# Patient Record
Sex: Female | Born: 1960 | Race: White | Hispanic: No | Smoking: Former smoker
Health system: Southern US, Community
[De-identification: ages and names within clinical notes are randomized; demographics above are authoritative.]

## PROBLEM LIST (undated history)

## (undated) DIAGNOSIS — D0591 Unspecified type of carcinoma in situ of right breast: Secondary | ICD-10-CM

## (undated) DIAGNOSIS — Z803 Family history of malignant neoplasm of breast: Secondary | ICD-10-CM

## (undated) DIAGNOSIS — B182 Chronic viral hepatitis C: Secondary | ICD-10-CM

## (undated) DIAGNOSIS — E119 Type 2 diabetes mellitus without complications: Secondary | ICD-10-CM

## (undated) DIAGNOSIS — I739 Peripheral vascular disease, unspecified: Secondary | ICD-10-CM

## (undated) DIAGNOSIS — Z808 Family history of malignant neoplasm of other organs or systems: Secondary | ICD-10-CM

## (undated) DIAGNOSIS — Z8042 Family history of malignant neoplasm of prostate: Secondary | ICD-10-CM

## (undated) DIAGNOSIS — M5430 Sciatica, unspecified side: Secondary | ICD-10-CM

## (undated) DIAGNOSIS — Z8619 Personal history of other infectious and parasitic diseases: Secondary | ICD-10-CM

## (undated) HISTORY — DX: Peripheral vascular disease, unspecified: I73.9

## (undated) HISTORY — PX: INCISION AND DRAINAGE ABSCESS: SHX5864

## (undated) HISTORY — DX: Family history of malignant neoplasm of breast: Z80.3

## (undated) HISTORY — DX: Family history of malignant neoplasm of prostate: Z80.42

## (undated) HISTORY — DX: Family history of malignant neoplasm of other organs or systems: Z80.8

---

## 2005-10-18 ENCOUNTER — Emergency Department (HOSPITAL_COMMUNITY): Admission: EM | Admit: 2005-10-18 | Discharge: 2005-10-18 | Payer: Self-pay | Admitting: Emergency Medicine

## 2007-12-30 ENCOUNTER — Emergency Department (HOSPITAL_COMMUNITY): Admission: EM | Admit: 2007-12-30 | Discharge: 2007-12-30 | Payer: Self-pay | Admitting: Emergency Medicine

## 2008-03-22 ENCOUNTER — Emergency Department (HOSPITAL_COMMUNITY): Admission: EM | Admit: 2008-03-22 | Discharge: 2008-03-22 | Payer: Self-pay | Admitting: Emergency Medicine

## 2008-04-20 ENCOUNTER — Emergency Department (HOSPITAL_COMMUNITY): Admission: EM | Admit: 2008-04-20 | Discharge: 2008-04-22 | Payer: Self-pay | Admitting: Emergency Medicine

## 2009-01-30 ENCOUNTER — Emergency Department (HOSPITAL_COMMUNITY): Admission: EM | Admit: 2009-01-30 | Discharge: 2009-01-30 | Payer: Self-pay | Admitting: Emergency Medicine

## 2010-08-18 ENCOUNTER — Encounter (HOSPITAL_COMMUNITY): Payer: Self-pay

## 2010-08-18 ENCOUNTER — Emergency Department (HOSPITAL_COMMUNITY): Admit: 2010-08-18 | Discharge: 2010-08-18 | Disposition: A | Discharge status: Home / Self Care | Payer: Self-pay

## 2010-08-18 ENCOUNTER — Emergency Department (HOSPITAL_COMMUNITY)
Admission: EM | Admit: 2010-08-18 | Discharge: 2010-08-18 | Disposition: A | Discharge status: Home / Self Care | Payer: Self-pay | Attending: Emergency Medicine | Admitting: Emergency Medicine

## 2010-08-18 DIAGNOSIS — M545 Low back pain, unspecified: Secondary | ICD-10-CM | POA: Insufficient documentation

## 2010-08-18 DIAGNOSIS — N390 Urinary tract infection, site not specified: Secondary | ICD-10-CM | POA: Insufficient documentation

## 2010-08-18 DIAGNOSIS — F17200 Nicotine dependence, unspecified, uncomplicated: Secondary | ICD-10-CM | POA: Insufficient documentation

## 2010-08-18 LAB — URINALYSIS, ROUTINE W REFLEX MICROSCOPIC
Nitrite: NEGATIVE
Protein, ur: NEGATIVE mg/dL
Specific Gravity, Urine: 1.03 (ref 1.005–1.030)
Urobilinogen, UA: 1 mg/dL (ref 0.0–1.0)

## 2010-08-18 LAB — URINE MICROSCOPIC-ADD ON

## 2010-09-23 ENCOUNTER — Emergency Department (HOSPITAL_COMMUNITY): Payer: Self-pay

## 2010-09-23 ENCOUNTER — Observation Stay (HOSPITAL_COMMUNITY)
Admission: EM | Admit: 2010-09-23 | Discharge: 2010-09-23 | Disposition: A | Discharge status: Home / Self Care | Payer: Self-pay | Attending: Orthopedic Surgery | Admitting: Orthopedic Surgery

## 2010-09-23 DIAGNOSIS — F1410 Cocaine abuse, uncomplicated: Secondary | ICD-10-CM | POA: Insufficient documentation

## 2010-09-23 DIAGNOSIS — Z23 Encounter for immunization: Secondary | ICD-10-CM | POA: Insufficient documentation

## 2010-09-23 DIAGNOSIS — M545 Low back pain, unspecified: Secondary | ICD-10-CM | POA: Insufficient documentation

## 2010-09-23 DIAGNOSIS — F17200 Nicotine dependence, unspecified, uncomplicated: Secondary | ICD-10-CM | POA: Insufficient documentation

## 2010-09-23 DIAGNOSIS — L03119 Cellulitis of unspecified part of limb: Secondary | ICD-10-CM | POA: Insufficient documentation

## 2010-09-23 DIAGNOSIS — G8929 Other chronic pain: Secondary | ICD-10-CM | POA: Insufficient documentation

## 2010-09-23 DIAGNOSIS — L02519 Cutaneous abscess of unspecified hand: Principal | ICD-10-CM | POA: Insufficient documentation

## 2010-09-23 LAB — CBC
MCH: 32.3 pg (ref 26.0–34.0)
MCH: 32 pg (ref 26.0–34.0)
MCHC: 34.2 g/dL (ref 30.0–36.0)
MCV: 94.4 fL (ref 78.0–100.0)
MCV: 94.3 fL (ref 78.0–100.0)
Platelets: 152 10*3/uL (ref 150–400)
Platelets: 164 10*3/uL (ref 150–400)
RBC: 4.64 MIL/uL (ref 3.87–5.11)
RDW: 12.5 % (ref 11.5–15.5)
WBC: 8.6 10*3/uL (ref 4.0–10.5)

## 2010-09-23 LAB — BASIC METABOLIC PANEL
BUN: 5 mg/dL — ABNORMAL LOW (ref 6–23)
CO2: 30 mEq/L (ref 19–32)
Calcium: 8.5 mg/dL (ref 8.4–10.5)
Chloride: 98 mEq/L (ref 96–112)
Creatinine, Ser: 0.59 mg/dL (ref 0.4–1.2)

## 2010-09-23 LAB — DIFFERENTIAL
Eosinophils Absolute: 0.1 10*3/uL (ref 0.0–0.7)
Lymphs Abs: 3 10*3/uL (ref 0.7–4.0)
Monocytes Absolute: 1 10*3/uL (ref 0.1–1.0)
Monocytes Relative: 11 % (ref 3–12)
Neutrophils Relative %: 54 % (ref 43–77)

## 2010-09-25 NOTE — H&P (Signed)
NAME:  Martha Lozano, Martha Lozano NO.:  [id number]  MEDICAL RECORD NO.:  [id number]           PATIENT TYPE:  I  LOCATION:  5024                         FACILITY:  [facility]  PHYSICIAN:  Betha Loa, MD        DATE OF BIRTH:  [birth year]  DATE OF ADMISSION:  09/23/2010 DATE OF DISCHARGE:                             HISTORY & PHYSICAL   CHIEF COMPLAINT:  Left hand pain.  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  She began to have swelling in the hand on September 21, 2010, and began having more and more pain in her left hand approximately 12 hours ago.  She came to the [facility] for evaluation.  She was felt to have an abscess of the hand and I was consulted for management of the injury.  She reports no fevers, chills, or night sweats.  She states she does clean the needle and her skinprior to using IV drugs.  The pain is mostly in the dorsum of the left hand and the location where she had been injecting IV drugs.  ALLERGIES:  No known drug allergies.  PAST MEDICAL HISTORY:  Hepatitis C.  PAST SURGERIES:  None.  FAMILY HISTORY:  Noncontributory.  SOCIAL HISTORY:  Martha Lozano is not employed.  She smokes one-half pack per day x20 years and drinks approximately of 12-pack of beer per week.  MEDICATIONS:  None.  REVIEW OF SYSTEMS:  Negative.  PHYSICAL EXAMINATION:  VITAL SIGNS:  Temperature 98.5, pulse 101, respirations 16, BP 158/99. GENERAL:  Alert and oriented x3. HEAD:  Normocephalic, atraumatic. NECK:  Supple.  Full range of motion. CHEST:  Regular rate and rhythm. LUNGS:  Clear to auscultation bilaterally. ABDOMEN:  Nontender, nondistended. EXTREMITIES:  Bilateral upper extremities are distally and neurovascularly intact in radial, median, and ulnar nerve distributions. She has intact sensation and capillary refill in all  fingertips.  She can flex and extend the IP joint of her thumb across her fingers.  The right upper extremity is without wounds and without tenderness to palpation.  Left upper extremity, she has swelling in the hand.  She is able to move her fingers.  She is tender to palpation on the dorsum of the hand on the ulnar side in the area where she was injecting.  There is fluctuance.  There is mild redness.  She has swelling in the entire dorsum of the hand and somewhat into the fingers.  She can wiggle her fingers.  She is not very tender in her fingers or the palm of the hand. There is no active drainage.  No open wound.  There are track marks.  RADIOGRAPHS:  AP, lateral, and oblique views of left hand show soft tissue swelling, but no fractures, dislocations, or radiopaque foreign bodies.  LAB RESULTS:  White blood count 9.0, hemoglobin 15.0, hematocrit 43.8, platelets 152.  ASSESSMENT/PLAN:  Left hand soft tissue abscess.  Discussed with Martha Lozano the nature of this condition.  I recommended going to the operating room for  irrigation and debridement of the soft tissue abscess and admission for IV antibiotics.  We discussed the wound will be left open and would have packing in it.  It would require hydrotherapy and packing changes.  Risks, benefits, and alternatives of surgery were discussed including risk of blood loss, infection, damage to nerves, vessels, tendons, ligaments, bone, failure of procedure, need for additional procedures, complications with wound healing, continued pain, continued infection, need for repeat irrigation and debridement.  She voiced understanding of these risks and elected to sign surgical consent.  We will have surgery as soon as possible.     Betha Loa, MD     KK/MEDQ  D:  09/23/2010  T:  09/23/2010  Job:  [id number]  Electronically Signed by Betha Loa  on 09/25/2010 05:42:08 PM

## 2010-09-25 NOTE — Discharge Summary (Signed)
  NAME:  Martha Lozano, Martha Lozano NO.:  [id number]  MEDICAL RECORD NO.:  [id number]           PATIENT TYPE:  I  LOCATION:  5024                         FACILITY:  [facility]  PHYSICIAN:  Betha Loa, MD        DATE OF BIRTH:  Sep 08, 1960  DATE OF ADMISSION:  09/23/2010 DATE OF DISCHARGE:  09/23/2010                              DISCHARGE SUMMARY   FINAL DIAGNOSIS:  Left hand abscess.  PROCEDURES:  Left hand irrigation and debridement.  HISTORY:  [name] is a 50 year old right-hand-dominant white female who is an IV heroin user.  She states she was using on Wednesday. She began to have pain and swelling in the left hand on September 21, 2010 and September 22, 2010.  She had continued pain and swelling and presented to the emergency department late on September 22, 2010.  She was felt to have an abscess of the hand.  Was consulted for management of the condition. She was noted have a fluctuant mass in the dorsum of the hand.  There was mild erythema and significant tenderness to palpation.  Recommended going to the operating room for irrigation and debridement of the left hand.  She also had some erythema around some track marks on the right forearm, but there was no abscess noted.  Risks, benefits, alternatives of surgery were discussed and she wished to proceed.  HOSPITAL COURSE:  [name] was taken to the operating room in the early morning hours of September 23, 2010.  Irrigation and debridement of the left dorsal hand abscess was performed.  Purulence was expressed. Cultures were taken.  She tolerated the procedure well.  She was kept overnight for IV antibiotics and continued to improve.  She was afebrile. Her white blood count went from 9.0 to 8.6.  The erythema around the track marks on the right forearm entirely resolved.  She had decreased swelling and tenderness of the left hand.  She had no tenderness volarly and very mild tenderness dorsally.  It was felt she was safe  to discharge home on oral antibiotics.  She will follow up with me in 2 days for further wound care.  She will be given Percocet 5/325 one to two p.o. q.6 h. p.r.n. pain dispensed #50, and Bactrim DS one p.o. b.i.d. x10 days.     Betha Loa, MD     KK/MEDQ  D:  09/23/2010  T:  09/24/2010  Job:  [id number]  Electronically Signed by Betha Loa  on 09/25/2010 05:43:35 PM

## 2010-09-25 NOTE — Op Note (Signed)
NAME:  Martha, Lozano NO.:  [id number]  MEDICAL RECORD NO.:  [id number]           PATIENT TYPE:  I  LOCATION:  5024                         FACILITY:  [facility]  PHYSICIAN:  Betha Loa, MD        DATE OF BIRTH:  [birth year]  DATE OF PROCEDURE: DATE OF DISCHARGE:                              OPERATIVE REPORT   PREOPERATIVE DIAGNOSIS:  Left hand abscess.  POSTOPERATIVE DIAGNOSIS:  Left hand abscess.  PROCEDURE:  Irrigation and debridement, left hand, dorsal abscess.  SURGEON:  Betha Loa, MD.  ASSISTANT:  None.  ANESTHESIA:  General.  IV FLUIDS:  Per anesthesia flow sheet.  ESTIMATED BLOOD LOSS:  Minimal.  COMPLICATIONS:  None.  SPECIMENS:  Cultures to micro.  Tourniquet time approximately 28 minutes.  DISPOSITION:  Stable to PACU.  INDICATIONS:  Martha Lozano is a 50 year old right-hand-dominant white female who states she was injecting IV heroin in her left hand on Wednesday.  She began to have pain and swelling in her left hand.  She presented to [facility] Emergency Department late in the evening of March 9.  She was evaluated and felt to have an abscess of the hand.  I was consulted for management of condition.  She reports no fevers, chills, night sweats.  She had a palpable fluctuant area on the dorsum of the hand.  It was minimally erythematous.  The hand was swollen throughout.  She was not tender volarly in the hand.  I recommended Ms. Altland going to the operating room for irrigation debridement, left hand.  Risks, benefits, and alternatives of surgery were discussed including risk of blood loss, infection, damage to nerves, vessels, tendons, ligaments bone, failure to surgery, need for additional surgery, complications with wound healing, continued pain, continued infection, need for repeat irrigation and debridement.  She voiced understanding of these risks and elected to proceed.  OPERATIVE COURSE:  After being identified  preoperatively by myself, the patient agreed upon procedure and site procedure.  Surgery site was marked.  The risks, benefits, and alternatives of surgery were reviewed and she wished to proceed.  Surgical consent was signed.  She was transferred to the operating room, placed on the operating table in supine position with left upper extremity on arm board.  General anesthesia was induced by the anesthesiologist.  Left upper extremity was prepped and draped in normal sterile orthopedic fashion.  A surgical pause was performed between surgeons, Anesthesia, and operating staff, and all were in agreement with the patient procedure and site procedure. Tourniquet proximal aspect of the extremities inflated to 250 mmHg after gravity exsanguination of the limb.  Incision was made in the dorsum of the hand between the long and ring finger metacarpals.  This carried into subcutaneous tissues by spreading technique.  Bipolar electrocautery was used throughout the case to obtain hemostasis.  Gross purulence was expressed.  Cultures were taken for aerobes and anaerobes. The wound was spread open.  The tendons were identified and were intact. The wound was copiously irrigated with 4000 mL of sterile saline.  It was felt that all aspects of the abscess pocket had been cleared.  The area underneath the tendons did not have any active abscess in it.  The fascia over the metacarpals was intact and there was no bulging.  Once adequate debridement and irrigation had been obtained, the wound was packed with 0.25 inch iodoform packing.  A 3 mL of 0.25% plain Marcaine were used in the skin edges for postoperative analgesia.  The wound was dressed with sterile Xeroform, 4x4s, and wrapped with Kerlix.  A volar splint was placed and wrapped with Kerlix and Ace bandage.  Tourniquet was deflated to approximately 28 minutes.  The fingertips were pink with brisk capillary refill after deflation of tourniquet.  The  upper drapes were broken down.  The patient was awoken from anesthesia safely.  She was transferred back to stretcher and taken to PACU in stable condition. We will keep her overnight for IV antibiotics.  I will see her tomorrow for evaluation.  We will plan to start whirlpool and packing changes in the next couple of days.     Betha Loa, MD     KK/MEDQ  D:  09/23/2010  T:  09/23/2010  Job:  [id number]  Electronically Signed by Betha Loa  on 09/25/2010 05:43:02 PM

## 2010-09-26 LAB — CULTURE, ROUTINE-ABSCESS

## 2010-09-28 LAB — ANAEROBIC CULTURE

## 2010-10-22 LAB — URINALYSIS, ROUTINE W REFLEX MICROSCOPIC
Ketones, ur: NEGATIVE mg/dL
Leukocytes, UA: NEGATIVE
Nitrite: NEGATIVE
Protein, ur: NEGATIVE mg/dL
Urobilinogen, UA: 0.2 mg/dL (ref 0.0–1.0)

## 2010-10-22 LAB — CBC
MCHC: 35.1 g/dL (ref 30.0–36.0)
RBC: 4.89 MIL/uL (ref 3.87–5.11)

## 2010-10-22 LAB — BASIC METABOLIC PANEL
CO2: 31 mEq/L (ref 19–32)
Calcium: 9.2 mg/dL (ref 8.4–10.5)
Creatinine, Ser: 0.64 mg/dL (ref 0.4–1.2)
GFR calc Af Amer: >60 mL/min (ref >60)

## 2010-10-22 LAB — DIFFERENTIAL
Basophils Absolute: 0 10*3/uL (ref 0.0–0.1)
Basophils Relative: 0 % (ref 0–1)
Monocytes Relative: 9 % (ref 3–12)
Neutro Abs: 4.7 10*3/uL (ref 1.7–7.7)
Neutrophils Relative %: 49 % (ref 43–77)

## 2010-10-22 LAB — URINE CULTURE: Colony Count: 30000

## 2010-12-26 ENCOUNTER — Emergency Department (HOSPITAL_COMMUNITY)
Admission: EM | Admit: 2010-12-26 | Discharge: 2010-12-27 | Disposition: A | Discharge status: Home / Self Care | Payer: Self-pay | Attending: Emergency Medicine | Admitting: Emergency Medicine

## 2010-12-26 DIAGNOSIS — B1920 Unspecified viral hepatitis C without hepatic coma: Secondary | ICD-10-CM | POA: Insufficient documentation

## 2010-12-26 DIAGNOSIS — IMO0002 Reserved for concepts with insufficient information to code with codable children: Secondary | ICD-10-CM | POA: Insufficient documentation

## 2010-12-28 ENCOUNTER — Emergency Department (HOSPITAL_COMMUNITY)
Admission: EM | Admit: 2010-12-28 | Discharge: 2010-12-28 | Disposition: A | Discharge status: Home / Self Care | Payer: Self-pay | Attending: Emergency Medicine | Admitting: Emergency Medicine

## 2010-12-28 DIAGNOSIS — Z79899 Other long term (current) drug therapy: Secondary | ICD-10-CM | POA: Insufficient documentation

## 2010-12-28 DIAGNOSIS — Z8619 Personal history of other infectious and parasitic diseases: Secondary | ICD-10-CM | POA: Insufficient documentation

## 2010-12-28 DIAGNOSIS — IMO0002 Reserved for concepts with insufficient information to code with codable children: Secondary | ICD-10-CM | POA: Insufficient documentation

## 2010-12-28 DIAGNOSIS — Z4800 Encounter for change or removal of nonsurgical wound dressing: Secondary | ICD-10-CM | POA: Insufficient documentation

## 2010-12-30 LAB — CULTURE, ROUTINE-ABSCESS

## 2011-01-02 LAB — CULTURE, BLOOD (ROUTINE X 2)
Culture  Setup Time: 201206130359
Culture: NO GROWTH

## 2011-04-11 ENCOUNTER — Inpatient Hospital Stay (INDEPENDENT_AMBULATORY_CARE_PROVIDER_SITE_OTHER)
Admission: RE | Admit: 2011-04-11 | Discharge: 2011-04-11 | Disposition: A | Discharge status: Home / Self Care | Payer: Self-pay | Source: Ambulatory Visit | Attending: Family Medicine | Admitting: Family Medicine

## 2011-04-11 DIAGNOSIS — M545 Low back pain: Secondary | ICD-10-CM

## 2011-04-11 DIAGNOSIS — J40 Bronchitis, not specified as acute or chronic: Secondary | ICD-10-CM

## 2011-04-11 LAB — POCT URINALYSIS DIP (DEVICE)
Nitrite: NEGATIVE
Protein, ur: NEGATIVE mg/dL
Urobilinogen, UA: 2 mg/dL — ABNORMAL HIGH (ref 0.0–1.0)
pH: 6.5 (ref 5.0–8.0)

## 2011-04-12 LAB — GC/CHLAMYDIA PROBE AMP, GENITAL: GC Probe Amp, Genital: NEGATIVE

## 2011-07-06 IMAGING — CR DG CHEST 2V
2 series · 2 of 2 positions shown · non-contrast
Comparison: None.

CLINICAL DATA: Left hand abscess

CHEST - 2 VIEW

[w chest pa]
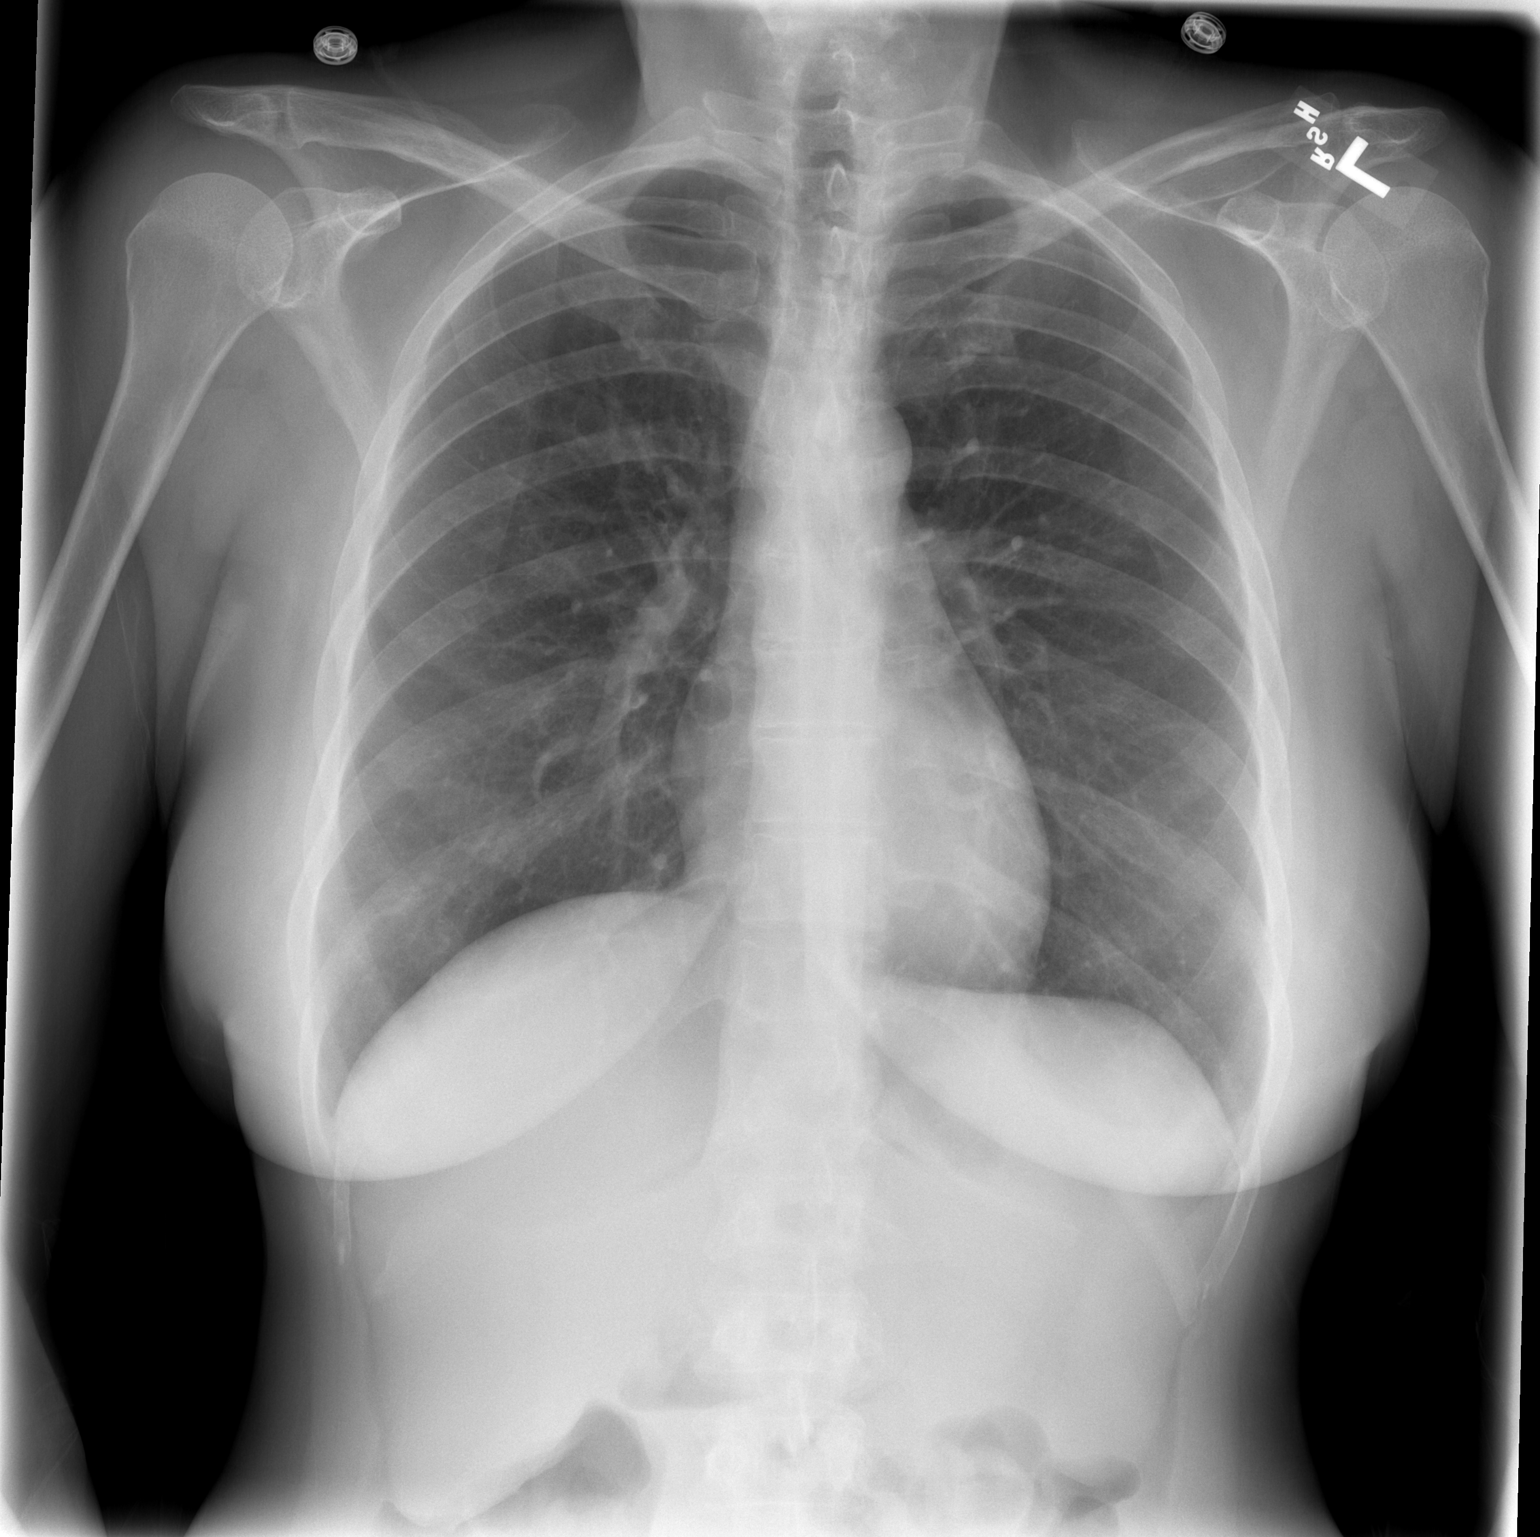

[w chest lat]
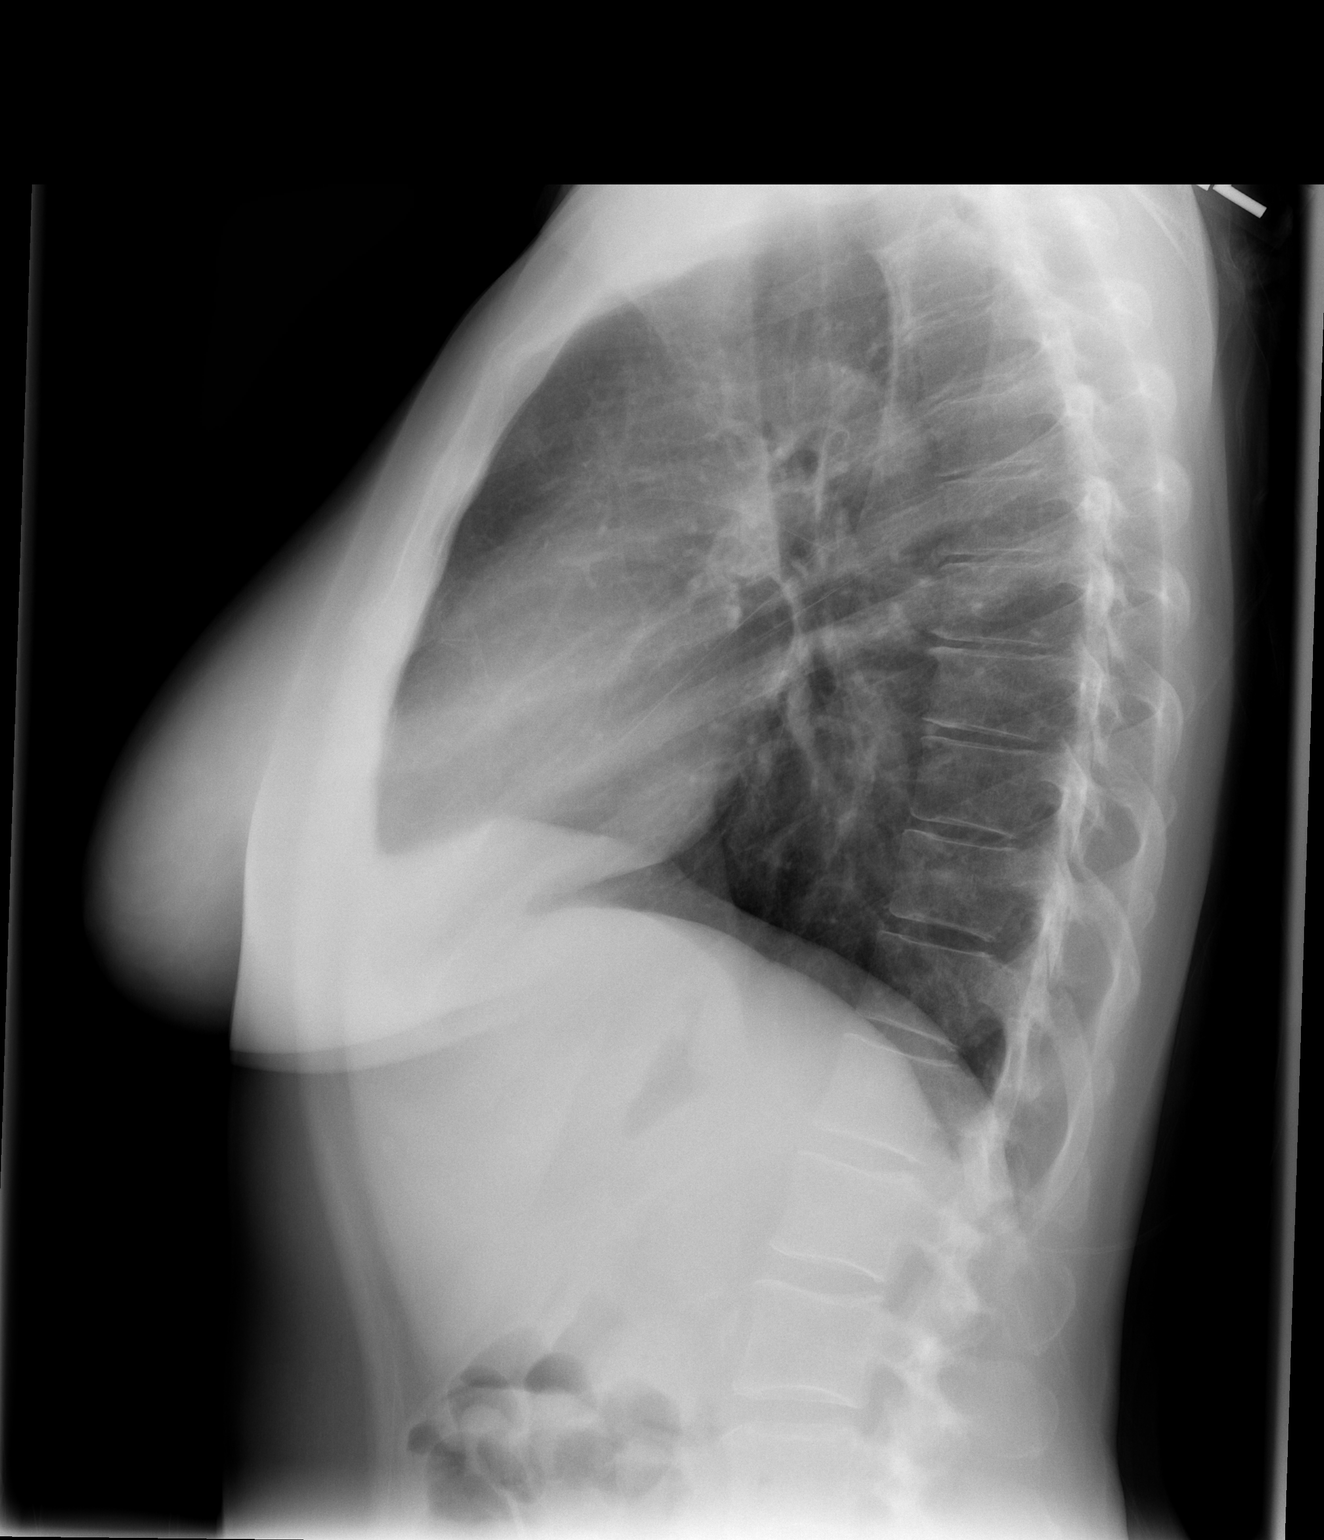

[2 of 2 positions shown; findings below may reference images not displayed]

FINDINGS: Normal mediastinum and cardiac silhouette.  Normal
pulmonary  vasculature.  No evidence of effusion, infiltrate, or
pneumothorax.  No acute bony abnormality.
IMPRESSION: Normal chest radiograph

## 2011-07-06 IMAGING — CR DG HAND COMPLETE 3+V*L*
3 series · 3 of 3 positions shown · non-contrast
Comparison: None.

CLINICAL DATA: Left hand abscess

LEFT HAND - COMPLETE 3+ VIEW

[x hand pa left]
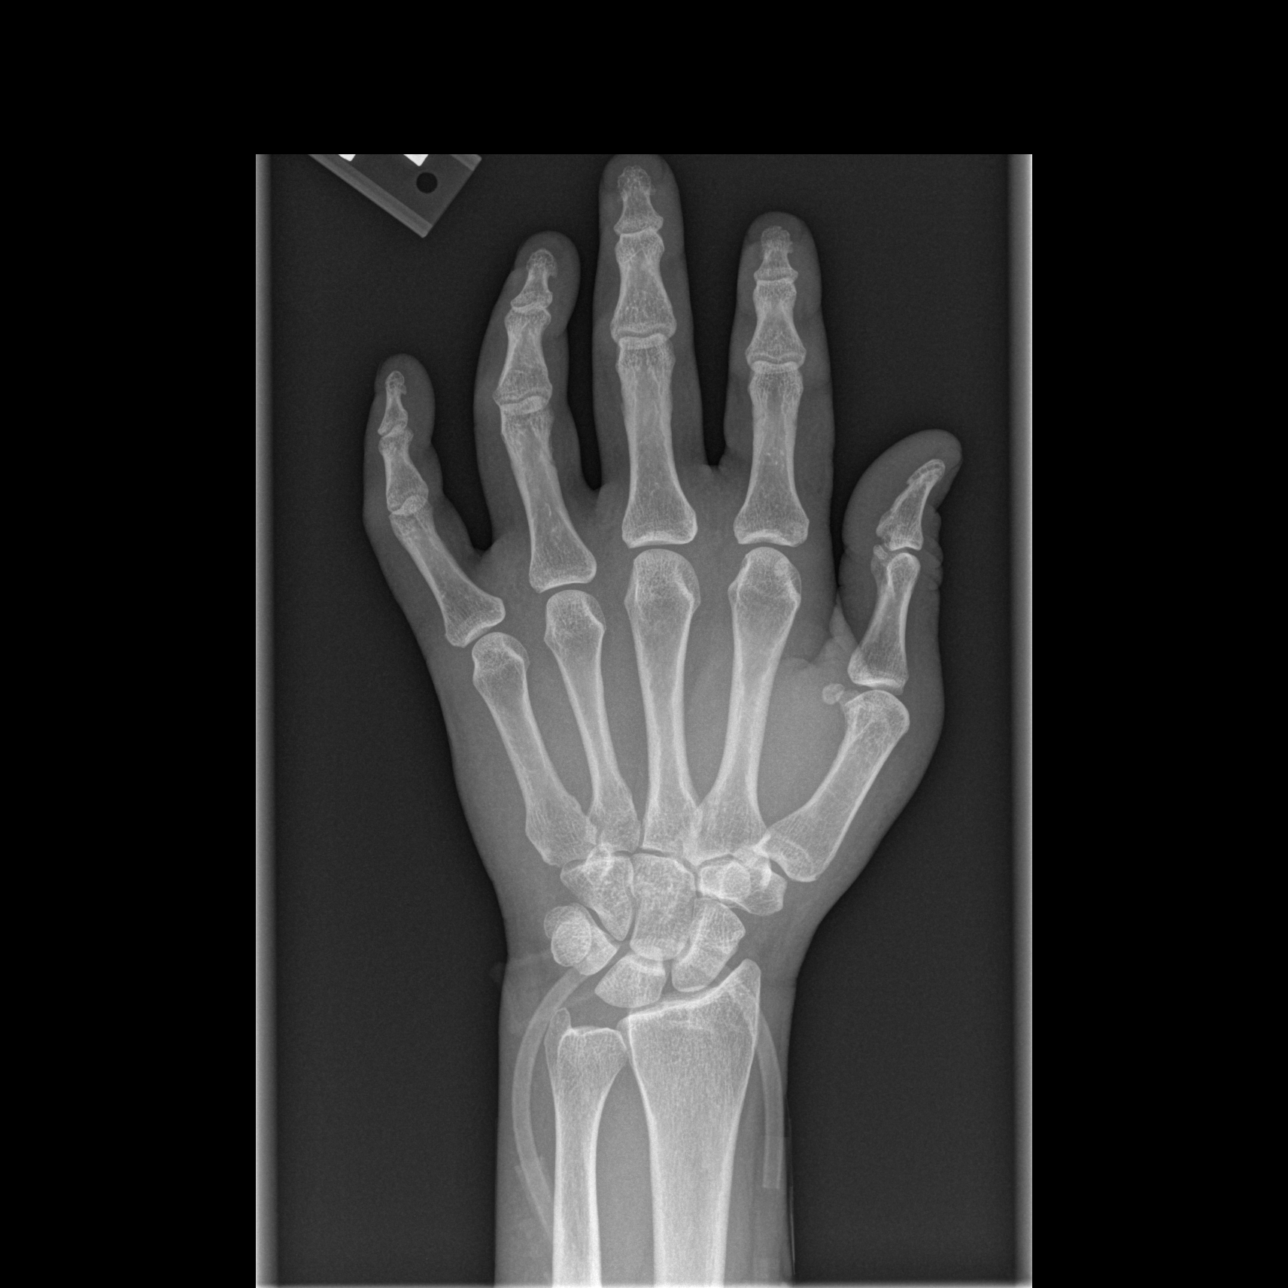

[x hand oblique left]
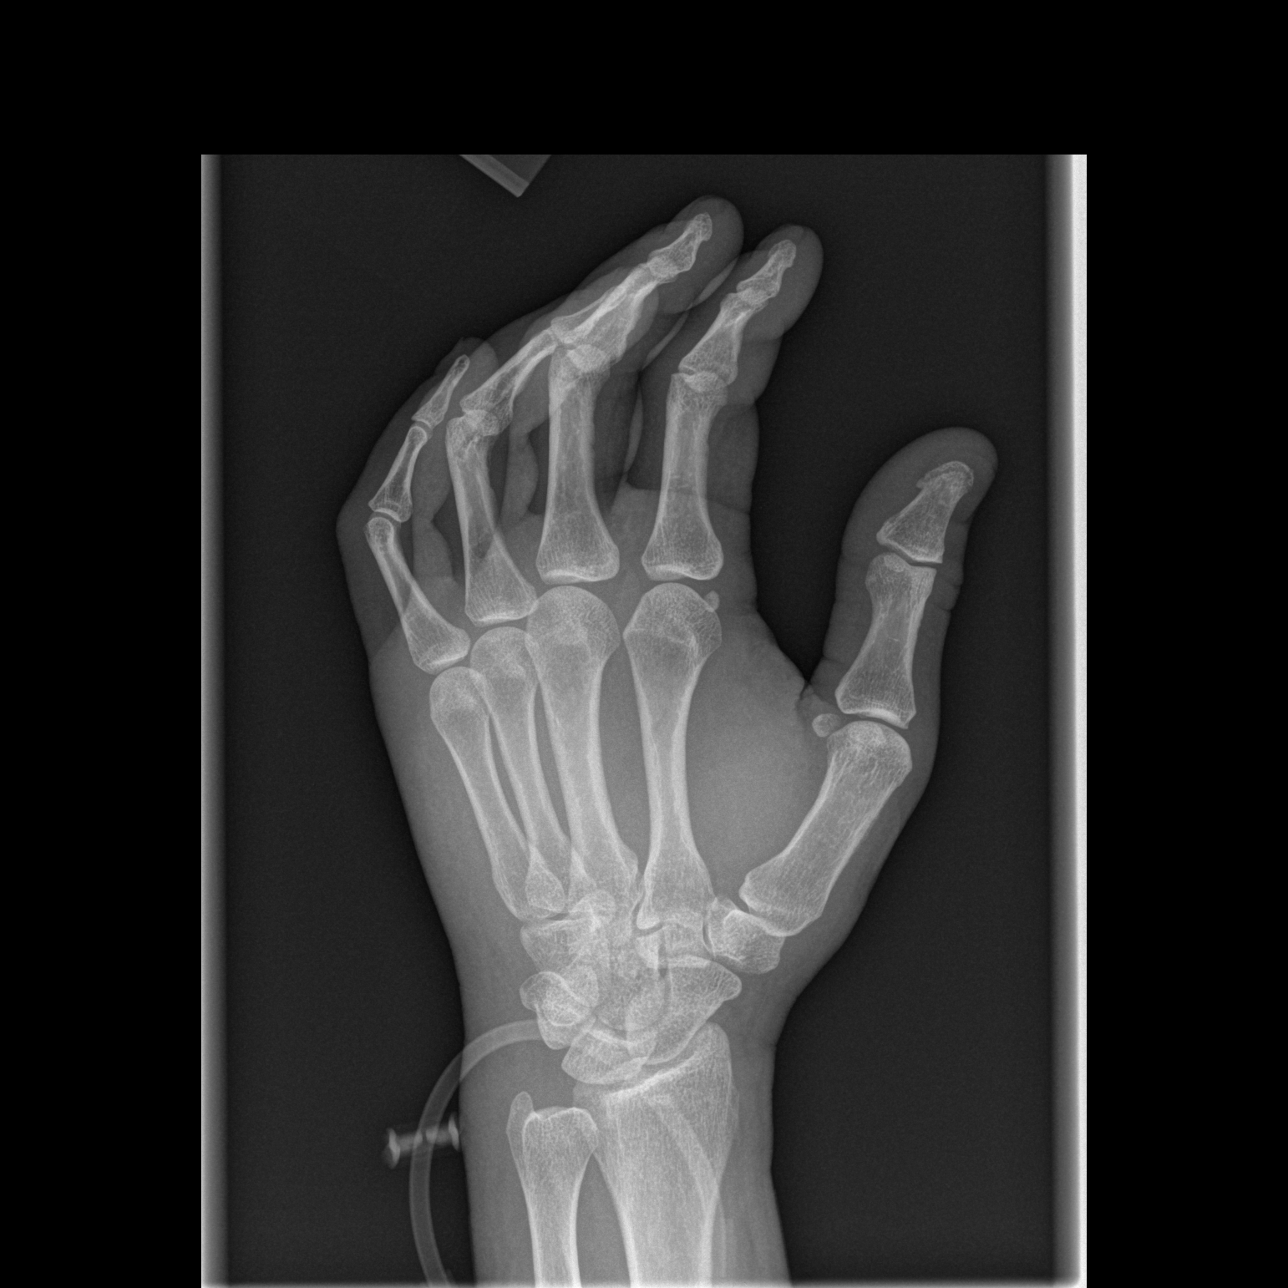

[x hand lat left]
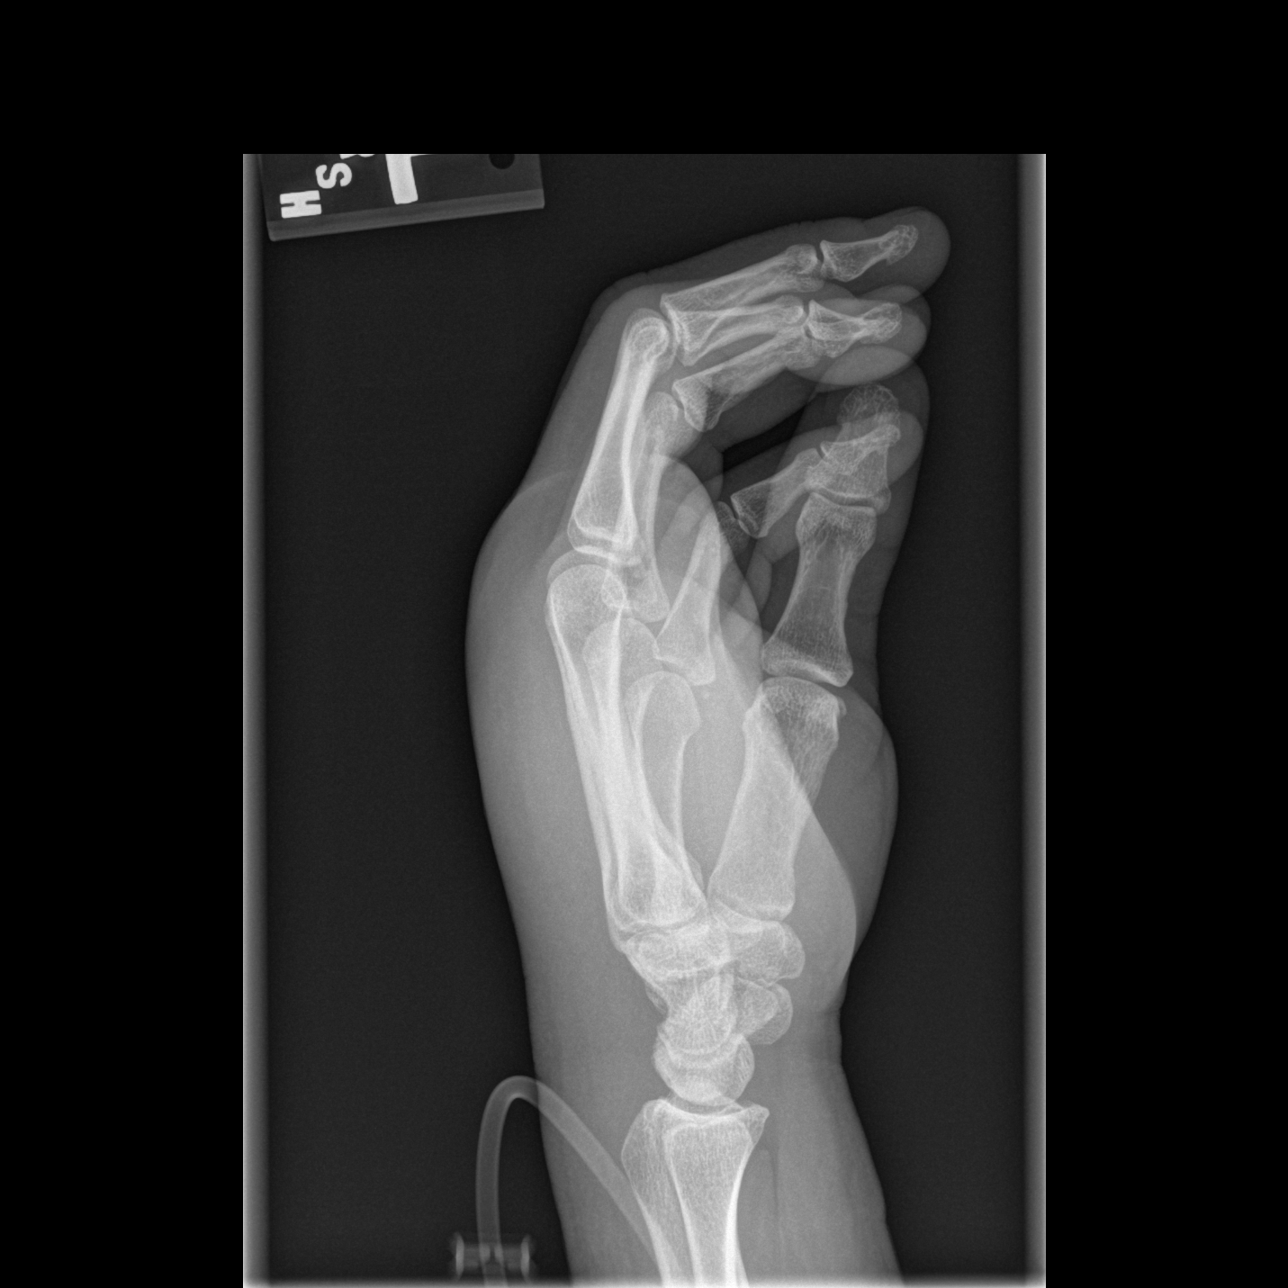

[3 of 3 positions shown; findings below may reference images not displayed]

FINDINGS: No osseous abnormality.  No radiodense foreign body.
There is soft tissue swelling of the dorsum of the hand
IMPRESSION: Soft tissue swelling of the dorsum of the hand.

## 2011-09-27 ENCOUNTER — Emergency Department (HOSPITAL_COMMUNITY)
Admission: EM | Admit: 2011-09-27 | Discharge: 2011-09-27 | Disposition: A | Discharge status: Home / Self Care | Payer: Self-pay | Attending: Emergency Medicine | Admitting: Emergency Medicine

## 2011-09-27 ENCOUNTER — Encounter (HOSPITAL_COMMUNITY): Payer: Self-pay

## 2011-09-27 DIAGNOSIS — G8929 Other chronic pain: Secondary | ICD-10-CM | POA: Insufficient documentation

## 2011-09-27 DIAGNOSIS — W57XXXA Bitten or stung by nonvenomous insect and other nonvenomous arthropods, initial encounter: Secondary | ICD-10-CM

## 2011-09-27 DIAGNOSIS — B182 Chronic viral hepatitis C: Secondary | ICD-10-CM | POA: Insufficient documentation

## 2011-09-27 DIAGNOSIS — M545 Low back pain, unspecified: Secondary | ICD-10-CM | POA: Insufficient documentation

## 2011-09-27 DIAGNOSIS — F17200 Nicotine dependence, unspecified, uncomplicated: Secondary | ICD-10-CM | POA: Insufficient documentation

## 2011-09-27 DIAGNOSIS — R21 Rash and other nonspecific skin eruption: Secondary | ICD-10-CM | POA: Insufficient documentation

## 2011-09-27 DIAGNOSIS — IMO0001 Reserved for inherently not codable concepts without codable children: Secondary | ICD-10-CM | POA: Insufficient documentation

## 2011-09-27 DIAGNOSIS — M5380 Other specified dorsopathies, site unspecified: Secondary | ICD-10-CM | POA: Insufficient documentation

## 2011-09-27 HISTORY — DX: Chronic viral hepatitis C: B18.2

## 2011-09-27 MED ORDER — CYCLOBENZAPRINE HCL 10 MG PO TABS: 10.0000 mg | ORAL_TABLET | Freq: Two times a day (BID) | ORAL | Status: AC | PRN

## 2011-09-27 MED ORDER — HYDROCODONE-ACETAMINOPHEN 5-325 MG PO TABS: 1.0000 | ORAL_TABLET | Freq: Four times a day (QID) | ORAL | Status: AC | PRN

## 2011-09-27 MED ORDER — PERMETHRIN 5 % EX CREA: TOPICAL_CREAM | CUTANEOUS | Status: AC

## 2011-09-27 MED ORDER — HYDROCODONE-ACETAMINOPHEN 5-325 MG PO TABS: 1.0000 | ORAL_TABLET | Freq: Once | ORAL | Status: DC

## 2011-09-27 NOTE — ED Provider Notes (Signed)
History     CSN: [id number]  Arrival date & time 09/27/11  4098   First MD Initiated Contact with Patient 09/27/11 0919      Chief Complaint  Patient presents with  . Back Pain    (Consider location/radiation/quality/duration/timing/severity/associated sxs/prior treatment) HPI  Patient presents to the emergency department with a flareup of chronic lumbar back pain. Patient has a history of slipped disc which she was told she needed surgery for approximately 5 years ago. Patient states she no months without having any symptoms. Pt states that this episode started two weeks ago. This pain is the same as her usual flair up. She can not afford to have surgery. Pt is ambulatory but the pain is still severe. Patient denies generalized or focal weakness, bowel incontinence, urinary symptoms or urinary incontinence. Patient also complains of rash to right arm and states that her arrhythmia was diagnosed with bedbugs one week ago. She denies having a rash on any other part of her body. She denies fevers, chills, N/V/D, CP, SOB.  Past Medical History  Diagnosis Date  . Hep C w/o coma, chronic     History reviewed. No pertinent past surgical history.  History reviewed. No pertinent family history.  History  Substance Use Topics  . Smoking status: Current Everyday Smoker  . Smokeless tobacco: Not on file  . Alcohol Use: No    OB History    Grav Para Term Preterm Abortions TAB SAB Ect Mult Living                  Review of Systems  All other systems reviewed and are negative.    Allergies  Review of patient's allergies indicates no known allergies.  Home Medications  No current outpatient prescriptions on file.  BP 141/95  Pulse 92  Temp(Src) 100.9 F (38.3 C) (Oral)  Resp 15  SpO2 95%  LMP 08/23/2011  Physical Exam  Nursing note and vitals reviewed. Constitutional: She is oriented to person, place, and time. She appears well-developed and well-nourished. No distress.   HENT:  Head: Normocephalic and atraumatic.  Eyes: Pupils are equal, round, and reactive to light.  Neck: Normal range of motion. Neck supple.  Cardiovascular: Normal rate and regular rhythm.   Pulmonary/Chest: Effort normal.  Abdominal: Soft.  Musculoskeletal:       Lumbar back: She exhibits tenderness, pain and spasm. She exhibits normal range of motion, no bony tenderness, no swelling, no edema, no deformity, no laceration and normal pulse.       The patient has full range of motion and equal strength in both legs bilaterally. No sensory function remains intact.  Neurological: She is oriented to person, place, and time.  Skin: Skin is warm and dry. Rash noted. Rash is papular.          Patient has diffuse rash to area as illustrated on the left posterior and anterior forearm. All lesions have scabbed over and are in the process of healing without signs of infection, purulent drainage, erythema, induration or discharge.    ED Course  Procedures (including critical care time)  Labs Reviewed - No data to display No results found.   1. Chronic back pain   2. Bug bites       MDM  I have recommended patient followup with orthopedist for her chronic back pain. Pt can return to the ED if her rash worsens or failed to resolve. Patient has been looked up on the drug database and her last  prescription for narcotic was on 09/30/2010. Prescribe short course of muscle relaxers and pain medications. Flexeril and Percocets (12 tabs).   Pt has been advised of the symptoms that warrant their return to the ED. Patient has voiced understanding and has agreed to follow-up with the PCP or specialist.      Dorthula Matas, PA 09/27/11 0949  Dorthula Matas, PA 09/27/11 [phone]

## 2011-09-27 NOTE — ED Notes (Signed)
Pt with c/o lumbar pain, flaired yesterday, hurts to breathe, red bumps to right upper arm started 2 weeks ago

## 2011-09-27 NOTE — ED Provider Notes (Signed)
Medical screening examination/treatment/procedure(s) were performed by non-physician practitioner and as supervising physician I was immediately available for consultation/collaboration.   Dione Booze, MD 09/27/11 [phone]

## 2011-09-27 NOTE — Discharge Instructions (Signed)
Back Exercises Back exercises help treat and prevent back injuries. The goal of back exercises is to increase the strength of your abdominal and back muscles and the flexibility of your back. These exercises should be started when you no longer have back pain. Back exercises include:  Pelvic Tilt. Lie on your back with your knees bent. Tilt your pelvis until the lower part of your back is against the floor. Hold this position 5 to 10 sec and repeat 5 to 10 times.   Knee to Chest. Pull first 1 knee up against your chest and hold for 20 to 30 seconds, repeat this with the other knee, and then both knees. This may be done with the other leg straight or bent, whichever feels better.   Sit-Ups or Curl-Ups. Bend your knees 90 degrees. Start with tilting your pelvis, and do a partial, slow sit-up, lifting your trunk only 30 to 45 degrees off the floor. Take at least 2 to 3 seconds for each sit-up. Do not do sit-ups with your knees out straight. If partial sit-ups are difficult, simply do the above but with only tightening your abdominal muscles and holding it as directed.   Hip-Lift. Lie on your back with your knees flexed 90 degrees. Push down with your feet and shoulders as you raise your hips a couple inches off the floor; hold for 10 seconds, repeat 5 to 10 times.   Back arches. Lie on your stomach, propping yourself up on bent elbows. Slowly press on your hands, causing an arch in your low back. Repeat 3 to 5 times. Any initial stiffness and discomfort should lessen with repetition over time.   Shoulder-Lifts. Lie face down with arms beside your body. Keep hips and torso pressed to floor as you slowly lift your head and shoulders off the floor.  Do not overdo your exercises, especially in the beginning. Exercises may cause you some mild back discomfort which lasts for a few minutes; however, if the pain is more severe, or lasts for more than 15 minutes, do not continue exercises until you see your  caregiver. Improvement with exercise therapy for back problems is slow.  See your caregivers for assistance with developing a proper back exercise program. Document Released: 08/09/2004 Document Revised: 06/21/2011 Document Reviewed: 07/02/2005 [facility] Patient Information 2012 [place], [place].Bedbugs Bedbugs are tiny bugs that live in and around beds. During the day, they hide in mattresses and other places near beds. They come out at night and bite people lying in bed. They need blood to live and grow. Bedbugs can be found in beds anywhere. Usually, they are found in places where many people come and go (hotels, shelters, hospitals). It does not matter whether the place is dirty or clean. Getting bitten by bedbugs rarely causes a medical problem. The biggest problem can be getting rid of them. This often takes the work of a Oncologist. CAUSES  Less use of pesticides. Bedbugs were common before the 1950s. Then, strong pesticides such as DDT nearly wiped them out. Today, these pesticides are not used because they harm the environment and can cause health problems.   More travel. Besides mattresses, bedbugs can also live in clothing and luggage. They can come along as people travel from place to place. Bedbugs are more common in certain parts of the world. When people travel to those areas, the bugs can come home with them.   Presence of birds and bats. Bedbugs often infest birds and bats. If you have  these animals in or near your home, bedbugs may infest your house, too.  SYMPTOMS It does not hurt to be bitten by a bedbug. You will probably not wake up when you are bitten. Bedbugs usually bite areas of the skin that are not covered. Symptoms may show when you wake up, or they may take a day or more to show up. Symptoms may include:  Small red bumps on the skin. These might be lined up in a row or clustered in a group.   A darker red dot in the middle of red bumps.   Blisters on the  skin. There may be swelling and very bad itching. These may be signs of an allergic reaction. This does not happen often.  DIAGNOSIS Bedbug bites might look and feel like other types of insect bites. The bugs do not stay on the body like ticks or lice. They bite, drop off, and crawl away to hide. Your caregiver will probably:  Ask about your symptoms.   Ask about your recent activities and travel.   Check your skin for bedbug bites.   Ask you to check at home for signs of bedbugs. You should look for:   Spots or stains on the bed or nearby. This could be from bedbugs that were crushed or from their eggs or waste.   Bedbugs themselves. They are reddish-brown, oval, and flat. They do not fly. They are about the size of an apple seed.   Places to look for bedbugs include:   Beds. Check mattresses, headboards, box springs, and bed frames.   On drapes and curtains near the bed.   Under carpeting in the bedroom.   Behind electrical outlets.   Behind any wallpaper that is peeling.   Inside luggage.  TREATMENT Most bedbug bites do not need treatment. They usually go away on their own in a few days. The bites are not dangerous. However, treatment may be needed if you have scratched so much that your skin has become infected. You may also need treatment if you are allergic to bedbug bites. Treatment options include:  A drug that stops swelling and itching (corticosteroid). Usually, a cream is rubbed on the skin. If you have a bad rash, you may be given a corticosteroid pill.   Oral antihistamines. These are pills to help control itching.   Antibiotic medicines. An antibiotic may be prescribed for infected skin.  HOME CARE INSTRUCTIONS   Take any medicine prescribed by your caregiver for your bites. Follow the directions carefully.   Consider wearing pajamas with long sleeves and pant legs.   Your bedroom may need to be treated. A pest control expert should make sure the bedbugs are  gone. You may need to throw away mattresses or luggage. Ask the pest control expert what you can do to keep the bedbugs from coming back. Common suggestions include:   Putting a plastic cover over your mattress.   Washing and drying your clothes and bedding in hot water and a hot dryer. The temperature should be hotter than 120 F (48.9 C). Bedbugs are killed by high temperatures.   Vacuuming carefully all around your bed. Vacuum in all cracks and crevices where the bugs might hide. Do this often.   Carefully checking all used furniture, bedding, or clothes that you bring into your house.   Eliminating bird nests and bat roosts.   If you get bedbug bites when traveling, check all your possessions carefully before bringing them into your house.  If you find any bugs on clothes or in your luggage, consider throwing those items away.  SEEK MEDICAL CARE IF:  You have red bug bites that keep coming back.   You have red bug bites that itch badly.   You have bug bites that cause a skin rash.   You have scratch marks that are red and sore.  SEEK IMMEDIATE MEDICAL CARE IF: You have a fever. Document Released: 08/04/2010 Document Revised: 06/21/2011 Document Reviewed: 08/04/2010 [facility] Patient Information 2012 [place], [place].Chronic Back Pain When back pain lasts longer than 3 months, it is called chronic back pain.This pain can be frustrating, but the cause of the pain is rarely dangerous.People with chronic back pain often go through certain periods that are more intense (flare-ups). CAUSES Chronic back pain can be caused by wear and tear (degeneration) on different structures in your back. These structures may include bones, ligaments, or discs. This degeneration may result in more pressure being placed on the nerves that travel to your legs and feet. This can lead to pain traveling from the low back down the back of the legs. When pain lasts longer than 3 months, it is not unusual for  people to experience anxiety or depression. Anxiety and depression can also contribute to low back pain. TREATMENT  Establish a regular exercise plan. This is critical to improving your functional level.   Have a self-management plan for when you flare-up. Flare-ups rarely require a medical visit. Regular exercise will help reduce the intensity and frequency of your flare-ups.   Manage how you feel about your back pain and the rest of your life. Anxiety, depression, and feeling that you cannot alter your back pain have been shown to make back pain more intense and debilitating.   Medicines should never be your only treatment. They should be used along with other treatments to help you return to a more active lifestyle.   Procedures such as injections or surgery may be helpful but are rarely necessary. You may be able to get the same results with physical therapy or chiropractic care.  HOME CARE INSTRUCTIONS  Avoid bending, heavy lifting, prolonged sitting, and activities which make the problem worse.   Continue normal activity as much as possible.   Take brief periods of rest throughout the day to reduce your pain during flare-ups.   Follow your back exercise rehabilitation program. This can help reduce symptoms and prevent more pain.   Only take over-the-counter or prescription medicines as directed by your caregiver. Muscle relaxants are sometimes prescribed. Narcotic pain medicine is discouraged for long-term pain, since addiction is a possible outcome.   If you smoke, quit.   Eat healthy foods and maintain a recommended body weight.  SEEK IMMEDIATE MEDICAL CARE IF:   You have weakness or numbness in one of your legs or feet.   You have trouble controlling your bladder or bowels.   You develop nausea, vomiting, abdominal pain, shortness of breath, or fainting.  Document Released: 08/09/2004 Document Revised: 06/21/2011 Document Reviewed: 06/16/2011 [facility] Patient Information  2012 [place], [place].

## 2012-12-23 ENCOUNTER — Emergency Department (HOSPITAL_COMMUNITY): Payer: Self-pay

## 2012-12-23 ENCOUNTER — Emergency Department (HOSPITAL_COMMUNITY)
Admission: EM | Admit: 2012-12-23 | Discharge: 2012-12-23 | Disposition: A | Discharge status: Home / Self Care | Payer: Self-pay | Attending: Emergency Medicine | Admitting: Emergency Medicine

## 2012-12-23 ENCOUNTER — Encounter (HOSPITAL_COMMUNITY): Payer: Self-pay | Admitting: Emergency Medicine

## 2012-12-23 DIAGNOSIS — R0602 Shortness of breath: Secondary | ICD-10-CM | POA: Insufficient documentation

## 2012-12-23 DIAGNOSIS — F17200 Nicotine dependence, unspecified, uncomplicated: Secondary | ICD-10-CM | POA: Insufficient documentation

## 2012-12-23 DIAGNOSIS — R0789 Other chest pain: Secondary | ICD-10-CM | POA: Insufficient documentation

## 2012-12-23 DIAGNOSIS — Z79899 Other long term (current) drug therapy: Secondary | ICD-10-CM | POA: Insufficient documentation

## 2012-12-23 DIAGNOSIS — Z23 Encounter for immunization: Secondary | ICD-10-CM | POA: Insufficient documentation

## 2012-12-23 DIAGNOSIS — K219 Gastro-esophageal reflux disease without esophagitis: Secondary | ICD-10-CM | POA: Insufficient documentation

## 2012-12-23 DIAGNOSIS — B182 Chronic viral hepatitis C: Secondary | ICD-10-CM | POA: Insufficient documentation

## 2012-12-23 LAB — POCT I-STAT TROPONIN I: Troponin i, poc: 0.01 ng/mL (ref 0.00–0.08)

## 2012-12-23 LAB — BASIC METABOLIC PANEL
CO2: 30 mEq/L (ref 19–32)
Calcium: 8.8 mg/dL (ref 8.4–10.5)
Creatinine, Ser: 0.63 mg/dL (ref 0.50–1.10)
GFR calc non Af Amer: >90 mL/min (ref >90)

## 2012-12-23 LAB — RAPID URINE DRUG SCREEN, HOSP PERFORMED
Amphetamines: NOT DETECTED
Barbiturates: NOT DETECTED
Benzodiazepines: NOT DETECTED

## 2012-12-23 LAB — CBC
MCH: 32.1 pg (ref 26.0–34.0)
MCHC: 33.8 g/dL (ref 30.0–36.0)
MCV: 95 fL (ref 78.0–100.0)
Platelets: 134 10*3/uL — ABNORMAL LOW (ref 150–400)
RBC: 4.76 MIL/uL (ref 3.87–5.11)

## 2012-12-23 LAB — TROPONIN I: Troponin I: <0.3 ng/mL (ref <0.30)

## 2012-12-23 MED ORDER — OMEPRAZOLE MAGNESIUM 20 MG PO TBEC: 20.0000 mg | DELAYED_RELEASE_TABLET | Freq: Every day | ORAL | Status: DC

## 2012-12-23 MED ORDER — NITROGLYCERIN 0.4 MG SL SUBL: 0.4000 mg | SUBLINGUAL_TABLET | SUBLINGUAL | Status: DC | PRN

## 2012-12-23 MED ORDER — TETANUS-DIPHTH-ACELL PERTUSSIS 5-2.5-18.5 LF-MCG/0.5 IM SUSP
0.5000 mL | Freq: Once | INTRAMUSCULAR | Status: AC
  Administered 2012-12-23: 0.5 mL via INTRAMUSCULAR
  Filled 2012-12-23 (×2): qty 0.5

## 2012-12-23 NOTE — ED Notes (Signed)
Pt c/o mid sternal CP worse with cough and SOB x 2 days; pt sts cough and takes methadone

## 2012-12-23 NOTE — ED Provider Notes (Signed)
History     CSN: [id number] Arrival date & time 12/23/12  1025 First MD Initiated Contact with Patient 12/23/12 1152      Chief Complaint  Patient presents with  . Chest Pain  . Shortness of Breath   Patient is a 52 y.o. female presenting with chest pain and shortness of breath.  Chest Pain Associated symptoms: shortness of breath   Shortness of Breath Associated symptoms: chest pain     52 year old smoker with no other known risk factors for CAD presenting with Chest Pain.   Patient states for 2-3 days she has felt mildly SOB if she walked briskly. This morning around 5 am she woke up and was having lower sternal squeezing pain up to 7/10. Pain associated with deep breathing. If she walks briskly, she breathes deeply and this causes some CP, therefore resting and not breathing deeply improves her pain. She went to the methadone clinic this morning and was told to come to the ED. Not associated with nausea, diaphoresis, radiation to left arm or neck.   Past Medical History  Diagnosis Date  . Hep C w/o coma, chronic   Chronic Pain on Methadone  Surgical history-none  Family history-no family history of MI/CAD/CVA in parents or siblings  History  Substance Use Topics  . Smoking status: Current Every Day Smoker-1/2 ppd  No alcohol or other drugs but history of other drugs positive)  Review of Systems  Respiratory: Positive for shortness of breath.   Cardiovascular: Positive for chest pain.   A full 10 point review of symptoms was performed and was negative except as noted in HPI.   Allergies  Review of patient's allergies indicates no known allergies.  Home Medications   Current Outpatient Rx  Name  Route  Sig  Dispense  Refill  . Aspirin-Salicylamide-Caffeine (BC HEADACHE POWDER PO)   Oral   Take 1 packet by mouth daily as needed (pain).         . METHADONE HCL PO   Oral   Take 98 mg by mouth daily.           BP 143/85  Pulse 62  Temp(Src) 98.1 F (36.7  C) (Oral)  Resp 18  SpO2 96%  Physical Exam  Constitutional: She is oriented to person, place, and time. She appears well-developed and well-nourished. No distress.  HENT:  Head: Normocephalic and atraumatic.  Eyes: Conjunctivae and EOM are normal. Pupils are equal, round, and reactive to light.  Neck: Normal range of motion. Neck supple.  Cardiovascular: Normal rate and regular rhythm.  Exam reveals no gallop and no friction rub.   No murmur heard. Pulmonary/Chest: Effort normal and breath sounds normal. She has no wheezes. She has no rales. She exhibits tenderness (very tender at left sternal border (reproduces pain completely per patient)).  Abdominal: Soft. Bowel sounds are normal. She exhibits no distension. There is no tenderness. There is no rebound.  Musculoskeletal: Normal range of motion. She exhibits no edema.  Neurological: She is alert and oriented to person, place, and time. No cranial nerve deficit. She exhibits normal muscle tone. Coordination normal.  Skin: Skin is warm and dry.   EKG-HR 75. NSR> inversions in avL and v1 unchanged from previous.   ED Course  Procedures (including critical care time)  Labs Reviewed  CBC - Abnormal; Notable for the following:    Hemoglobin 15.3 (*)    Platelets 134 (*)    All other components within normal limits  BASIC  METABOLIC PANEL - Abnormal; Notable for the following:    Glucose, Bld 141 (*)    BUN 5 (*)    All other components within normal limits  URINE RAPID DRUG SCREEN (HOSP PERFORMED)  POCT I-STAT TROPONIN I   Dg Chest 2 View  12/23/2012   *RADIOLOGY REPORT*  Clinical Data: Chest pain and shortness of breath  CHEST - 2 VIEW  Comparison: September 23, 2010  Findings:  Lungs clear.  Heart size and pulmonary vascularity are normal.  No adenopathy.  No bone lesions.  No pneumothorax.  IMPRESSION: No abnormality noted.   Original Report Authenticated By: Bretta Bang, M.D.   No diagnosis found.  MDM  52 year old female  smoker with no other risk factors for CAD presenting with chest pain.  Wells Criteria 0, normal EKG, POC troponin unremarkable, CXR unremarkable with reproducible chest pain on exam. Pain already starting to ease off without intervention. Believe this is low risk for CAD, PE, aneurysm. Will monitor until 3pm and repeat troponin at that time. If negative then will plan on discharge home at that time with ice to the area as well as PPI to treat 2 most likely etiologies: MSK and GERD.   Addendum 4:15 PM: Patient chest pain free. Awaiting negative troponin prior to discharge.   Addendum 4:30 PM: Troponin negative. Patient to be discharged and given resource guide for PCP follow up.   Shelva Majestic, MD 12/23/12 [phone]

## 2012-12-23 NOTE — ED Notes (Signed)
Went into patient room, patient not in room. EKG leads and gown left on bed.

## 2012-12-23 NOTE — ED Notes (Signed)
Patient stating "I am ready to leave and would like for the doctor to give me my discharge paper work." EDP made aware of patient request.

## 2012-12-23 NOTE — ED Notes (Signed)
Pt given sandwich per md.

## 2012-12-23 NOTE — ED Notes (Addendum)
Pt reports feeling SOB and feeling as if she cannot fill lungs.  Lung sounds clear.  Pt sleeping during assessment.  Pt reports squeezing pain in center of chest.  Pt smokes 1 pack per day.  Pain and sob started Friday.  Pt seen at [facility].  Pt answering questions appropriatly but falls asleep immediately afterward.   Pt alsop reports stepping on rusty nail last night and foot seems swollen. Last tetanus over 5 yrs.  Pt alert oriented X4

## 2012-12-23 NOTE — ED Notes (Signed)
EDP in room discussing plan of care with patient at this time.

## 2014-02-12 ENCOUNTER — Emergency Department (HOSPITAL_COMMUNITY)
Admission: EM | Admit: 2014-02-12 | Discharge: 2014-02-12 | Disposition: A | Discharge status: Home / Self Care | Payer: Self-pay | Attending: Emergency Medicine | Admitting: Emergency Medicine

## 2014-02-12 ENCOUNTER — Encounter (HOSPITAL_COMMUNITY): Payer: Self-pay | Admitting: Emergency Medicine

## 2014-02-12 DIAGNOSIS — Z8619 Personal history of other infectious and parasitic diseases: Secondary | ICD-10-CM | POA: Insufficient documentation

## 2014-02-12 DIAGNOSIS — Z79899 Other long term (current) drug therapy: Secondary | ICD-10-CM | POA: Insufficient documentation

## 2014-02-12 DIAGNOSIS — F17200 Nicotine dependence, unspecified, uncomplicated: Secondary | ICD-10-CM | POA: Insufficient documentation

## 2014-02-12 DIAGNOSIS — E119 Type 2 diabetes mellitus without complications: Secondary | ICD-10-CM

## 2014-02-12 DIAGNOSIS — R202 Paresthesia of skin: Secondary | ICD-10-CM

## 2014-02-12 DIAGNOSIS — R209 Unspecified disturbances of skin sensation: Secondary | ICD-10-CM | POA: Insufficient documentation

## 2014-02-12 LAB — CBC WITH DIFFERENTIAL/PLATELET
Basophils Absolute: 0 10*3/uL (ref 0.0–0.1)
Basophils Relative: 0 % (ref 0–1)
EOS ABS: 0.1 10*3/uL (ref 0.0–0.7)
EOS PCT: 2 % (ref 0–5)
HEMATOCRIT: 46.7 % — AB (ref 36.0–46.0)
HEMOGLOBIN: 15.6 g/dL — AB (ref 12.0–15.0)
LYMPHS ABS: 2.5 10*3/uL (ref 0.7–4.0)
LYMPHS PCT: 41 % (ref 12–46)
MCH: 33.2 pg (ref 26.0–34.0)
MCHC: 33.4 g/dL (ref 30.0–36.0)
MCV: 99.4 fL (ref 78.0–100.0)
MONO ABS: 0.4 10*3/uL (ref 0.1–1.0)
MONOS PCT: 7 % (ref 3–12)
Neutro Abs: 3 10*3/uL (ref 1.7–7.7)
Neutrophils Relative %: 50 % (ref 43–77)
PLATELETS: 60 10*3/uL — AB (ref 150–400)
RBC: 4.7 MIL/uL (ref 3.87–5.11)
RDW: 13.2 % (ref 11.5–15.5)
WBC: 6 10*3/uL (ref 4.0–10.5)

## 2014-02-12 LAB — COMPREHENSIVE METABOLIC PANEL
ALT: 49 U/L — ABNORMAL HIGH (ref 0–35)
ANION GAP: 11 (ref 5–15)
AST: 73 U/L — ABNORMAL HIGH (ref 0–37)
Albumin: 3 g/dL — ABNORMAL LOW (ref 3.5–5.2)
Alkaline Phosphatase: 161 U/L — ABNORMAL HIGH (ref 39–117)
BUN: 5 mg/dL — AB (ref 6–23)
CALCIUM: 8.9 mg/dL (ref 8.4–10.5)
CO2: 25 meq/L (ref 19–32)
CREATININE: 0.83 mg/dL (ref 0.50–1.10)
Chloride: 100 mEq/L (ref 96–112)
GFR, EST NON AFRICAN AMERICAN: 80 mL/min — AB (ref >90)
GLUCOSE: 207 mg/dL — AB (ref 70–99)
Potassium: 5 mEq/L (ref 3.7–5.3)
Sodium: 136 mEq/L — ABNORMAL LOW (ref 137–147)
TOTAL PROTEIN: 7.3 g/dL (ref 6.0–8.3)
Total Bilirubin: 1.2 mg/dL (ref 0.3–1.2)

## 2014-02-12 MED ORDER — METFORMIN HCL 1000 MG PO TABS: 500.0000 mg | ORAL_TABLET | Freq: Two times a day (BID) | ORAL | Status: DC

## 2014-02-12 NOTE — ED Notes (Signed)
Dr Ray at bedside. 

## 2014-02-12 NOTE — Discharge Instructions (Signed)
Hyperglycemia °Hyperglycemia occurs when the glucose (sugar) in your blood is too high. Hyperglycemia can happen for many reasons, but it most often happens to people who do not know they have diabetes or are not managing their diabetes properly.  °CAUSES  °Whether you have diabetes or not, there are other causes of hyperglycemia. Hyperglycemia can occur when you have diabetes, but it can also occur in other situations that you might not be as aware of, such as: °Diabetes °· If you have diabetes and are having problems controlling your blood glucose, hyperglycemia could occur because of some of the following reasons: °¨ Not following your meal plan. °¨ Not taking your diabetes medications or not taking it properly. °¨ Exercising less or doing less activity than you normally do. °¨ Being sick. °Pre-diabetes °· This cannot be ignored. Before people develop Type 2 diabetes, they almost always have "pre-diabetes." This is when your blood glucose levels are higher than normal, but not yet high enough to be diagnosed as diabetes. Research has shown that some long-term damage to the body, especially the heart and circulatory system, may already be occurring during pre-diabetes. If you take action to manage your blood glucose when you have pre-diabetes, you may delay or prevent Type 2 diabetes from developing. °Stress °· If you have diabetes, you may be "diet" controlled or on oral medications or insulin to control your diabetes. However, you may find that your blood glucose is higher than usual in the hospital whether you have diabetes or not. This is often referred to as "stress hyperglycemia." Stress can elevate your blood glucose. This happens because of hormones put out by the body during times of stress. If stress has been the cause of your high blood glucose, it can be followed regularly by your caregiver. That way he/she can make sure your hyperglycemia does not continue to get worse or progress to  diabetes. °Steroids °· Steroids are medications that act on the infection fighting system (immune system) to block inflammation or infection. One side effect can be a rise in blood glucose. Most people can produce enough extra insulin to allow for this rise, but for those who cannot, steroids make blood glucose levels go even higher. It is not unusual for steroid treatments to "uncover" diabetes that is developing. It is not always possible to determine if the hyperglycemia will go away after the steroids are stopped. A special blood test called an A1c is sometimes done to determine if your blood glucose was elevated before the steroids were started. °SYMPTOMS °· Thirsty. °· Frequent urination. °· Dry mouth. °· Blurred vision. °· Tired or fatigue. °· Weakness. °· Sleepy. °· Tingling in feet or leg. °DIAGNOSIS  °Diagnosis is made by monitoring blood glucose in one or all of the following ways: °· A1c test. This is a chemical found in your blood. °· Fingerstick blood glucose monitoring. °· Laboratory results. °TREATMENT  °First, knowing the cause of the hyperglycemia is important before the hyperglycemia can be treated. Treatment may include, but is not be limited to: °· Education. °· Change or adjustment in medications. °· Change or adjustment in meal plan. °· Treatment for an illness, infection, etc. °· More frequent blood glucose monitoring. °· Change in exercise plan. °· Decreasing or stopping steroids. °· Lifestyle changes. °HOME CARE INSTRUCTIONS  °· Test your blood glucose as directed. °· Exercise regularly. Your caregiver will give you instructions about exercise. Pre-diabetes or diabetes which comes on with stress is helped by exercising. °· Eat wholesome,   balanced meals. Eat often and at regular, fixed times. Your caregiver or nutritionist will give you a meal plan to guide your sugar intake.  Being at an ideal weight is important. If needed, losing as little as 10 to 15 pounds may help improve blood  glucose levels. SEEK MEDICAL CARE IF:   You have questions about medicine, activity, or diet.  You continue to have symptoms (problems such as increased thirst, urination, or weight gain). SEEK IMMEDIATE MEDICAL CARE IF:   You are vomiting or have diarrhea.  Your breath smells fruity.  You are breathing faster or slower.  You are very sleepy or incoherent.  You have numbness, tingling, or pain in your feet or hands.  You have chest pain.  Your symptoms get worse even though you have been following your caregiver's orders.  If you have any other questions or concerns. Document Released: 12/26/2000 Document Revised: 09/24/2011 Document Reviewed: 10/29/2011 [facility] Patient Information 2015 [place], [place]. This information is not intended to replace advice given to you by your health care provider. Make sure you discuss any questions you have with your health care provider. Paresthesia Paresthesia is a burning or prickling feeling. This feeling can happen in any part of the body. It often happens in the hands, arms, legs, or feet. HOME CARE  Avoid drinking alcohol.  Try massage or needle therapy (acupuncture) to help with your problems.  Keep all doctor visits as told. GET HELP RIGHT AWAY IF:   You feel weak.  You have trouble walking or moving.  You have problems speaking or seeing.  You feel confused.  You cannot control when you poop (bowel movement) or pee (urinate).  You lose feeling (numbness) after an injury.  You pass out (faint).  Your burning or prickling feeling gets worse when you walk.  You have pain, cramps, or feel dizzy.  You have a rash. MAKE SURE YOU:   Understand these instructions.  Will watch your condition.  Will get help right away if you are not doing well or get worse. Document Released: 06/14/2008 Document Revised: 09/24/2011 Document Reviewed: 03/23/2011 [facility] Patient Information 2015 [place]. This information is not  intended to replace advice given to you by your health care provider. Make sure you discuss any questions you have with your health care provider.

## 2014-02-12 NOTE — ED Provider Notes (Signed)
CSN: [id number]     Arrival date & time 02/12/14  1356 History   First MD Initiated Contact with Patient 02/12/14 1705     Chief Complaint  Patient presents with  . Numbness     (Consider location/radiation/quality/duration/timing/severity/associated sxs/prior Treatment) HPI This is a 53 year old female who comes in today complaining of bilateral foot paresthesias for 3 weeks. Paresthesias are worse sometimes in the morning and awaken her from sleep at night. They then tented improved during the day. She has not noted any color change of her legs. She has some discomfort with this but describes it more as tingling and pain. She has no prior history of similar symptoms. She states that her mothers house for nausea but she has not noted color change of her feet or toes. She is a smoker and continues to smoke. She voices concerns about diabetes stating that diabetes runs in her family but she herself has never previously been diagnosed. She has a history of hep C but is not currently on any medications. She states that she used to drink a significant amount but has cut back on that and only drinks occasionally now. She denies other illicit drug use. Past Medical History  Diagnosis Date  . Hep C w/o coma, chronic    History reviewed. No pertinent past surgical history. History reviewed. No pertinent family history. History  Substance Use Topics  . Smoking status: Current Every Day Smoker  . Smokeless tobacco: Not on file  . Alcohol Use: No   OB History   Grav Para Term Preterm Abortions TAB SAB Ect Mult Living                 Review of Systems  All other systems reviewed and are negative.     Allergies  Review of patient's allergies indicates no known allergies.  Home Medications   Prior to Admission medications   Medication Sig Start Date End Date Taking? Authorizing Provider  gabapentin (NEURONTIN) 300 MG capsule Take 300 mg by mouth 3 (three) times daily.   Yes Historical  Provider, MD  METHADONE HCL PO Take 1 tablet by mouth daily. Patient states its 91mg  tablet per patient   Yes Historical Provider, MD   BP 143/87  Pulse 65  Temp(Src) 98.1 F (36.7 C)  Resp 11  SpO2 100% Physical Exam  Nursing note and vitals reviewed. Constitutional: She is oriented to person, place, and time. She appears well-developed and well-nourished.  HENT:  Head: Normocephalic and atraumatic.  Right Ear: External ear normal.  Left Ear: External ear normal.  Nose: Nose normal.  Mouth/Throat: Oropharynx is clear and moist.  Eyes: Conjunctivae and EOM are normal. Pupils are equal, round, and reactive to light.  Neck: Normal range of motion. Neck supple.  Cardiovascular: Normal rate, regular rhythm, normal heart sounds and intact distal pulses.   Dorsal pedalis pulses are 2+ bilaterally posterior tibialis pulses are 2+ bilaterally Toes are pink capillary refill is less than 2 seconds.  Pulmonary/Chest: Effort normal and breath sounds normal.  Abdominal: Soft. Bowel sounds are normal.  Musculoskeletal: Normal range of motion. She exhibits no edema and no tenderness.  Neurological: She is alert and oriented to person, place, and time. She has normal reflexes. She displays normal reflexes. No cranial nerve deficit. She exhibits normal muscle tone. Coordination normal.  Strength is 5 out of 5 bilateral ankles knees and hips. No palmar drift is noted. Extraocular movements are intact. Cranial nerve II through XII are grossly intact.  Patient is ambulated without difficulty. No ataxia is noted.  Skin: Skin is warm and dry.  Psychiatric: She has a normal mood and affect.    ED Course  Procedures (including critical care time) Labs Review Labs Reviewed  CBC WITH DIFFERENTIAL - Abnormal; Notable for the following:    Hemoglobin 15.6 (*)    HCT 46.7 (*)    Platelets 60 (*)    All other components within normal limits  COMPREHENSIVE METABOLIC PANEL - Abnormal; Notable for the  following:    Sodium 136 (*)    Glucose, Bld 207 (*)    BUN 5 (*)    Albumin 3.0 (*)    AST 73 (*)    ALT 49 (*)    Alkaline Phosphatase 161 (*)    GFR calc non Af Amer 80 (*)    All other components within normal limits    Imaging Review No results found.   EKG Interpretation None      MDM   Final diagnoses:  Paresthesia  Diabetes mellitus, new onset   53 year old female stuck her feelings of paresthesias of her feet. She also shows new onset of diabetes. Patient could have peripheral neuropathy from her diabetes but also has history of alcohol abuse and need further assessment for B12 deficiency.  Patient with elevated glucose but needs further evaluation with fasting bs and advised of need for close follow up.  She voices understanding of return precautions and need for follow up     Hilario Quarry, MD 02/12/14 [phone]

## 2014-02-12 NOTE — ED Notes (Addendum)
Pt in c/o numbness to bilateral toes, states symptoms over the last three weeks, has been intermittent, denies numbness to other areas, denies pain, CMS intact

## 2014-02-12 NOTE — ED Notes (Signed)
Pt reports missing methadone dose this morning. Reports she can go two days without symptoms. Denies recent ETOH use, last drink 3 months ago.

## 2014-03-02 ENCOUNTER — Encounter: Payer: Self-pay | Admitting: Internal Medicine

## 2014-03-02 ENCOUNTER — Ambulatory Visit: Payer: Self-pay | Attending: Internal Medicine | Admitting: Internal Medicine

## 2014-03-02 VITALS — BP 135/92 | HR 88 | Temp 98.2°F | Resp 16 | Wt 123.4 lb

## 2014-03-02 DIAGNOSIS — Z8619 Personal history of other infectious and parasitic diseases: Secondary | ICD-10-CM | POA: Insufficient documentation

## 2014-03-02 DIAGNOSIS — R202 Paresthesia of skin: Secondary | ICD-10-CM | POA: Insufficient documentation

## 2014-03-02 DIAGNOSIS — E089 Diabetes mellitus due to underlying condition without complications: Secondary | ICD-10-CM | POA: Insufficient documentation

## 2014-03-02 DIAGNOSIS — R945 Abnormal results of liver function studies: Secondary | ICD-10-CM

## 2014-03-02 DIAGNOSIS — Z139 Encounter for screening, unspecified: Secondary | ICD-10-CM

## 2014-03-02 DIAGNOSIS — R7989 Other specified abnormal findings of blood chemistry: Secondary | ICD-10-CM | POA: Insufficient documentation

## 2014-03-02 DIAGNOSIS — Z1211 Encounter for screening for malignant neoplasm of colon: Secondary | ICD-10-CM

## 2014-03-02 DIAGNOSIS — Z79899 Other long term (current) drug therapy: Secondary | ICD-10-CM | POA: Insufficient documentation

## 2014-03-02 DIAGNOSIS — IMO0001 Reserved for inherently not codable concepts without codable children: Secondary | ICD-10-CM | POA: Insufficient documentation

## 2014-03-02 DIAGNOSIS — R209 Unspecified disturbances of skin sensation: Secondary | ICD-10-CM | POA: Insufficient documentation

## 2014-03-02 DIAGNOSIS — I10 Essential (primary) hypertension: Secondary | ICD-10-CM | POA: Insufficient documentation

## 2014-03-02 DIAGNOSIS — E139 Other specified diabetes mellitus without complications: Secondary | ICD-10-CM

## 2014-03-02 DIAGNOSIS — F17200 Nicotine dependence, unspecified, uncomplicated: Secondary | ICD-10-CM | POA: Insufficient documentation

## 2014-03-02 DIAGNOSIS — E119 Type 2 diabetes mellitus without complications: Secondary | ICD-10-CM | POA: Insufficient documentation

## 2014-03-02 DIAGNOSIS — D696 Thrombocytopenia, unspecified: Secondary | ICD-10-CM | POA: Insufficient documentation

## 2014-03-02 DIAGNOSIS — F1910 Other psychoactive substance abuse, uncomplicated: Secondary | ICD-10-CM | POA: Insufficient documentation

## 2014-03-02 DIAGNOSIS — Z0000 Encounter for general adult medical examination without abnormal findings: Secondary | ICD-10-CM

## 2014-03-02 DIAGNOSIS — R030 Elevated blood-pressure reading, without diagnosis of hypertension: Secondary | ICD-10-CM

## 2014-03-02 LAB — GLUCOSE, POCT (MANUAL RESULT ENTRY): POC Glucose: 217 mg/dl — AB (ref 70–99)

## 2014-03-02 LAB — POCT GLYCOSYLATED HEMOGLOBIN (HGB A1C): HEMOGLOBIN A1C: 6.5

## 2014-03-02 MED ORDER — METFORMIN HCL 500 MG PO TABS: 500.0000 mg | ORAL_TABLET | Freq: Every day | ORAL | Status: DC

## 2014-03-02 NOTE — Progress Notes (Signed)
Patient here to establish care Complains of both feeling numb and tingling Has gotten worse in the last couple of months

## 2014-03-02 NOTE — Progress Notes (Signed)
Patient Demographics  Martha Lozano, is a 53 y.o. female  ZOX:[id number]  [id number]  DOB - 10/30/1960  CC:  Chief Complaint  Patient presents with  . Establish Care    foot pain       HPI: Martha Lozano is a 53 y.o. female here today to establish medical care. Patient has history of polysubstance abuse, IV drug abuse, currently patient is on methadone, recently went to the emergency room with the symptoms of paresthesias in both  feet, denies any weakness fever chills, she was prescribed Neurontin as per patient it makes him more drowsy, she also has history of alcohol abuse and is to smoke cigarettes, as per patient she was diagnosed with hepatitis C almost 12-13 years ago, never had been treated for that. She had a blood work done and her sugar was high, she does report some history of diabetes, today her hemoglobin A1c in office is 6.5%. Patient has No headache, No chest pain, No abdominal pain - No Nausea, No new weakness tingling or numbness, No Cough - SOB.  No Known Allergies Past Medical History  Diagnosis Date  . Hep C w/o coma, chronic    Current Outpatient Prescriptions on File Prior to Visit  Medication Sig Dispense Refill  . gabapentin (NEURONTIN) 300 MG capsule Take 300 mg by mouth 3 (three) times daily.      Marland Kitchen METHADONE HCL PO Take 1 tablet by mouth daily. Patient states its 91mg  tablet per patient       No current facility-administered medications on file prior to visit.   Family History  Problem Relation Age of Onset  . Diabetes Sister   . Hypertension Sister   . Diabetes Brother   . Cancer Maternal Grandmother    History   Social History  . Marital Status: Single    Spouse Name: N/A    Number of Children: N/A  . Years of Education: N/A   Occupational History  . Not on file.   Social History Main Topics  . Smoking status: Current Every Day Smoker -- 0.50 packs/day for 35 years  . Smokeless tobacco: Not on file  . Alcohol Use: No    . Drug Use: No     Comment: history substance abuse   . Sexual Activity: Not on file   Other Topics Concern  . Not on file   Social History Narrative  . No narrative on file    Review of Systems: Constitutional: Negative for fever, chills, diaphoresis, activity change, appetite change and fatigue. HENT: Negative for ear pain, nosebleeds, congestion, facial swelling, rhinorrhea, neck pain, neck stiffness and ear discharge.  Eyes: Negative for pain, discharge, redness, itching and visual disturbance. Respiratory: Negative for cough, choking, chest tightness, shortness of breath, wheezing and stridor.  Cardiovascular: Negative for chest pain, palpitations and leg swelling. Gastrointestinal: Negative for abdominal distention. Genitourinary: Negative for dysuria, urgency, frequency, hematuria, flank pain, decreased urine volume, difficulty urinating and dyspareunia.  Musculoskeletal: Negative for back pain, joint swelling, arthralgia and gait problem. Neurological: Negative for dizziness, tremors, seizures, syncope, facial asymmetry, speech difficulty, weakness, light-headedness, numbness and headaches.  Hematological: Negative for adenopathy. Does not bruise/bleed easily. Psychiatric/Behavioral: Negative for hallucinations, behavioral problems, confusion, dysphoric mood, decreased concentration and agitation.    Objective:   Filed Vitals:   03/02/14 1125  BP: 135/92  Pulse: 88  Temp: 98.2 F (36.8 C)  Resp: 16    Physical Exam: Constitutional: Patient appears well-developed and well-nourished. No distress. HENT: Normocephalic,  atraumatic, External right and left ear normal. Oropharynx is clear and moist.  Eyes: Conjunctivae and EOM are normal. PERRLA, no scleral icterus. Neck: Normal ROM. Neck supple. No JVD. No tracheal deviation. No thyromegaly. CVS: RRR, S1/S2 +, no murmurs, no gallops, no carotid bruit.  Pulmonary: Effort and breath sounds normal, no stridor, rhonchi,  wheezes, rales.  Abdominal: Soft. BS +, no distension, tenderness, rebound or guarding.  Musculoskeletal: Normal range of motion. No edema and no tenderness. Bilateral feet callus no ulcer, monofilament test normal. Neuro: Alert. Normal reflexes, muscle tone coordination. No cranial nerve deficit. Skin: Skin is warm and dry. No rash noted. Not diaphoretic. No erythema. No pallor. Psychiatric: Normal mood and affect. Behavior, judgment, thought content normal.  Lab Results  Component Value Date   WBC 6.0 02/12/2014   HGB 15.6* 02/12/2014   HCT 46.7* 02/12/2014   MCV 99.4 02/12/2014   PLT 60* 02/12/2014   Lab Results  Component Value Date   CREATININE 0.83 02/12/2014   BUN 5* 02/12/2014   NA 136* 02/12/2014   K 5.0 02/12/2014   CL 100 02/12/2014   CO2 25 02/12/2014    Lab Results  Component Value Date   HGBA1C 6.5 03/02/2014   Lipid Panel  No results found for this basename: chol,  trig,  hdl,  cholhdl,  vldl,  ldlcalc       Assessment and plan:   1. Elevated BP Advised patient for DASH diet  2. Preventative health care  - Glucose (CBG) - HgB A1c  3. history of Polysubstance abuse Currently patient is on methadone.  4. History of hepatitis C/5. Abnormal LFTs  - Ambulatory referral to Infectious Disease  6. Smoking Advised patient to quit smoking  7. Thrombocytopenia, unspecified Denies any bleeding, will repeat CBC. - CBC with Differential; Future  8. Paresthesia of both feet Have advised patient to take Neurontin each bedtime, will check vitamin B12 level also advise to avoid alcohol.  9. Diabetes mellitus due to underlying condition without complications Results for orders placed in visit on 03/02/14  GLUCOSE, POCT (MANUAL RESULT ENTRY)      Result Value Ref Range   POC Glucose 217 (*) 70 - 99 mg/dl  POCT GLYCOSYLATED HEMOGLOBIN (HGB A1C)      Result Value Ref Range   Hemoglobin A1C 6.5     Daily diagnosed diabetes, I have advised patient for diabetes meal  planning, will start her on metformin 500 mg daily, repeat A1c in 3 months. - COMPLETE METABOLIC PANEL WITH GFR; Future - metFORMIN (GLUCOPHAGE) 500 MG tablet; Take 1 tablet (500 mg total) by mouth daily with breakfast.  Dispense: 90 tablet; Refill: 1  10. Screening Ordered baseline blood work. - TSH; Future - Vit D  25 hydroxy (rtn osteoporosis monitoring); Future - Lipid panel; Future - Vitamin B12; Future - MM DIGITAL SCREENING BILATERAL; Future  11. Special screening for malignant neoplasms, colon  - Ambulatory referral to Gastroenterology        Health Maintenance -Colonoscopy: referred to GI  -Mammogram: ordered    Return in about 3 months (around 06/02/2014) for diabetes.     Doris CheadleADVANI, Aisia Correira, MD

## 2014-03-02 NOTE — Patient Instructions (Signed)
Diabetes Mellitus and Food It is important for you to manage your blood sugar (glucose) level. Your blood glucose level can be greatly affected by what you eat. Eating healthier foods in the appropriate amounts throughout the day at about the same time each day will help you control your blood glucose level. It can also help slow or prevent worsening of your diabetes mellitus. Healthy eating may even help you improve the level of your blood pressure and reach or maintain a healthy weight.  HOW CAN FOOD AFFECT ME? Carbohydrates Carbohydrates affect your blood glucose level more than any other type of food. Your dietitian will help you determine how many carbohydrates to eat at each meal and teach you how to count carbohydrates. Counting carbohydrates is important to keep your blood glucose at a healthy level, especially if you are using insulin or taking certain medicines for diabetes mellitus. Alcohol Alcohol can cause sudden decreases in blood glucose (hypoglycemia), especially if you use insulin or take certain medicines for diabetes mellitus. Hypoglycemia can be a life-threatening condition. Symptoms of hypoglycemia (sleepiness, dizziness, and disorientation) are similar to symptoms of having too much alcohol.  If your health care provider has given you approval to drink alcohol, do so in moderation and use the following guidelines:  Women should not have more than one drink per day, and men should not have more than two drinks per day. One drink is equal to:  12 oz of beer.  5 oz of wine.  1 oz of hard liquor.  Do not drink on an empty stomach.  Keep yourself hydrated. Have water, diet soda, or unsweetened iced tea.  Regular soda, juice, and other mixers might contain a lot of carbohydrates and should be counted. WHAT FOODS ARE NOT RECOMMENDED? As you make food choices, it is important to remember that all foods are not the same. Some foods have fewer nutrients per serving than other  foods, even though they might have the same number of calories or carbohydrates. It is difficult to get your body what it needs when you eat foods with fewer nutrients. Examples of foods that you should avoid that are high in calories and carbohydrates but low in nutrients include:  Trans fats (most processed foods list trans fats on the Nutrition Facts label).  Regular soda.  Juice.  Candy.  Sweets, such as cake, pie, doughnuts, and cookies.  Fried foods. WHAT FOODS CAN I EAT? Have nutrient-rich foods, which will nourish your body and keep you healthy. The food you should eat also will depend on several factors, including:  The calories you need.  The medicines you take.  Your weight.  Your blood glucose level.  Your blood pressure level.  Your cholesterol level. You also should eat a variety of foods, including:  Protein, such as meat, poultry, fish, tofu, nuts, and seeds (lean animal proteins are best).  Fruits.  Vegetables.  Dairy products, such as milk, cheese, and yogurt (low fat is best).  Breads, grains, pasta, cereal, rice, and beans.  Fats such as olive oil, trans fat-free margarine, canola oil, avocado, and olives. DOES EVERYONE WITH DIABETES MELLITUS HAVE THE SAME MEAL PLAN? Because every person with diabetes mellitus is different, there is not one meal plan that works for everyone. It is very important that you meet with a dietitian who will help you create a meal plan that is just right for you. Document Released: 03/29/2005 Document Revised: 07/07/2013 Document Reviewed: 05/29/2013 ExitCare Patient Information 2015 [employer]. This   information is not intended to replace advice given to you by your health care provider. Make sure you discuss any questions you have with your health care provider. DASH Eating Plan DASH stands for "Dietary Approaches to Stop Hypertension." The DASH eating plan is a healthy eating plan that has been shown to reduce high  blood pressure (hypertension). Additional health benefits may include reducing the risk of type 2 diabetes mellitus, heart disease, and stroke. The DASH eating plan may also help with weight loss. WHAT DO I NEED TO KNOW ABOUT THE DASH EATING PLAN? For the DASH eating plan, you will follow these general guidelines:  Choose foods with a percent daily value for sodium of less than 5% (as listed on the food label).  Use salt-free seasonings or herbs instead of table salt or sea salt.  Check with your health care provider or pharmacist before using salt substitutes.  Eat lower-sodium products, often labeled as "lower sodium" or "no salt added."  Eat fresh foods.  Eat more vegetables, fruits, and low-fat dairy products.  Choose whole grains. Look for the word "whole" as the first word in the ingredient list.  Choose fish and skinless chicken or turkey more often than red meat. Limit fish, poultry, and meat to 6 oz (170 g) each day.  Limit sweets, desserts, sugars, and sugary drinks.  Choose heart-healthy fats.  Limit cheese to 1 oz (28 g) per day.  Eat more home-cooked food and less restaurant, buffet, and fast food.  Limit fried foods.  Cook foods using methods other than frying.  Limit canned vegetables. If you do use them, rinse them well to decrease the sodium.  When eating at a restaurant, ask that your food be prepared with less salt, or no salt if possible. WHAT FOODS CAN I EAT? Seek help from a dietitian for individual calorie needs. Grains Whole grain or whole wheat bread. Brown rice. Whole grain or whole wheat pasta. Quinoa, bulgur, and whole grain cereals. Low-sodium cereals. Corn or whole wheat flour tortillas. Whole grain cornbread. Whole grain crackers. Low-sodium crackers. Vegetables Fresh or frozen vegetables (raw, steamed, roasted, or grilled). Low-sodium or reduced-sodium tomato and vegetable juices. Low-sodium or reduced-sodium tomato sauce and paste. Low-sodium  or reduced-sodium canned vegetables.  Fruits All fresh, canned (in natural juice), or frozen fruits. Meat and Other Protein Products Ground beef (85% or leaner), grass-fed beef, or beef trimmed of fat. Skinless chicken or turkey. Ground chicken or turkey. Pork trimmed of fat. All fish and seafood. Eggs. Dried beans, peas, or lentils. Unsalted nuts and seeds. Unsalted canned beans. Dairy Low-fat dairy products, such as skim or 1% milk, 2% or reduced-fat cheeses, low-fat ricotta or cottage cheese, or plain low-fat yogurt. Low-sodium or reduced-sodium cheeses. Fats and Oils Tub margarines without trans fats. Light or reduced-fat mayonnaise and salad dressings (reduced sodium). Avocado. Safflower, olive, or canola oils. Natural peanut or almond butter. Other Unsalted popcorn and pretzels. The items listed above may not be a complete list of recommended foods or beverages. Contact your dietitian for more options. WHAT FOODS ARE NOT RECOMMENDED? Grains White bread. White pasta. White rice. Refined cornbread. Bagels and croissants. Crackers that contain trans fat. Vegetables Creamed or fried vegetables. Vegetables in a cheese sauce. Regular canned vegetables. Regular canned tomato sauce and paste. Regular tomato and vegetable juices. Fruits Dried fruits. Canned fruit in light or heavy syrup. Fruit juice. Meat and Other Protein Products Fatty cuts of meat. Ribs, chicken wings, bacon, sausage, bologna, salami, chitterlings, fatback, hot   dogs, bratwurst, and packaged luncheon meats. Salted nuts and seeds. Canned beans with salt. Dairy Whole or 2% milk, cream, half-and-half, and cream cheese. Whole-fat or sweetened yogurt. Full-fat cheeses or blue cheese. Nondairy creamers and whipped toppings. Processed cheese, cheese spreads, or cheese curds. Condiments Onion and garlic salt, seasoned salt, table salt, and sea salt. Canned and packaged gravies. Worcestershire sauce. Tartar sauce. Barbecue sauce.  Teriyaki sauce. Soy sauce, including reduced sodium. Steak sauce. Fish sauce. Oyster sauce. Cocktail sauce. Horseradish. Ketchup and mustard. Meat flavorings and tenderizers. Bouillon cubes. Hot sauce. Tabasco sauce. Marinades. Taco seasonings. Relishes. Fats and Oils Butter, stick margarine, lard, shortening, ghee, and bacon fat. Coconut, palm kernel, or palm oils. Regular salad dressings. Other Pickles and olives. Salted popcorn and pretzels. The items listed above may not be a complete list of foods and beverages to avoid. Contact your dietitian for more information. WHERE CAN I FIND MORE INFORMATION? National Heart, Lung, and [employer] Institute: www.nhlbi.nih.gov/health/health-topics/topics/dash/ Document Released: 06/21/2011 Document Revised: 11/16/2013 Document Reviewed: 05/06/2013 ExitCare Patient Information 2015 [employer]. This information is not intended to replace advice given to you by your health care provider. Make sure you discuss any questions you have with your health care provider.  

## 2014-03-05 ENCOUNTER — Telehealth: Payer: Self-pay | Admitting: Internal Medicine

## 2014-03-05 ENCOUNTER — Telehealth: Payer: Self-pay

## 2014-03-05 NOTE — Telephone Encounter (Signed)
Returned patient phone call Patient not available  

## 2014-03-05 NOTE — Telephone Encounter (Signed)
Pt. Called to ask what pharmacy her medication was sent to on the day of her visit on 03/02/14. Pt. Phone got disconnected before [facility] could give her information.

## 2014-03-09 ENCOUNTER — Ambulatory Visit: Payer: Self-pay | Attending: Internal Medicine

## 2014-03-09 DIAGNOSIS — E089 Diabetes mellitus due to underlying condition without complications: Secondary | ICD-10-CM

## 2014-03-09 DIAGNOSIS — D696 Thrombocytopenia, unspecified: Secondary | ICD-10-CM

## 2014-03-09 DIAGNOSIS — Z139 Encounter for screening, unspecified: Secondary | ICD-10-CM

## 2014-03-10 ENCOUNTER — Telehealth: Payer: Self-pay

## 2014-03-10 LAB — COMPLETE METABOLIC PANEL WITH GFR
ALK PHOS: 137 U/L — AB (ref 39–117)
ALT: 44 U/L — ABNORMAL HIGH (ref 0–35)
AST: 64 U/L — AB (ref 0–37)
Albumin: 3.2 g/dL — ABNORMAL LOW (ref 3.5–5.2)
BUN: 8 mg/dL (ref 6–23)
CALCIUM: 8.7 mg/dL (ref 8.4–10.5)
CO2: 29 mEq/L (ref 19–32)
Chloride: 103 mEq/L (ref 96–112)
Creat: 0.71 mg/dL (ref 0.50–1.10)
GFR, Est African American: >89 mL/min
GFR, Est Non African American: >89 mL/min
GLUCOSE: 161 mg/dL — AB (ref 70–99)
Potassium: 4.1 mEq/L (ref 3.5–5.3)
SODIUM: 138 meq/L (ref 135–145)
TOTAL PROTEIN: 6.8 g/dL (ref 6.0–8.3)
Total Bilirubin: 1.9 mg/dL — ABNORMAL HIGH (ref 0.2–1.2)

## 2014-03-10 LAB — CBC WITH DIFFERENTIAL/PLATELET
Basophils Absolute: 0.1 10*3/uL (ref 0.0–0.1)
Basophils Relative: 1 % (ref 0–1)
Eosinophils Absolute: 0.1 10*3/uL (ref 0.0–0.7)
Eosinophils Relative: 2 % (ref 0–5)
HCT: 42.5 % (ref 36.0–46.0)
Hemoglobin: 14.8 g/dL (ref 12.0–15.0)
Lymphocytes Relative: 45 % (ref 12–46)
Lymphs Abs: 2.8 10*3/uL (ref 0.7–4.0)
MCH: 32.7 pg (ref 26.0–34.0)
MCHC: 34.8 g/dL (ref 30.0–36.0)
MCV: 94 fL (ref 78.0–100.0)
Monocytes Absolute: 0.6 10*3/uL (ref 0.1–1.0)
Monocytes Relative: 9 % (ref 3–12)
Neutro Abs: 2.7 10*3/uL (ref 1.7–7.7)
Neutrophils Relative %: 43 % (ref 43–77)
Platelets: 87 10*3/uL — ABNORMAL LOW (ref 150–400)
RBC: 4.52 MIL/uL (ref 3.87–5.11)
RDW: 13.5 % (ref 11.5–15.5)
WBC: 6.3 10*3/uL (ref 4.0–10.5)

## 2014-03-10 LAB — LIPID PANEL
CHOL/HDL RATIO: 2.4 ratio
Cholesterol: 84 mg/dL (ref 0–200)
HDL: 35 mg/dL — ABNORMAL LOW (ref >39)
LDL CALC: 34 mg/dL (ref 0–99)
TRIGLYCERIDES: 73 mg/dL (ref <150)
VLDL: 15 mg/dL (ref 0–40)

## 2014-03-10 LAB — VITAMIN B12: Vitamin B-12: 986 pg/mL — ABNORMAL HIGH (ref 211–911)

## 2014-03-10 LAB — VITAMIN D 25 HYDROXY (VIT D DEFICIENCY, FRACTURES): Vit D, 25-Hydroxy: 24 ng/mL — ABNORMAL LOW (ref 30–89)

## 2014-03-10 LAB — TSH: TSH: 5.87 u[IU]/mL — ABNORMAL HIGH (ref 0.350–4.500)

## 2014-03-10 MED ORDER — VITAMIN D (ERGOCALCIFEROL) 1.25 MG (50000 UNIT) PO CAPS: 50000.0000 [IU] | ORAL_CAPSULE | ORAL | Status: DC

## 2014-03-10 NOTE — Telephone Encounter (Signed)
Message copied by Lestine Mount on Wed Mar 10, 2014 11:37 AM ------      Message from: Doris Cheadle      Created: Wed Mar 10, 2014 10:11 AM       Blood work reviewed, noticed low vitamin D, call patient advise to start ergocalciferol 50,000 units once a week for the duration of  12 weeks.      Also let the patient know that her LFTs are improving, advise patient to avoid alcohol and Tylenol.      Noticed borderline abnormal TSH level, will repeat the level on the next visit. ------

## 2014-03-10 NOTE — Telephone Encounter (Signed)
Patient is aware of her lab results Medication sent to [facility] pharmacy

## 2014-03-16 ENCOUNTER — Other Ambulatory Visit: Payer: Self-pay

## 2015-11-15 ENCOUNTER — Other Ambulatory Visit (HOSPITAL_COMMUNITY): Payer: Self-pay | Admitting: Gastroenterology

## 2015-11-15 DIAGNOSIS — B1920 Unspecified viral hepatitis C without hepatic coma: Secondary | ICD-10-CM

## 2015-12-08 ENCOUNTER — Ambulatory Visit (HOSPITAL_COMMUNITY)
Admission: RE | Admit: 2015-12-08 | Discharge: 2015-12-08 | Disposition: A | Discharge status: Home / Self Care | Payer: Self-pay | Source: Ambulatory Visit | Attending: Gastroenterology | Admitting: Gastroenterology

## 2015-12-08 DIAGNOSIS — B1920 Unspecified viral hepatitis C without hepatic coma: Secondary | ICD-10-CM | POA: Insufficient documentation

## 2015-12-08 DIAGNOSIS — K824 Cholesterolosis of gallbladder: Secondary | ICD-10-CM | POA: Insufficient documentation

## 2016-06-22 ENCOUNTER — Emergency Department (HOSPITAL_COMMUNITY)
Admission: EM | Admit: 2016-06-22 | Discharge: 2016-06-22 | Disposition: A | Discharge status: Home / Self Care | Payer: Self-pay | Attending: Emergency Medicine | Admitting: Emergency Medicine

## 2016-06-22 ENCOUNTER — Emergency Department (HOSPITAL_COMMUNITY): Payer: Self-pay

## 2016-06-22 ENCOUNTER — Encounter (HOSPITAL_COMMUNITY): Payer: Self-pay | Admitting: Emergency Medicine

## 2016-06-22 DIAGNOSIS — W108XXA Fall (on) (from) other stairs and steps, initial encounter: Secondary | ICD-10-CM | POA: Insufficient documentation

## 2016-06-22 DIAGNOSIS — S39012A Strain of muscle, fascia and tendon of lower back, initial encounter: Secondary | ICD-10-CM | POA: Insufficient documentation

## 2016-06-22 DIAGNOSIS — Y929 Unspecified place or not applicable: Secondary | ICD-10-CM | POA: Insufficient documentation

## 2016-06-22 DIAGNOSIS — W19XXXA Unspecified fall, initial encounter: Secondary | ICD-10-CM

## 2016-06-22 DIAGNOSIS — F17210 Nicotine dependence, cigarettes, uncomplicated: Secondary | ICD-10-CM | POA: Insufficient documentation

## 2016-06-22 DIAGNOSIS — Y939 Activity, unspecified: Secondary | ICD-10-CM | POA: Insufficient documentation

## 2016-06-22 DIAGNOSIS — Y999 Unspecified external cause status: Secondary | ICD-10-CM | POA: Insufficient documentation

## 2016-06-22 DIAGNOSIS — S300XXA Contusion of lower back and pelvis, initial encounter: Secondary | ICD-10-CM

## 2016-06-22 DIAGNOSIS — E119 Type 2 diabetes mellitus without complications: Secondary | ICD-10-CM | POA: Insufficient documentation

## 2016-06-22 DIAGNOSIS — Z7984 Long term (current) use of oral hypoglycemic drugs: Secondary | ICD-10-CM | POA: Insufficient documentation

## 2016-06-22 HISTORY — DX: Type 2 diabetes mellitus without complications: E11.9

## 2016-06-22 MED ORDER — DICLOFENAC SODIUM 75 MG PO TBEC: 75.0000 mg | DELAYED_RELEASE_TABLET | Freq: Two times a day (BID) | ORAL | 0 refills | Status: DC

## 2016-06-22 MED ORDER — CYCLOBENZAPRINE HCL 10 MG PO TABS: 10.0000 mg | ORAL_TABLET | Freq: Three times a day (TID) | ORAL | 0 refills | Status: DC

## 2016-06-22 MED ORDER — DIAZEPAM 5 MG PO TABS
10.0000 mg | ORAL_TABLET | Freq: Once | ORAL | Status: AC
  Administered 2016-06-22: 10 mg via ORAL
  Filled 2016-06-22: qty 2

## 2016-06-22 MED ORDER — KETOROLAC TROMETHAMINE 10 MG PO TABS
10.0000 mg | ORAL_TABLET | Freq: Once | ORAL | Status: AC
  Administered 2016-06-22: 10 mg via ORAL
  Filled 2016-06-22: qty 1

## 2016-06-22 NOTE — ED Provider Notes (Addendum)
[facility]Y DEPT Provider Note   CSN: [id number] Arrival date & time: 06/22/16  1125     History   Chief Complaint Chief Complaint  Patient presents with  . Fall    HPI Martha Lozano is a 55 y.o. female.  Pt is a 55 y/o female who present to ED with "butt bone", and back pain.  Pt states she fell down about 3 steps last night. Today she has increasing pain of the back and buttocks area.   The history is provided by the patient.  Fall  This is a new problem. The current episode started yesterday. The problem occurs hourly. The problem has been gradually worsening. Pertinent negatives include no chest pain, no abdominal pain and no shortness of breath. Exacerbated by: sitting and standing. The symptoms are relieved by position. She has tried rest for the symptoms. The treatment provided no relief.    Past Medical History:  Diagnosis Date  . Diabetes mellitus without complication (HCC)   . Hep C w/o coma, chronic [facility](HCC)     Patient Active Problem List   Diagnosis Date Noted  . Polysubstance abuse 03/02/2014  . Elevated BP 03/02/2014  . History of hepatitis C 03/02/2014  . Abnormal LFTs 03/02/2014  . Smoking 03/02/2014  . Thrombocytopenia, unspecified 03/02/2014  . Paresthesia of both feet 03/02/2014  . Diabetes mellitus due to underlying condition without complications (HCC) 03/02/2014    History reviewed. No pertinent surgical history.  OB History    Gravida Para Term Preterm AB Living   1 1 1          SAB TAB Ectopic Multiple Live Births                   Home Medications    Prior to Admission medications   Medication Sig Start Date End Date Taking? Authorizing Provider  gabapentin (NEURONTIN) 300 MG capsule Take 300 mg by mouth 3 (three) times daily.    Historical Provider, MD  metFORMIN (GLUCOPHAGE) 500 MG tablet Take 1 tablet (500 mg total) by mouth daily with breakfast. 03/02/14   Doris Cheadleeepak Advani, MD  METHADONE HCL PO Take 1 tablet by mouth daily.  Patient states its 91mg  tablet per patient    Historical Provider, MD  Vitamin D, Ergocalciferol, (DRISDOL) 50000 UNITS CAPS capsule Take 1 capsule (50,000 Units total) by mouth every 7 (seven) days. 03/10/14   Doris Cheadleeepak Advani, MD    Family History Family History  Problem Relation Age of Onset  . Diabetes Sister   . Hypertension Sister   . Diabetes Brother   . Cancer Maternal Grandmother     Social History Social History  Substance Use Topics  . Smoking status: Current Every Day Smoker    Packs/day: 0.50    Years: 35.00    Types: Cigarettes  . Smokeless tobacco: Never Used  . Alcohol use No     Allergies   Patient has no known allergies.   Review of Systems Review of Systems  Constitutional: Negative for activity change.       All ROS Neg except as noted in HPI  HENT: Negative for nosebleeds.   Eyes: Negative for photophobia and discharge.  Respiratory: Negative for cough, shortness of breath and wheezing.   Cardiovascular: Negative for chest pain and palpitations.  Gastrointestinal: Negative for abdominal pain and blood in stool.  Genitourinary: Negative for dysuria, frequency and hematuria.  Musculoskeletal: Positive for back pain. Negative for arthralgias and neck pain.  Skin: Negative.   Neurological:  Negative for dizziness, seizures and speech difficulty.       Paresthesias  Psychiatric/Behavioral: Negative for confusion and hallucinations.     Physical Exam Updated Vital Signs BP 161/89 (BP Location: Left Arm)   Pulse 109   Temp 98 F (36.7 C) (Oral)   Resp 16   Ht 5' (1.524 m)   Wt 56.7 kg   LMP 02/21/2016   SpO2 98%   BMI 24.41 kg/m   Physical Exam  Constitutional: She is oriented to person, place, and time. She appears well-developed and well-nourished.  Non-toxic appearance.  HENT:  Head: Normocephalic.  Right Ear: Tympanic membrane and external ear normal.  Left Ear: Tympanic membrane and external ear normal.  Eyes: EOM and lids are normal.  Pupils are equal, round, and reactive to light.  Neck: Normal range of motion. Neck supple. Carotid bruit is not present.  Cardiovascular: Normal rate, regular rhythm, normal heart sounds, intact distal pulses and normal pulses.   Pulmonary/Chest: Breath sounds normal. No respiratory distress.  Abdominal: Soft. Bowel sounds are normal. There is no tenderness. There is no guarding.  Musculoskeletal: She exhibits tenderness.  This note palpable step off of the cervical, thoracic, or lumbar spine. There is paraspinal area tenderness in the lumbar area. There is tenderness in the coccyx region.  Lymphadenopathy:       Head (right side): No submandibular adenopathy present.       Head (left side): No submandibular adenopathy present.    She has no cervical adenopathy.  Neurological: She is alert and oriented to person, place, and time. She has normal strength. No cranial nerve deficit or sensory deficit.  Skin: Skin is warm and dry.  Psychiatric: She has a normal mood and affect. Her speech is normal.  Nursing note and vitals reviewed.    ED Treatments / Results  Labs (all labs ordered are listed, but only abnormal results are displayed) Labs Reviewed - No data to display  EKG  EKG Interpretation None       Radiology Dg Lumbar Spine Complete  Result Date: 06/22/2016 CLINICAL DATA:  Larey SeatFell yesterday.  Low back pain. EXAM: LUMBAR SPINE - COMPLETE 4+ VIEW COMPARISON:  CT 08/18/2010 FINDINGS: Alignment of the lumbar spine is normal. Mild degenerative endplate changes. Negative for a compression fracture. Aortic atherosclerotic calcifications. Disc spaces are maintained. No evidence for a pars defect. IMPRESSION: No acute abnormality. Aortic atherosclerosis. Electronically Signed   By: Richarda OverlieAdam  Henn M.D.   On: 06/22/2016 12:13   Dg Sacrum/coccyx  Result Date: 06/22/2016 CLINICAL DATA:  Low back pain after fall yesterday. EXAM: SACRUM AND COCCYX - 2+ VIEW COMPARISON:  Lumbar spine films,  dictated separately. CT of 08/18/2010 FINDINGS: Sacroiliac joints are symmetric. No sacral fracture identified. Normal appearance of the coccyx on the lateral view. IMPRESSION: No acute osseous abnormality. Electronically Signed   By: Jeronimo GreavesKyle  Talbot M.D.   On: 06/22/2016 12:13    Procedures Procedures (including critical care time)  Medications Ordered in ED Medications - No data to display   Initial Impression / Assessment and Plan / ED Course  I have reviewed the triage vital signs and the nursing notes.  Pertinent labs & imaging results that were available during my care of the patient were reviewed by me and considered in my medical decision making (see chart for details).  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  *I have reviewed nursing notes, vital signs, and all appropriate lab and imaging results for this patient.**  Final Clinical Impressions(s) / ED Diagnoses  Patient sustained a fall down to 3 steps on last evening. She sustained injury to her back and her coccyx area. She has pain with walking, and with certain movement and certain positions. X-ray of the coccyx is negative for fracture or dislocation. X-ray of the lumbar spine show some arthritis changes, but no fracture and no dislocation. Patient will be treated with Flexeril and diclofenac. I've asked her to use a heating pad. And she is to follow-up with her primary physician.    Final diagnoses:  None    New Prescriptions New Prescriptions   No medications on file     Ivery Quale, Cordelia Poche 06/22/16 1350    Azalia Bilis, MD 06/22/16 1412    Ivery Quale, PA-C 07/04/16 2117    Azalia Bilis, MD 07/05/16 1039

## 2016-06-22 NOTE — Discharge Instructions (Signed)
Your vital signs have been reviewed. Your x-ray is negative for fracture or dislocation. Your examination suggests muscle strain, and or contusion to your coccyx area. Please use a soft pillow, or doughnut type pillow until the symptoms have resolved. Please use diclofenac 2 times daily, use Flexeril 3 times daily for spasm pain. Please see your MD next

## 2016-06-22 NOTE — ED Triage Notes (Signed)
Pt states she fell going down the steps and injured her lower back and coccyx.

## 2016-06-22 NOTE — ED Notes (Signed)
Pt updated at this time, calm and cooperative.

## 2016-06-22 NOTE — ED Notes (Signed)
Pt reports falling down 3 stairs last night landing on coccyx. Now has lower back pain radiates down R buttock. Denies bowel/bladder incontinence. Pt ambulatory to room, denies new weakness in legs.

## 2016-12-17 ENCOUNTER — Other Ambulatory Visit: Payer: Self-pay | Admitting: *Deleted

## 2016-12-17 DIAGNOSIS — Z1231 Encounter for screening mammogram for malignant neoplasm of breast: Secondary | ICD-10-CM

## 2017-02-11 ENCOUNTER — Ambulatory Visit
Admission: RE | Admit: 2017-02-11 | Discharge: 2017-02-11 | Disposition: A | Discharge status: Home / Self Care | Payer: No Typology Code available for payment source | Source: Ambulatory Visit | Attending: *Deleted | Admitting: *Deleted

## 2017-02-11 DIAGNOSIS — Z1231 Encounter for screening mammogram for malignant neoplasm of breast: Secondary | ICD-10-CM

## 2017-02-13 ENCOUNTER — Other Ambulatory Visit: Payer: Self-pay | Admitting: *Deleted

## 2017-02-13 DIAGNOSIS — R928 Other abnormal and inconclusive findings on diagnostic imaging of breast: Secondary | ICD-10-CM

## 2017-02-19 ENCOUNTER — Other Ambulatory Visit: Payer: Self-pay | Admitting: Obstetrics and Gynecology

## 2017-02-19 DIAGNOSIS — R928 Other abnormal and inconclusive findings on diagnostic imaging of breast: Secondary | ICD-10-CM

## 2017-03-05 ENCOUNTER — Ambulatory Visit: Payer: No Typology Code available for payment source

## 2017-03-05 ENCOUNTER — Ambulatory Visit (HOSPITAL_COMMUNITY)
Admission: RE | Admit: 2017-03-05 | Discharge: 2017-03-05 | Disposition: A | Discharge status: Home / Self Care | Payer: Self-pay | Source: Ambulatory Visit | Attending: Obstetrics and Gynecology | Admitting: Obstetrics and Gynecology

## 2017-03-05 ENCOUNTER — Encounter (HOSPITAL_COMMUNITY): Payer: Self-pay

## 2017-03-05 ENCOUNTER — Ambulatory Visit
Admission: RE | Admit: 2017-03-05 | Discharge: 2017-03-05 | Disposition: A | Discharge status: Home / Self Care | Payer: No Typology Code available for payment source | Source: Ambulatory Visit | Attending: Obstetrics and Gynecology | Admitting: Obstetrics and Gynecology

## 2017-03-05 VITALS — BP 159/82 | HR 84 | Temp 98.8°F | Ht 60.0 in | Wt 113.0 lb

## 2017-03-05 DIAGNOSIS — R928 Other abnormal and inconclusive findings on diagnostic imaging of breast: Secondary | ICD-10-CM

## 2017-03-05 DIAGNOSIS — Z01419 Encounter for gynecological examination (general) (routine) without abnormal findings: Secondary | ICD-10-CM

## 2017-03-05 NOTE — Progress Notes (Signed)
Patient referred to [facility] by the [facility] due to recommending additional imaging of bilateral breasts. Screening mammogram completed 02/12/2017.  Pap Smear: Pap smear completed today. [place] and abnormal. Patient stated she had a colposcopy completed to follow up for abnormal Pap smear. No Pap smear results are in EPIC.  Physical exam: Breasts Breasts symmetrical. No skin abnormalities bilateral breasts. No nipple retraction bilateral breasts. No nipple discharge bilateral breasts. No lymphadenopathy. No lumps palpated bilateral breasts. No complaints of pain or tenderness on exam. Referred patient to the [facility] of [facility] for a diagnostic mammogram per recommendation. Appointment scheduled for Tuesday, March 05, 2017 at 1450.  Pelvic/Bimanual   Ext Genitalia No lesions, no swelling and no discharge observed on external genitalia.         Vagina Vagina pink and normal texture. No lesions or discharge observed in vagina.          Cervix Cervix is present. Cervix pink and of normal texture. No discharge observed.     Uterus Uterus is present and palpable. Uterus in normal position and normal size.        Adnexae Bilateral ovaries present and palpable. No tenderness on palpation.          Rectovaginal No rectal exam completed today since patient had no rectal complaints. No skin abnormalities observed on exam.    Smoking History: Patient is a current smoker. Discussed smoking cessation with patient. Referred to the [facility] Quitline and gave resources to free smoking cessation classes at [facility].  Patient Navigation: Patient education provided. Access to services provided for patient through [facility] program.   Colorectal Cancer Screening: Per patient has never had a colonoscopy completed. No complaints today. FIT Test given to patient to complete and return to [facility].

## 2017-03-05 NOTE — Patient Instructions (Signed)
Explained breast self awareness with Martha Lozano. Let patient know if today's Pap smear is normal that her next Pap smear is due in one year due to her recent history of an abnormal Pap smear. Referred patient to the [facility] for a diagnostic mammogram per recommendation. Appointment scheduled for Tuesday, March 05, 2017 at 1450. Let patient know will follow up with her within the next couple weeks with results of Pap smear by letter or phone. Discussed smoking cessation with patient. Referred to the [facility] Quitline and gave resources to free smoking cessation classes at [facility]. Martha Lozano verbalized understanding.  Martha Lozano, Kathaleen Maser, RN 2:07 PM

## 2017-03-06 ENCOUNTER — Other Ambulatory Visit: Payer: Self-pay | Admitting: Obstetrics and Gynecology

## 2017-03-06 ENCOUNTER — Encounter (HOSPITAL_COMMUNITY): Payer: Self-pay

## 2017-03-07 LAB — CYTOLOGY - PAP
DIAGNOSIS: NEGATIVE
HPV (WINDOPATH): NOT DETECTED

## 2017-03-18 LAB — FECAL OCCULT BLOOD, IMMUNOCHEMICAL: FECAL OCCULT BLD: NEGATIVE

## 2017-03-20 ENCOUNTER — Telehealth (HOSPITAL_COMMUNITY): Payer: Self-pay | Admitting: *Deleted

## 2017-03-20 NOTE — Telephone Encounter (Signed)
Attempted to call patient to discuss Pap smear results. No one answered phone. Left voicemail for patient to call me back. 

## 2017-03-22 ENCOUNTER — Telehealth (HOSPITAL_COMMUNITY): Payer: Self-pay | Admitting: *Deleted

## 2017-03-22 ENCOUNTER — Other Ambulatory Visit (HOSPITAL_COMMUNITY): Payer: Self-pay | Admitting: *Deleted

## 2017-03-22 NOTE — Telephone Encounter (Signed)
Patient returned my phone call. Explained to patient that her Pap smear was normal and HPV negative. Patient stated she had an abnormal Pap smear in 2016 that a colposcopy was completed and that was the last Pap smear she has had. Told patient that she will need a Pap smear in one year. Patient's Pap smear showed a shift in flora suggestive of bacterial vaginosis. Patient stated she has noticed vaginal discharge. Told patient will send a prescription to her pharmacy to treat. Explained Flagyl and to avoid alcohol while taking. Patient verbalized understanding.

## 2017-03-24 ENCOUNTER — Other Ambulatory Visit: Payer: Self-pay | Admitting: Obstetrics and Gynecology

## 2017-03-24 MED ORDER — METRONIDAZOLE 500 MG PO TABS: 500.0000 mg | ORAL_TABLET | Freq: Two times a day (BID) | ORAL | 0 refills | Status: DC

## 2017-04-17 ENCOUNTER — Other Ambulatory Visit (HOSPITAL_COMMUNITY): Payer: Self-pay | Admitting: *Deleted

## 2017-04-17 ENCOUNTER — Telehealth (HOSPITAL_COMMUNITY): Payer: Self-pay | Admitting: *Deleted

## 2017-04-17 DIAGNOSIS — N760 Acute vaginitis: Principal | ICD-10-CM

## 2017-04-17 DIAGNOSIS — B9689 Other specified bacterial agents as the cause of diseases classified elsewhere: Secondary | ICD-10-CM

## 2017-04-17 MED ORDER — METRONIDAZOLE 500 MG PO TABS: 500.0000 mg | ORAL_TABLET | Freq: Two times a day (BID) | ORAL | 0 refills | Status: DC

## 2017-04-17 NOTE — Telephone Encounter (Signed)
Patient called today stating she was unable to get her prescription for Flagyl. Patient stated she hasn't been able to come to [facility] to pick it up and now has Medicaid. Patient asked if we can send prescription to [facility]. Will send prescription to [facility] and told patient to avoid alcohol while taking medication. Patient verbalized understanding.

## 2017-07-26 ENCOUNTER — Other Ambulatory Visit: Payer: Self-pay | Admitting: Internal Medicine

## 2017-07-26 DIAGNOSIS — R921 Mammographic calcification found on diagnostic imaging of breast: Secondary | ICD-10-CM

## 2017-08-09 ENCOUNTER — Telehealth (HOSPITAL_COMMUNITY): Payer: Self-pay

## 2017-08-09 NOTE — Telephone Encounter (Signed)
Phoned patient and left message to remind patient to schedule her six month follow up mammogram at the breast exam.

## 2017-09-02 ENCOUNTER — Telehealth (HOSPITAL_COMMUNITY): Payer: Self-pay

## 2017-09-02 NOTE — Telephone Encounter (Signed)
Phoned patient regarding need to go to her follow up appointment for her mammogram. Left call back number.

## 2017-09-06 ENCOUNTER — Other Ambulatory Visit: Payer: Self-pay | Admitting: Internal Medicine

## 2017-09-06 ENCOUNTER — Ambulatory Visit
Admission: RE | Admit: 2017-09-06 | Discharge: 2017-09-06 | Disposition: A | Discharge status: Home / Self Care | Payer: Medicaid Other | Source: Ambulatory Visit | Attending: Internal Medicine | Admitting: Internal Medicine

## 2017-09-06 DIAGNOSIS — R921 Mammographic calcification found on diagnostic imaging of breast: Secondary | ICD-10-CM

## 2017-09-11 ENCOUNTER — Ambulatory Visit
Admission: RE | Admit: 2017-09-11 | Discharge: 2017-09-11 | Disposition: A | Discharge status: Home / Self Care | Payer: Medicaid Other | Source: Ambulatory Visit | Attending: Internal Medicine | Admitting: Internal Medicine

## 2017-09-11 ENCOUNTER — Other Ambulatory Visit: Payer: Self-pay | Admitting: Internal Medicine

## 2017-09-11 DIAGNOSIS — R921 Mammographic calcification found on diagnostic imaging of breast: Secondary | ICD-10-CM

## 2017-09-12 HISTORY — PX: BREAST BIOPSY: SHX20

## 2017-09-15 ENCOUNTER — Encounter: Payer: Self-pay | Admitting: General Surgery

## 2017-09-23 ENCOUNTER — Telehealth: Payer: Self-pay | Admitting: Hematology and Oncology

## 2017-09-23 ENCOUNTER — Encounter: Payer: Self-pay | Admitting: Hematology and Oncology

## 2017-09-23 ENCOUNTER — Encounter: Payer: Self-pay | Admitting: Genetics

## 2017-09-23 NOTE — Telephone Encounter (Signed)
A med onc appt has been scheduled for the pt to see Dr. Pamelia HoitGudena on 3/21 at 1pm. Pt aware to arrive 30 minutes early. Genetics appt has been scheduled for the pt to see Lillia AbedLindsay on 4/2 at 10am. Letters mailed.

## 2017-10-03 ENCOUNTER — Encounter: Payer: Self-pay | Admitting: Medical Oncology

## 2017-10-03 ENCOUNTER — Encounter: Payer: Self-pay | Admitting: Hematology and Oncology

## 2017-10-03 ENCOUNTER — Encounter: Payer: Self-pay | Admitting: *Deleted

## 2017-10-03 ENCOUNTER — Inpatient Hospital Stay: Payer: Medicaid Other | Attending: Hematology and Oncology | Admitting: Hematology and Oncology

## 2017-10-03 DIAGNOSIS — Z720 Tobacco use: Secondary | ICD-10-CM | POA: Diagnosis not present

## 2017-10-03 DIAGNOSIS — E119 Type 2 diabetes mellitus without complications: Secondary | ICD-10-CM | POA: Insufficient documentation

## 2017-10-03 DIAGNOSIS — Z803 Family history of malignant neoplasm of breast: Secondary | ICD-10-CM

## 2017-10-03 DIAGNOSIS — D0511 Intraductal carcinoma in situ of right breast: Secondary | ICD-10-CM | POA: Insufficient documentation

## 2017-10-03 NOTE — Progress Notes (Signed)
Cancer Center CONSULT NOTE  Patient Care Team: Placey, Chales Abrahams, NP as PCP - General  CHIEF COMPLAINTS/PURPOSE OF CONSULTATION:  Newly diagnosed breast cancer  HISTORY OF PRESENTING ILLNESS:  Martha Lozano 57 y.o. female is here because of recent diagnosis of right breast DCIS.  Patient had a routine screening mammogram that detected pleomorphic and branching linear calcifications in the right breast.  Stereotactic biopsy was performed that revealed intermediate grade DCIS.  She was seen by surgery and she is here today to discuss the treatment plan. Her sister is a patient of mine with breast cancer.  I reviewed her records extensively and collaborated the history with the patient.  SUMMARY OF ONCOLOGIC HISTORY:   Ductal carcinoma in situ (DCIS) of right breast   09/11/2017 Initial Diagnosis    Pleomorphic and branching linear calcifications right breast lateral aspect, stereotactic biopsy of right UOQ and right posterior UOQ revealed intermediate grade DCIS ER 100%, PR 90%, Tis NX stage 0       MEDICAL HISTORY:  Past Medical History:  Diagnosis Date  . Diabetes mellitus without complication (HCC)   . Hep C w/o coma, chronic (HCC)     SURGICAL HISTORY: No past surgical history on file.  SOCIAL HISTORY: Social History   Socioeconomic History  . Marital status: Widowed    Spouse name: Not on file  . Number of children: Not on file  . Years of education: Not on file  . Highest education level: Not on file  Occupational History  . Not on file  Social Needs  . Financial resource strain: Not on file  . Food insecurity:    Worry: Not on file    Inability: Not on file  . Transportation needs:    Medical: Not on file    Non-medical: Not on file  Tobacco Use  . Smoking status: Current Every Day Smoker    Packs/day: 0.50    Years: 35.00    Pack years: 17.50    Types: Cigarettes  . Smokeless tobacco: Never Used  Substance and Sexual Activity  . Alcohol  use: Yes    Comment: Occasional  . Drug use: No    Comment: history substance abuse   . Sexual activity: Yes    Birth control/protection: None  Lifestyle  . Physical activity:    Days per week: Not on file    Minutes per session: Not on file  . Stress: Not on file  Relationships  . Social connections:    Talks on phone: Not on file    Gets together: Not on file    Attends religious service: Not on file    Active member of club or organization: Not on file    Attends meetings of clubs or organizations: Not on file    Relationship status: Not on file  . Intimate partner violence:    Fear of current or ex partner: Not on file    Emotionally abused: Not on file    Physically abused: Not on file    Forced sexual activity: Not on file  Other Topics Concern  . Not on file  Social History Narrative  . Not on file    FAMILY HISTORY: Family History  Problem Relation Age of Onset  . Diabetes Sister   . Hypertension Sister   . Breast cancer Sister   . Diabetes Brother   . Cancer Maternal Grandmother     ALLERGIES:  has No Known Allergies.  MEDICATIONS:  Current Outpatient Medications  Medication Sig Dispense Refill  . cyclobenzaprine (FLEXERIL) 10 MG tablet Take 1 tablet (10 mg total) by mouth 3 (three) times daily. 20 tablet 0  . diclofenac (VOLTAREN) 75 MG EC tablet Take 1 tablet (75 mg total) by mouth 2 (two) times daily. 14 tablet 0  . gabapentin (NEURONTIN) 300 MG capsule Take 300 mg by mouth 3 (three) times daily.    . metFORMIN (GLUCOPHAGE) 500 MG tablet Take 1 tablet (500 mg total) by mouth daily with breakfast. 90 tablet 1  . METHADONE HCL PO Take 1 tablet by mouth daily. Patient states its 91mg  tablet per patient    . metroNIDAZOLE (FLAGYL) 500 MG tablet Take 1 tablet (500 mg total) by mouth 2 (two) times daily. 14 tablet 0  . metroNIDAZOLE (FLAGYL) 500 MG tablet Take 1 tablet (500 mg total) by mouth 2 (two) times daily. 14 tablet 0  . metroNIDAZOLE (FLAGYL) 500 MG  tablet Take 1 tablet (500 mg total) by mouth 2 (two) times daily. 14 tablet 0  . Vitamin D, Ergocalciferol, (DRISDOL) 50000 UNITS CAPS capsule Take 1 capsule (50,000 Units total) by mouth every 7 (seven) days. 12 capsule 0   No current facility-administered medications for this visit.     REVIEW OF SYSTEMS:   Constitutional: Denies fevers, chills or abnormal night sweats Eyes: Denies blurriness of vision, double vision or watery eyes Ears, nose, mouth, throat, and face: Denies mucositis or sore throat Respiratory: Denies cough, dyspnea or wheezes Cardiovascular: Denies palpitation, chest discomfort or lower extremity swelling Gastrointestinal:  Denies nausea, heartburn or change in bowel habits Skin: Denies abnormal skin rashes Lymphatics: Denies new lymphadenopathy or easy bruising Neurological:Denies numbness, tingling or new weaknesses Behavioral/Psych: Mood is stable, no new changes  Breast:  Denies any palpable lumps or discharge All other systems were reviewed with the patient and are negative.  PHYSICAL EXAMINATION: ECOG PERFORMANCE STATUS: 1 - Symptomatic but completely ambulatory  Vitals:   10/03/17 1307  BP: (!) 140/93  Pulse: 74  Resp: 18  Temp: 98.2 F (36.8 C)  SpO2: 100%   Filed Weights   10/03/17 1307  Weight: 119 lb 6.4 oz (54.2 kg)    GENERAL:alert, no distress and comfortable SKIN: skin color, texture, turgor are normal, no rashes or significant lesions EYES: normal, conjunctiva are pink and non-injected, sclera clear OROPHARYNX:no exudate, no erythema and lips, buccal mucosa, and tongue normal  NECK: supple, thyroid normal size, non-tender, without nodularity LYMPH:  no palpable lymphadenopathy in the cervical, axillary or inguinal LUNGS: clear to auscultation and percussion with normal breathing effort HEART: regular rate & rhythm and no murmurs and no lower extremity edema ABDOMEN:abdomen soft, non-tender and normal bowel sounds Musculoskeletal:no  cyanosis of digits and no clubbing  PSYCH: alert & oriented x 3 with fluent speech NEURO: no focal motor/sensory deficits BREAST: No palpable nodules in breast. No palpable axillary or supraclavicular lymphadenopathy (exam performed in the presence of a chaperone)   LABORATORY DATA:  I have reviewed the data as listed Lab Results  Component Value Date   WBC 6.3 03/09/2014   HGB 14.8 03/09/2014   HCT 42.5 03/09/2014   MCV 94.0 03/09/2014   PLT 87 (L) 03/09/2014   Lab Results  Component Value Date   NA 138 03/09/2014   K 4.1 03/09/2014   CL 103 03/09/2014   CO2 29 03/09/2014    RADIOGRAPHIC STUDIES: I have personally reviewed the radiological reports and agreed with the findings in the report.  ASSESSMENT AND PLAN:  Ductal carcinoma in situ (DCIS) of right breast 09/11/2017:Pleomorphic and branching linear calcifications right breast lateral aspect, stereotactic biopsy of right UOQ and right posterior UOQ revealed intermediate grade DCIS ER 100%, PR 90%, Tis NX stage 0  Pathology review: I discussed with the patient the difference between DCIS and invasive breast cancer. It is considered a precancerous lesion. DCIS is classified as a 0. It is generally detected through mammograms as calcifications. We discussed the significance of grades and its impact on prognosis. We also discussed the importance of ER and PR receptors and their implications to adjuvant treatment options. Prognosis of DCIS dependence on grade, comedo necrosis. It is anticipated that if not treated, 20-30% of DCIS can develop into invasive breast cancer.  Standard of care: 1. Breast conserving surgery 2. Followed by adjuvant radiation therapy 3. Followed by antiestrogen therapy with tamoxifen 5 years  I discussed with her that an alternate treatment plan could be to participate in the clinical trial comet AFT 25 COMET Phase 3 clinical trial for low risk DCIS grade 1/2 PR positive, age greater than 40 randomized  to surgery +/- radiation, +/- endocrine therapy versus active surveillance with +/- endocrine therapy surveillance with mammograms every 6 months for 5 years;patient's have option to decline elevated arm and still be followed on study    Tamoxifen counseling: We discussed the risks and benefits of tamoxifen. These include but not limited to insomnia, hot flashes, mood changes, vaginal dryness, and weight gain. Although rare, serious side effects including endometrial cancer, risk of blood clots were also discussed. We strongly believe that the benefits far outweigh the risks. Patient understands these risks and consented to starting treatment. Planned treatment duration is 5 years.  Patient will be provided with clinical trial information consent form. If she decides to participate in the trial then she will be randomized to either active surveillance or surgery. If she goes on active surveillance then we will initiate antiestrogen therapy with tamoxifen.  Return to clinic based upon clinical trial participation. All questions were answered. The patient knows to call the clinic with any problems, questions or concerns.    Tamsen MeekViinay K Fonda Rochon, MD 10/03/17

## 2017-10-03 NOTE — Assessment & Plan Note (Signed)
09/11/2017:Pleomorphic and branching linear calcifications right breast lateral aspect, stereotactic biopsy of right UOQ and right posterior UOQ revealed intermediate grade DCIS ER 100%, PR 90%, Tis NX stage 0  Pathology review: I discussed with the patient the difference between DCIS and invasive breast cancer. It is considered a precancerous lesion. DCIS is classified as a 0. It is generally detected through mammograms as calcifications. We discussed the significance of grades and its impact on prognosis. We also discussed the importance of ER and PR receptors and their implications to adjuvant treatment options. Prognosis of DCIS dependence on grade, comedo necrosis. It is anticipated that if not treated, 20-30% of DCIS can develop into invasive breast cancer.  Recommendation: 1. Breast conserving surgery 2. Followed by adjuvant radiation therapy 3. Followed by antiestrogen therapy with tamoxifen 5 years  Tamoxifen counseling: We discussed the risks and benefits of tamoxifen. These include but not limited to insomnia, hot flashes, mood changes, vaginal dryness, and weight gain. Although rare, serious side effects including endometrial cancer, risk of blood clots were also discussed. We strongly believe that the benefits far outweigh the risks. Patient understands these risks and consented to starting treatment. Planned treatment duration is 5 years.  Return to clinic after surgery to discuss the final pathology report and come up with an adjuvant treatment plan.

## 2017-10-07 ENCOUNTER — Other Ambulatory Visit: Payer: Self-pay | Admitting: Hematology and Oncology

## 2017-10-07 DIAGNOSIS — D0511 Intraductal carcinoma in situ of right breast: Secondary | ICD-10-CM

## 2017-10-08 ENCOUNTER — Other Ambulatory Visit: Payer: Self-pay | Admitting: Hematology and Oncology

## 2017-10-08 DIAGNOSIS — D0511 Intraductal carcinoma in situ of right breast: Secondary | ICD-10-CM

## 2017-10-14 ENCOUNTER — Telehealth: Payer: Self-pay | Admitting: Medical Oncology

## 2017-10-14 ENCOUNTER — Encounter: Payer: Self-pay | Admitting: Genetics

## 2017-10-14 NOTE — Telephone Encounter (Signed)
Patient called to inform me that Friday she was made aware of an appointment schedule for her tomorrow at 10 with genetics counselor. Patient states that she was unaware of this appointment until Friday, and is unable to make the appointment. Patient states she relies on her sister for transportation and her sister is out of town for the week. Patient was informed that I will get her message to genetics counseling and they will follow up with her regarding rescheduling. Patient thanked me. Patient encouraged to call with further questions.  Darral DashLindsay Smith, genetics counselor informed.  Denny LevyLeonetti, Victorious Kundinger, RN, BSN Clinical Research 10/14/2017 9:38 AM

## 2017-10-15 ENCOUNTER — Other Ambulatory Visit: Payer: Medicaid Other

## 2017-10-15 ENCOUNTER — Encounter: Payer: Medicaid Other | Admitting: Genetics

## 2017-10-16 ENCOUNTER — Encounter: Payer: Self-pay | Admitting: *Deleted

## 2017-10-23 ENCOUNTER — Inpatient Hospital Stay: Payer: Medicaid Other | Attending: Hematology and Oncology | Admitting: Genetics

## 2017-10-23 ENCOUNTER — Inpatient Hospital Stay: Payer: Medicaid Other

## 2017-10-23 ENCOUNTER — Encounter: Payer: Self-pay | Admitting: Genetics

## 2017-10-23 ENCOUNTER — Ambulatory Visit
Admission: RE | Admit: 2017-10-23 | Discharge: 2017-10-23 | Disposition: A | Discharge status: Home / Self Care | Payer: Medicaid Other | Source: Ambulatory Visit | Attending: Hematology and Oncology | Admitting: Hematology and Oncology

## 2017-10-23 DIAGNOSIS — Z8042 Family history of malignant neoplasm of prostate: Secondary | ICD-10-CM | POA: Diagnosis not present

## 2017-10-23 DIAGNOSIS — Z315 Encounter for genetic counseling: Secondary | ICD-10-CM | POA: Diagnosis not present

## 2017-10-23 DIAGNOSIS — Z800 Family history of malignant neoplasm of digestive organs: Secondary | ICD-10-CM

## 2017-10-23 DIAGNOSIS — Z809 Family history of malignant neoplasm, unspecified: Secondary | ICD-10-CM | POA: Diagnosis not present

## 2017-10-23 DIAGNOSIS — D0511 Intraductal carcinoma in situ of right breast: Secondary | ICD-10-CM

## 2017-10-23 DIAGNOSIS — Z808 Family history of malignant neoplasm of other organs or systems: Secondary | ICD-10-CM | POA: Diagnosis not present

## 2017-10-23 DIAGNOSIS — Z803 Family history of malignant neoplasm of breast: Secondary | ICD-10-CM | POA: Diagnosis not present

## 2017-10-23 NOTE — Progress Notes (Signed)
REFERRING PROVIDER: Nicholas Lose, MD [place], [place] [id number]  PRIMARY PROVIDER:  Marliss Coots, NP  PRIMARY REASON FOR VISIT:  1. Ductal carcinoma in situ (DCIS) of right breast   2. Family history of prostate cancer   3. Family history of breast cancer   4. Family history of skin cancer      HISTORY OF PRESENT ILLNESS:   [name], a 57 y.o. female, was seen for a [place] cancer genetics consultation at the request of Dr. Lindi Lozano due to a personal and family history of cancer.  Martha Lozano presents to clinic today to discuss the possibility of a hereditary predisposition to cancer, genetic testing, and to further clarify her future cancer risks, as well as potential cancer risks for family members.   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 She reports she has discussed the options COMET trial vs. Breast conservation surgery + radiation with her doctors.  Antiestrogen therapy.    CANCER HISTORY:    Ductal carcinoma in situ (DCIS) of right breast   09/11/2017 Initial Diagnosis    Pleomorphic and branching linear calcifications right breast lateral aspect, stereotactic biopsy of right UOQ and right posterior UOQ revealed intermediate grade DCIS ER 100%, PR 90%, Tis NX stage 0       HORMONAL RISK FACTORS:  First live birth at age 61.  Ovaries intact: yes.  Hysterectomy: no.  Colonoscopy: no; not examined. Mammogram within the last year: yes.   Past Medical History:  Diagnosis Date  . Diabetes mellitus without complication ([place])   . Family history of breast cancer   . Family history of prostate cancer   . Family history of skin cancer   . Hep C w/o coma, chronic ([facility])     No past surgical history on file.  Social History   Socioeconomic History  . Marital status: Widowed    Spouse name: Not on file  . Number of children: Not on file  . Years of  education: Not on file  . Highest education level: Not on file  Occupational History  . Not on file  Social Needs  . Financial resource strain: Not on file  . Food insecurity:    Worry: Not on file    Inability: Not on file  . Transportation needs:    Medical: Not on file    Non-medical: Not on file  Tobacco Use  . Smoking status: Current Every Day Smoker    Packs/day: 0.50    Years: 35.00    Pack years: 17.50    Types: Cigarettes  . Smokeless tobacco: Never Used  Substance and Sexual Activity  . Alcohol use: Yes    Comment: Occasional  . Drug use: No    Comment: history substance abuse   . Sexual activity: Yes    Birth control/protection: None  Lifestyle  . Physical activity:    Days per week: Not on file    Minutes per session: Not on file  . Stress: Not on file  Relationships  . Social connections:    Talks on phone: Not on file    Gets together: Not on file    Attends religious service: Not on file    Active member of club or organization: Not on file    Attends meetings of clubs or organizations: Not on file    Relationship status: Not on file  Other Topics Concern  . Not on file  Social History Narrative  . Not on file     FAMILY HISTORY:  We obtained a detailed, 4-generation family history.  Significant diagnoses are listed below: Family History  Problem Relation Age of Onset  . Diabetes Sister   . Hypertension Sister   . Breast cancer Sister 84  . Diabetes Brother   . Breast cancer Maternal Grandmother 61  . Cervical cancer Mother   . Prostate cancer Father 67       metastatic to bones  . Cancer Maternal Aunt        type unk dx <50  . Skin cancer Paternal Uncle   . Pulmonary embolism Maternal Grandfather 34  . Cancer Paternal Uncle   . Breast cancer Other   . Stomach cancer Other    Martha Lozano has a 42 year-old daughter with no history of cancer. Martha Lozano has 3 grandchildren.  Martha Lozano has 2 sisters and 1 brother.  Her brother died  in his 16's with no history of cancer.  He had 1 child.  Martha Lozano has 1 sister who has had 13 skin cancers removed- unk type.  This sister has 2 children.  Martha Lozano other sister is 57, and currently being treated for breast cancer. She had genetic testing that was reportedly negative.  She has 1 child.    Martha Lozano father died from metastatic prostate cancer that spread to his bones in his 85's.  Martha Lozano has 7-8 paternal uncles/aunts. 1 uncle had cancer- type unk, 1 uncle had skin cancer.  Limited information is available about these relatives.  Martha Lozano does not the know health information about her paternal cousins.  Martha Lozano does not have information about her paternal grandfather.  Her paternal grandmother died in her 52's, but she has limited information about her health.   Martha Lozano mother had cervical cancer, and died in her 31's due to a motor vehicle accident.  Martha Lozano has 2 maternal aunts who are deceased.  One had cancer <50, but the type of cancer is unk.  Martha Lozano also has a maternal uncle who is deceased with no history of cancer.  No maternal relatives with any history of cancer, but limited information about these relatives.  Martha Lozano maternal grandfather died in his 63's due to a pulmonary embolism.  Martha Lozano maternal grandmother died in her late 98's due to breast cancer dx in her 104's.  This grandmother's mother ([place]) had breast and stomach cancer.   Patient's maternal ancestors are of Caucasian descent, and paternal ancestors are of Caucasian descent. There is no reported Ashkenazi Jewish ancestry. There is no known consanguinity.  GENETIC COUNSELING ASSESSMENT: Martha Lozano is a 57 y.o. female with a personal and family history which is somewhat suggestive of a Hereditary Cancer Predisposition Syndrome. We, therefore, discussed and recommended the following at today's visit.   DISCUSSION: We reviewed the  characteristics, features and inheritance patterns of hereditary cancer syndromes. We also discussed genetic testing, including the appropriate family members to test, the process of testing, insurance coverage and turn-around-time for results. We discussed the implications of a negative, positive and/or variant of uncertain significant result. We recommended Ms. Mcginty pursue genetic testing for the Common Hereditary Cancers Panel + Melanoma/Basal Cell Nevus Syndrome panel gene panel.   [facility] Basal Cell Nevus Syndrome Panel: PTCH1, SUFU  [facility] Common Hereditary Cancers Panel Primary panel: APC, ATM, AXIN2, BARD1, BMPR1A, BRCA1, BRCA2, BRIP1, CDH1,  CDK4, CDKN2A, CHEK2, CTNNA1, DICER1, EPCAM, GREM1, HOXB13, KIT, MEN1, MLH1, MSH2, MSH3, MSH6, MUTYH, NBN, NF1, NTHL1, PALB2, PDGFRA, PMS2, POLD1, POLE, PTEN, RAD50, RAD51C, RAD51D, SDHA, SDHB, SDHC, SDHD, SMAD4, SMARCA4, STK11, TP53, TSC1, TSC2, VHL  [facility] Melanoma Panel:  BAP1, BRCA2, CDK4, CDKN2A, MITF, POT1, PTEN, RB1, TP53  We discussed that only 5-10% of cancers are associated with a Hereditary cancer predisposition syndrome.  One of the most common hereditary cancer syndromes that increases breast cancer risk is called Hereditary Breast and Ovarian Cancer (HBOC) syndrome.  This syndrome is caused by mutations in the BRCA1 and BRCA2 genes.  This syndrome increases an individual's lifetime risk to develop breast, ovarian, pancreatic, and other types of cancer.  There are also many other cancer predisposition syndromes caused by mutations in several other genes.  We discussed that if she is found to have a mutation in one of these genes, it may impact surgical decisions, and alter future medical management recommendations such as increased cancer screenings and consideration of risk reducing surgeries.  A positive result could also have implications for the patient's family members.  A Negative result would mean we were unable to identify a  hereditary component to her cancer, but does not rule out the possibility of a hereditary basis for her cancer.  There could be mutations that are undetectable by current technology, or in genes not yet tested or identified to increase cancer risk.    We discussed the potential to find a Variant of Uncertain Significance or VUS.  These are variants that have not yet been identified as pathogenic or benign, and it is unknown if this variant is associated with increased cancer risk or if this is a normal finding.  Most VUS's are reclassified to benign or likely benign.   It should not be used to make medical management decisions. With time, we suspect the lab will determine the significance of any VUS's identified if any.   Based on Ms. Eischeid's personal and family history of cancer, she meets medical criteria for genetic testing. Despite that she meets criteria, she may still have an out of pocket cost. We discussed that if her out of pocket cost for testing is over $100, the laboratory will call and confirm whether she wants to proceed with testing.  If the out of pocket cost of testing is less than $100 she will be billed by the genetic testing laboratory.   PLAN: After considering the risks, benefits, and limitations, Ms. Kamer  provided informed consent to pursue genetic testing and the blood sample was sent to [facility] for analysis of the Common Hereditary Cancer Panel + Melanoma Panel + Basal Cell Nevus Syndrome Panel. Results should be available within approximately 2-3 weeks' time, at which point they will be disclosed by telephone to Ms. Woodmansee, as will any additional recommendations warranted by these results. Ms. Grumbine will receive a summary of her genetic counseling visit and a copy of her results once available. This information will also be available in Epic. We encouraged Ms. Ramseyer to remain in contact with cancer genetics annually so that we can continuously update  the family history and inform her of any changes in cancer genetics and testing that may be of benefit for her family. Ms. Sailer questions were answered to her satisfaction today. Our contact information was provided should additional questions or concerns arise.  Based on Ms. Cartwright's family history, we recommended her siblings and paternal relatives are also recommended to, have genetic counseling  and testing. Ms. Wolfley will let us know if we can be of any assistance in coordinating genetic counseling and/or testing for this family member.   Lastly, we encouraged Ms. Stifter to remain in contact with cancer genetics annually so that we can continuously update the family history and inform her of any changes in cancer genetics and testing that may be of benefit for this family.   Ms.  Rappa questions were answered to her satisfaction today. Our contact information was provided should additional questions or concerns arise. Thank you for the referral and allowing [place] to share in the care of your patient.   Tana Felts, MS, [facility] Certified Genetic Counselor [email or web address] phone: [phone]  The patient was seen for a total of 40 minutes in face-to-face genetic counseling.  The patient was accompanied today by her sister, Mariann Laster.  This patient was discussed with Drs. Magrinat, Martha Lozano and/or Burr Medico who agrees with the above.

## 2017-10-28 ENCOUNTER — Other Ambulatory Visit: Payer: Self-pay | Admitting: Medical Oncology

## 2017-10-28 DIAGNOSIS — D0511 Intraductal carcinoma in situ of right breast: Secondary | ICD-10-CM

## 2017-10-29 ENCOUNTER — Inpatient Hospital Stay: Payer: Medicaid Other | Admitting: Medical Oncology

## 2017-10-29 ENCOUNTER — Encounter: Payer: Self-pay | Admitting: Medical Oncology

## 2017-10-29 ENCOUNTER — Inpatient Hospital Stay: Payer: Medicaid Other

## 2017-10-29 DIAGNOSIS — D0511 Intraductal carcinoma in situ of right breast: Secondary | ICD-10-CM

## 2017-10-29 LAB — RESEARCH LABS

## 2017-10-29 NOTE — Progress Notes (Signed)
COMET Patient in to clinic this morning for completion of study baseline questionnaires and blood draw.  Denny LevyLeonetti, Bren Steers, RN, BSN Clinical Research 10/29/17 10:54 PM

## 2017-10-30 ENCOUNTER — Other Ambulatory Visit: Payer: Self-pay

## 2017-10-30 MED ORDER — TAMOXIFEN CITRATE 20 MG PO TABS: 20.0000 mg | ORAL_TABLET | Freq: Every day | ORAL | 0 refills | Status: DC

## 2017-11-01 ENCOUNTER — Telehealth: Payer: Self-pay | Admitting: Genetics

## 2017-11-01 NOTE — Telephone Encounter (Signed)
Revealed negative genetic testing.  Revealed that a VUS in MSH3 was identified.   This normal result is reassuring and indicates that it is unlikely Martha Lozano's cancer is due to a hereditary cause.  It is unlikely that there is an increased risk of another cancer due to a mutation in one of these genes.  However, genetic testing is not perfect, and cannot definitively rule out a hereditary cause.  It will be important for her to keep in contact with genetics to learn if any additional testing may be needed in the future.   Her other paternal relatives could do genetic testing as well due to her father's metastatic prostate cancer.

## 2017-11-08 ENCOUNTER — Encounter: Payer: Self-pay | Admitting: Genetics

## 2017-11-08 ENCOUNTER — Ambulatory Visit: Payer: Self-pay | Admitting: Genetics

## 2017-11-08 DIAGNOSIS — Z1379 Encounter for other screening for genetic and chromosomal anomalies: Secondary | ICD-10-CM | POA: Insufficient documentation

## 2017-11-08 DIAGNOSIS — D0511 Intraductal carcinoma in situ of right breast: Secondary | ICD-10-CM

## 2017-11-08 DIAGNOSIS — Z803 Family history of malignant neoplasm of breast: Secondary | ICD-10-CM

## 2017-11-08 DIAGNOSIS — Z8042 Family history of malignant neoplasm of prostate: Secondary | ICD-10-CM

## 2017-11-08 DIAGNOSIS — Z808 Family history of malignant neoplasm of other organs or systems: Secondary | ICD-10-CM

## 2017-11-08 NOTE — Progress Notes (Signed)
HPI:  Ms. Railsback was previously seen in the [facility] clinic on 10/23/2017 due to a personal and family history of cancer and concerns regarding a hereditary predisposition to cancer. Please refer to our prior cancer genetics clinic note for more information regarding Ms. Liddell's medical, social and family histories, and our assessment and recommendations, at the time. Ms. Crossin recent genetic test results were disclosed to her, as well as recommendations warranted by these results. These results and recommendations are discussed in more detail below.  CANCER HISTORY:    Ductal carcinoma in situ (DCIS) of right breast   09/11/2017 Initial Diagnosis    Pleomorphic and branching linear calcifications right breast lateral aspect, stereotactic biopsy of right UOQ and right posterior UOQ revealed intermediate grade DCIS ER 100%, PR 90%, Tis NX stage 0      11/01/2017 Genetic Testing    The Common Hereditary Cancers Panel + Melanoma Panel + Basal Cell Nevus panel was ordered (53 genes). The following genes were evaluated for sequence changes and exonic deletions/duplications: APC, ATM, AXIN2, BAP1, BARD1, BMPR1A, BRCA1, BRCA2, BRIP1, CDH1, CDK4, CDKN2A (p14ARF), CDKN2A (p16INK4a), CHEK2, CTNNA1, DICER1, EPCAM*, GREM1*, KIT, MEN1, MLH1, MSH2, MSH3, MSH6, MUTYH, NBN, NF1, PALB2, PDGFRA, PMS2, POLD1, POLE, POT1, PTCH1, PTEN, RAD50, RAD51C, RAD51D, RB1, SDHB, SDHC, SDHD, SMAD4, SMARCA4, STK11, SUFU, TP53, TSC1, TSC2, VHL. The following genes were evaluated for sequence changes only: HOXB13*, MITF*, NTHL1*, SDHA    Results: No pathogenic variants identified.  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  VUS's should not be used to make medical management decisions.  The date of this test report is 11/01/2017.         FAMILY HISTORY:  We obtained a detailed, 4-generation family history.  Significant diagnoses are listed  below: Family History  Problem Relation Age of Onset  . Diabetes Sister   . Hypertension Sister   . Breast cancer Sister 61  . Diabetes Brother   . Breast cancer Maternal Grandmother 74  . Cervical cancer Mother   . Prostate cancer Father 57       metastatic to bones  . Cancer Maternal Aunt        type unk dx <50  . Skin cancer Paternal Uncle   . Pulmonary embolism Maternal Grandfather 82  . Cancer Paternal Uncle   . Breast cancer Other   . Stomach cancer Other     Ms. Hauswirth has a 27 year-old daughter with no history of cancer. Ms. Wanninger has 3 grandchildren.  Ms. Wineland has 2 sisters and 1 brother.  Her brother died in his 58's with no history of cancer.  He had 1 child.  Ms. Shealy has 1 sister who has had 13 skin cancers removed- unk type.  This sister has 2 children.  Ms. Lessig other sister is 37, and currently being treated for breast cancer. She had genetic testing that was reportedly negative.  She has 1 child.    Ms. Minium father died from metastatic prostate cancer that spread to his bones in his 48's.  Ms. Heacock has 7-8 paternal uncles/aunts. 1 uncle had cancer- type unk, 1 uncle had skin cancer.  Limited information is available about these relatives.  Ms. Iyer does not the know health information about her paternal cousins.  Ms. Davia does not have information about her paternal grandfather.  Her paternal grandmother died in her 53's, but she has limited information about her health.   Ms.  Egle's mother had cervical cancer, and died in her 59's due to a motor vehicle accident.  Ms. Hinde has 2 maternal aunts who are deceased.  One had cancer <50, but the type of cancer is unk.  Ms. Lomax also has a maternal uncle who is deceased with no history of cancer.  No maternal relatives with any history of cancer, but limited information about these relatives.  Ms. Novick maternal grandfather died in his 18's due to a pulmonary  embolism.  Ms. Cheek maternal grandmother died in her late 54's due to breast cancer dx in her 32's.  This grandmother's mother ([place]) had breast and stomach cancer.   Patient's maternal ancestors are of Caucasian descent, and paternal ancestors are of Caucasian descent. There is no reported Ashkenazi Jewish ancestry. There is no known consanguinity.  GENETIC TEST RESULTS: Genetic testing performed through [facility]'s Common Hereditary Cancers Panel + Melanoma Panel + Basal Cell Nevus syndrome panel reported out on 11/01/2017 showed no pathogenic mutations. The following genes were evaluated for sequence changes and exonic deletions/duplications: APC, ATM, AXIN2, BAP1, BARD1, BMPR1A, BRCA1, BRCA2, BRIP1, CDH1, CDK4, CDKN2A (p14ARF), CDKN2A (p16INK4a), CHEK2, CTNNA1, DICER1, EPCAM*, GREM1*, KIT, MEN1, MLH1, MSH2, MSH3, MSH6, MUTYH, NBN, NF1, PALB2, PDGFRA, PMS2, POLD1, POLE, POT1, PTCH1, PTEN, RAD50, RAD51C, RAD51D, RB1, SDHB, SDHC, SDHD, SMAD4, SMARCA4, STK11, SUFU, TP53, TSC1, TSC2, VHL. The following genes were evaluated for sequence changes only: HOXB13*, MITF*, NTHL1*, SDHA.  A variant of uncertain significance (VUS) in a gene called MSH3 was also noted. c.2041C>T (p.Pro681Ser)  The test report will be scanned into EPIC and will be located under the Molecular Pathology section of the Results Review tab. A portion of the result report is included below for reference.     We discussed with Ms. Zabawa that because current genetic testing is not perfect, it is possible there may be a gene mutation in one of these genes that current testing cannot detect, but that chance is small.  We also discussed, that there could be another gene that has not yet been discovered, or that we have not yet tested, that is responsible for the cancer diagnoses in the family. It is also possible there is a hereditary cause for the cancer in the family that Ms. Maslanka did not inherit and therefore was not identified  in her testing.  Therefore, it is important to remain in touch with cancer genetics in the future so that we can continue to offer Ms. Belitz the most up to date genetic testing.   Regarding the VUS in MSH3: At this time, it is unknown if this variant is associated with increased cancer risk or if this is a normal finding, but most variants such as this get reclassified to being inconsequential. It should not be used to make medical management decisions. With time, we suspect the lab will determine the significance of this variant, if any. If we do learn more about it, we will try to contact Ms. Cristiano to discuss it further. However, it is important to stay in touch with [place] periodically and keep the address and phone number up to date.  ADDITIONAL GENETIC TESTING: We discussed with Ms. Peppel that there are other genes that are associated with increased cancer risk that can be analyzed. The laboratories that offer this testing look at these additional genes via a hereditary cancer gene panel. Should Ms. Lamos wish to pursue additional genetic testing, we are happy to discuss and coordinate this testing, at any time.  CANCER SCREENING RECOMMENDATIONS: This negative result means that we were unable to identify a hereditary cause for her personal and family history of cancer at this time.  While reassuring, this result does not rule out a hereditary cause for her cancer.   It is still possible that there could be genetic mutations that are undetectable by current technology, or genetic mutations in genes that have not been tested or identified to increase cancer risk.  Therefore, it is recommended she continue to follow the cancer management and screening guidelines provided by her oncology and primary healthcare provider. An individual's cancer risk is not determined by genetic test results alone.  Overall cancer risk assessment includes additional factors such as personal medical history,  family history, etc.  These should be used to make a personalized plan for cancer prevention and surveillance.    RECOMMENDATIONS FOR FAMILY MEMBERS:  Relatives in this family might be at some increased risk of developing cancer, over the general population risk, simply due to the family history of cancer.  We recommended women in this family have a yearly mammogram beginning at age 59, or 83 years younger than the earliest onset of cancer, an annual clinical breast exam, and perform monthly breast self-exams. Women in this family should also have a gynecological exam as recommended by their primary provider. All family members should have a colonoscopy by age 71 (or as directed by their doctors).  All family members should inform their physicians about the family history of cancer so their doctors can make the most appropriate screening recommendations for them.   It is also possible there is a hereditary cause for the cancer in Ms. Eardley's family that she did not inherit and therefore was not identified in her.   We recommended her siblings, and other paternal relatives, have genetic counseling and testing. Ms. Dede will let us know if we can be of any assistance in coordinating genetic counseling and/or testing for these family members.   FOLLOW-UP: Lastly, we discussed with Ms. Ante that cancer genetics is a rapidly advancing field and it is possible that new genetic tests will be appropriate for her and/or her family members in the future. We encouraged her to remain in contact with cancer genetics on an annual basis so we can update her personal and family histories and let her know of advances in cancer genetics that may benefit this family.   Our contact number was provided. Ms. Hunn questions were answered to her satisfaction, and she knows she is welcome to call us at anytime with additional questions or concerns.   Ferol Luz, MS, [facility] Certified Genetic  Counselor [email or web address]

## 2017-11-11 ENCOUNTER — Telehealth: Payer: Self-pay | Admitting: Medical Oncology

## 2017-11-11 NOTE — Telephone Encounter (Signed)
Call to patient to inquire whether she had started her tamoxifen prescription and patient reports to have started it on April 18th, 2019. Patient reports no issues since starting. Also inquired with patient regarding her menopausal status, patient reports to have not had menses for approximately 15 years now. I thanked patient for her time and encouraged her to call Dr. Pamelia Hoit or myself with questions.  Patient reports that Dr. Pamelia Hoit wanted her to follow up with a clinic visit in 3 months, appointment not scheduled at this time.  Denny Levy, RN, BSN Clinical Research 11/11/2017 2:59 PM

## 2017-11-18 ENCOUNTER — Telehealth: Payer: Self-pay

## 2017-11-18 NOTE — Telephone Encounter (Signed)
Pt transferred to this RN from Architectural technologist. Pt requesting a letter stating cancer diagnosis and treatment starting since feb/march 2019. Pt needs this letter to give to her attorney. Pt would like for letter to be mailed to her home. Will have letter mailed by tomorrow as requested.

## 2017-11-22 ENCOUNTER — Other Ambulatory Visit: Payer: Self-pay | Admitting: Hematology and Oncology

## 2018-01-29 ENCOUNTER — Emergency Department (HOSPITAL_COMMUNITY)
Admission: EM | Admit: 2018-01-29 | Discharge: 2018-01-29 | Disposition: A | Discharge status: Home / Self Care | Payer: Medicaid Other | Attending: Emergency Medicine | Admitting: Emergency Medicine

## 2018-01-29 ENCOUNTER — Emergency Department (HOSPITAL_COMMUNITY): Payer: Medicaid Other

## 2018-01-29 ENCOUNTER — Encounter (HOSPITAL_COMMUNITY): Payer: Self-pay | Admitting: Emergency Medicine

## 2018-01-29 DIAGNOSIS — Z79899 Other long term (current) drug therapy: Secondary | ICD-10-CM | POA: Insufficient documentation

## 2018-01-29 DIAGNOSIS — Y999 Unspecified external cause status: Secondary | ICD-10-CM | POA: Diagnosis not present

## 2018-01-29 DIAGNOSIS — Y92008 Other place in unspecified non-institutional (private) residence as the place of occurrence of the external cause: Secondary | ICD-10-CM | POA: Insufficient documentation

## 2018-01-29 DIAGNOSIS — Y9389 Activity, other specified: Secondary | ICD-10-CM | POA: Diagnosis not present

## 2018-01-29 DIAGNOSIS — E119 Type 2 diabetes mellitus without complications: Secondary | ICD-10-CM | POA: Diagnosis not present

## 2018-01-29 DIAGNOSIS — S39012A Strain of muscle, fascia and tendon of lower back, initial encounter: Secondary | ICD-10-CM | POA: Insufficient documentation

## 2018-01-29 DIAGNOSIS — F17210 Nicotine dependence, cigarettes, uncomplicated: Secondary | ICD-10-CM | POA: Diagnosis not present

## 2018-01-29 DIAGNOSIS — W109XXA Fall (on) (from) unspecified stairs and steps, initial encounter: Secondary | ICD-10-CM | POA: Diagnosis not present

## 2018-01-29 DIAGNOSIS — Y92009 Unspecified place in unspecified non-institutional (private) residence as the place of occurrence of the external cause: Secondary | ICD-10-CM

## 2018-01-29 DIAGNOSIS — M5431 Sciatica, right side: Secondary | ICD-10-CM | POA: Insufficient documentation

## 2018-01-29 DIAGNOSIS — W19XXXA Unspecified fall, initial encounter: Secondary | ICD-10-CM

## 2018-01-29 DIAGNOSIS — Z7984 Long term (current) use of oral hypoglycemic drugs: Secondary | ICD-10-CM | POA: Diagnosis not present

## 2018-01-29 DIAGNOSIS — S3982XA Other specified injuries of lower back, initial encounter: Secondary | ICD-10-CM | POA: Diagnosis present

## 2018-01-29 MED ORDER — HYDROCODONE-ACETAMINOPHEN 5-325 MG PO TABS
1.0000 | ORAL_TABLET | Freq: Once | ORAL | Status: AC
  Administered 2018-01-29: 1 via ORAL
  Filled 2018-01-29: qty 1

## 2018-01-29 MED ORDER — PREDNISONE 50 MG PO TABS: 50.0000 mg | ORAL_TABLET | Freq: Every day | ORAL | 0 refills | Status: DC

## 2018-01-29 MED ORDER — TRAMADOL HCL 50 MG PO TABS: 50.0000 mg | ORAL_TABLET | Freq: Four times a day (QID) | ORAL | 0 refills | Status: DC | PRN

## 2018-01-29 MED ORDER — CYCLOBENZAPRINE HCL 10 MG PO TABS: 10.0000 mg | ORAL_TABLET | Freq: Three times a day (TID) | ORAL | 0 refills | Status: DC | PRN

## 2018-01-29 NOTE — ED Notes (Signed)
Pt c/o pain in the right side and mid lower back after falling down on the stairs, pt sts pain radiates to her right leg and foot and her foot and toes and "going to sleep"

## 2018-01-29 NOTE — ED Triage Notes (Addendum)
Patient here from home with complaints of fall this morning and lower back pain. Denies hitting head no LOC. Ambulatory.

## 2018-01-29 NOTE — ED Provider Notes (Signed)
[facility]-EMERGENCY DEPT Provider Note   CSN: [id number] Arrival date & time: 01/29/18  0810     History   Chief Complaint Chief Complaint  Patient presents with  . Fall  . Back Pain    HPI Martha Lozano is a 57 y.o. female.  HPI Patient presents to the emergency department with lower back pain following a fall that occurred just prior to arrival.  The patient states that she was going out and rushing down her front steps when she fell landing on her bottom.  Patient states that she is having lower back discomfort since that time.  Patient states she did not take any medications prior to arrival.  The patient denies chest pain, shortness of breath, headache,blurred vision, neck pain, fever, cough, weakness, numbness, dizziness, anorexia, edema, abdominal pain, nausea, vomiting, diarrhea, rash,  dysuria, hematemesis, bloody stool, near syncope, or syncope. Past Medical History:  Diagnosis Date  . Diabetes mellitus without complication (HCC)   . Family history of breast cancer   . Family history of prostate cancer   . Family history of skin cancer   . Hep C w/o coma, chronic [facility])     Patient Active Problem List   Diagnosis Date Noted  . Genetic testing 11/08/2017  . Family history of prostate cancer   . Family history of breast cancer   . Family history of skin cancer   . 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 Polysubstance abuse (HCC) 03/02/2014  . Elevated BP 03/02/2014  . History of hepatitis C 03/02/2014  . Abnormal LFTs 03/02/2014  . Smoking 03/02/2014  . Thrombocytopenia, unspecified (HCC) 03/02/2014  . Paresthesia of both feet 03/02/2014  . Diabetes mellitus due to underlying condition without complications (HCC) 03/02/2014    History reviewed. No pertinent surgical history.   OB History    Gravida  2   Para  1   Term  1   Preterm      AB  1   Living        SAB      TAB  1   Ectopic      Multiple      Live Births               Home Medications    Prior to Admission medications   Medication Sig Start Date End Date Taking? Authorizing Provider  Buprenorphine HCl-Naloxone HCl (SUBOXONE) 12-3 MG FILM Place 1 Film under the tongue 2 (two) times daily.    Yes [provider]  [employer] D3 5000 units capsule Take 5,000 Units by mouth daily. 12/06/17  Yes [provider]  gabapentin (NEURONTIN) 300 MG capsule Take 600 mg by mouth 4 (four) times daily.    Yes [provider]  glipiZIDE (GLUCOTROL XL) 5 MG 24 hr tablet Take 5 mg by mouth daily with breakfast.   Yes [provider]  levothyroxine (SYNTHROID, LEVOTHROID) 25 MCG tablet Take 25 mcg by mouth daily before breakfast.   Yes [provider]  losartan (COZAAR) 50 MG tablet Take 50 mg by mouth daily.   Yes [provider]  metFORMIN (GLUCOPHAGE) 500 MG tablet Take 1 tablet (500 mg total) by mouth daily with breakfast. Patient taking differently: Take 1,000 mg by mouth 2 (two) times daily with a meal.  03/02/14  Yes Advani, Deepak, MD  metoprolol tartrate (LOPRESSOR) 50 MG tablet Take 50 mg by mouth daily.   Yes [provider]  tamoxifen (NOLVADEX) 20 MG tablet TAKE 1 TABLET BY MOUTH EVERY DAY 11/25/17  Yes Serena Croissant, MD  cyclobenzaprine (FLEXERIL) 10 MG tablet Take 1 tablet (10 mg total) by mouth 3 (three) times daily as needed for muscle spasms. 01/29/18   Baily Hovanec, Cristal Deer, PA-C  predniSONE (DELTASONE) 50 MG tablet Take 1 tablet (50 mg total) by mouth daily with breakfast. 01/29/18   Aamilah Augenstein, Cristal Deer, PA-C  traMADol (ULTRAM) 50 MG tablet Take 1 tablet (50 mg total) by mouth every 6 (six) hours as needed for severe pain. 01/29/18   Charlestine Night, PA-C    Family History Family History  Problem Relation Age of Onset  . Diabetes Sister   . Hypertension Sister   . Breast cancer Sister 88  . Diabetes Brother   . Breast cancer Maternal Grandmother 50  .  Cervical cancer Mother   . Prostate cancer Father 45       metastatic to bones  . Cancer Maternal Aunt        type unk dx <50  . Skin cancer Paternal Uncle   . Pulmonary embolism Maternal Grandfather 58  . Cancer Paternal Uncle   . Breast cancer Other   . Stomach cancer Other     Social History Social History   Tobacco Use  . Smoking status: Current Every Day Smoker    Packs/day: 0.50    Years: 35.00    Pack years: 17.50    Types: Cigarettes  . Smokeless tobacco: Never Used  Substance Use Topics  . Alcohol use: Yes    Comment: Occasional  . Drug use: No    Comment: history substance abuse      Allergies   Patient has no known allergies.   Review of Systems Review of Systems  All other systems negative except as documented in the HPI. All pertinent positives and negatives as reviewed in the HPI. Physical Exam Updated Vital Signs BP 132/84 (BP Location: Right Arm)   Pulse 90   Temp 97.6 F (36.4 C) (Oral)   Resp 16   SpO2 100%   Physical Exam  Constitutional: She is oriented to person, place, and time. She appears well-developed and well-nourished. No distress.  HENT:  Head: Normocephalic and atraumatic.  Mouth/Throat: Oropharynx is clear and moist.  Eyes: Pupils are equal, round, and reactive to light.  Neck: Normal range of motion. Neck supple.  Cardiovascular: Normal rate, regular rhythm and normal heart sounds. Exam reveals no gallop and no friction rub.  No murmur heard. Pulmonary/Chest: Effort normal and breath sounds normal. No respiratory distress. She has no wheezes.  Abdominal: Soft. Bowel sounds are normal. She exhibits no distension. There is no tenderness.  Musculoskeletal:       Lumbar back: She exhibits tenderness and pain. She exhibits no bony tenderness, no swelling, no edema, no deformity and no spasm.       Back:  Neurological: She is alert and oriented to person, place, and time. She exhibits normal muscle tone. Coordination normal.    Skin: Skin is warm and dry. Capillary refill takes less than 2 seconds. No rash noted. No erythema.  Psychiatric: She has a normal mood and affect. Her behavior is normal.  Nursing note and vitals reviewed.    ED Treatments / Results  Labs (all labs ordered are listed, but only abnormal results are displayed) Labs Reviewed - No data to display  EKG None  Radiology Dg Lumbar Spine Complete  Result Date: 01/29/2018 CLINICAL DATA:  Pain following fall  EXAM: LUMBAR SPINE - COMPLETE 4+ VIEW COMPARISON:  June 22, 2016 FINDINGS: Frontal, lateral, spot lumbosacral lateral, and bilateral oblique views were obtained. There are 5 non-rib-bearing lumbar type vertebral bodies. No fracture or spondylolisthesis. There is moderate disc space narrowing at L5-S1. Other disc spaces appear unremarkable. There is facet osteoarthritic change at L5-S1 bilaterally. No erosive change. There is aortic atherosclerosis. IMPRESSION: Osteoarthritic change at L5-S1. No fracture or spondylolisthesis. There is aortic atherosclerosis. Aortic Atherosclerosis (ICD10-I70.0). Electronically Signed   By: Bretta BangWilliam  Woodruff III M.D.   On: 01/29/2018 10:40    Procedures Procedures (including critical care time)  Medications Ordered in ED Medications  HYDROcodone-acetaminophen (NORCO/VICODIN) 5-325 MG per tablet 1 tablet (1 tablet Oral Given 01/29/18 1155)     Initial Impression / Assessment and Plan / ED Course  I have reviewed the triage vital signs and the nursing notes.  Pertinent labs & imaging results that were available during my care of the patient were reviewed by me and considered in my medical decision making (see chart for details).    She has no neurological deficits noted on exam.  The patient be treated for sciatica.  She has no abnormal deep tendon reflexes and normal strength of her lower extremities.  Patient is advised to follow-up with her primary doctor told return here as needed.  The patient  agrees with plan and all questions were answered. Final Clinical Impressions(s) / ED Diagnoses   Final diagnoses:  Fall in home, initial encounter  Strain of lumbar region, initial encounter  Sciatica of right side    ED Discharge Orders        Ordered    predniSONE (DELTASONE) 50 MG tablet  Daily with breakfast     01/29/18 1301    cyclobenzaprine (FLEXERIL) 10 MG tablet  3 times daily PRN     01/29/18 1301    traMADol (ULTRAM) 50 MG tablet  Every 6 hours PRN     01/29/18 1301       Charlestine NightLawyer, Kimbley Sprague, PA-C 01/29/18 1605    Gerhard MunchLockwood, Robert, MD 01/29/18 1626

## 2018-01-29 NOTE — Discharge Instructions (Addendum)
Return here as needed.  Follow-up with your doctor for recheck.  Use ice and heat on your back.  Your x-rays did not show any abnormalities.

## 2018-02-21 ENCOUNTER — Telehealth: Payer: Self-pay

## 2018-02-21 ENCOUNTER — Other Ambulatory Visit: Payer: Self-pay

## 2018-02-21 MED ORDER — TAMOXIFEN CITRATE 20 MG PO TABS: 20.0000 mg | ORAL_TABLET | Freq: Every day | ORAL | 0 refills | Status: DC

## 2018-02-21 NOTE — Telephone Encounter (Signed)
Per research, patient is participating in the comet study.  No medications are involved with this study.  Nurse notified patient, refill for Tamoxifen is needed.  Will send refill.  Patient aware voiced understanding.

## 2018-02-21 NOTE — Telephone Encounter (Signed)
Received call from patient regarding concerns with clinical research meds that she is taking and needs to speak with someone. Called pt and lvm with call back number. Will forward request to clinical research nurse, Mira,RN.

## 2018-02-21 NOTE — Telephone Encounter (Signed)
Returned patient's call regarding medication for research trial.  Patient stated, "I only have 5 pills left and just need to know what to do next."  Research team notified, they will contact patient with further instructions.  Patient aware and has no further needs at this time.

## 2018-02-24 ENCOUNTER — Telehealth: Payer: Self-pay | Admitting: Medical

## 2018-02-24 ENCOUNTER — Telehealth: Payer: Self-pay | Admitting: Hematology and Oncology

## 2018-02-24 NOTE — Telephone Encounter (Signed)
Spoke to patient regarding upcoming aug appts per 8/9 sch message  °

## 2018-02-24 NOTE — Telephone Encounter (Signed)
Patient scheduled per 8/12 sch message  °

## 2018-02-26 ENCOUNTER — Inpatient Hospital Stay: Payer: Medicaid Other | Attending: Hematology and Oncology | Admitting: Hematology and Oncology

## 2018-02-26 DIAGNOSIS — D0511 Intraductal carcinoma in situ of right breast: Secondary | ICD-10-CM | POA: Insufficient documentation

## 2018-02-26 DIAGNOSIS — R61 Generalized hyperhidrosis: Secondary | ICD-10-CM | POA: Diagnosis not present

## 2018-02-26 DIAGNOSIS — Z170 Estrogen receptor positive status [ER+]: Secondary | ICD-10-CM | POA: Diagnosis not present

## 2018-02-26 NOTE — Assessment & Plan Note (Signed)
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

## 2018-02-26 NOTE — Progress Notes (Signed)
Patient Care Team: Placey, Audrea Muscat, NP as PCP - General Leonetti, Charleen Kirks, RN as Registered Nurse (Medical Oncology)  DIAGNOSIS:  Encounter Diagnosis  Name Primary?  . 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 The following genes were evaluated for sequence changes and exonic deletions/duplications: APC, ATM, AXIN2, BAP1, BARD1, BMPR1A, BRCA1, BRCA2, BRIP1, CDH1, CDK4, CDKN2A (p14ARF), CDKN2A (p16INK4a), CHEK2, CTNNA1, DICER1, EPCAM*, GREM1*, KIT, MEN1, MLH1, MSH2, MSH3, MSH6, MUTYH, NBN, NF1, PALB2, PDGFRA, PMS2, POLD1, POLE, POT1, PTCH1, PTEN, RAD50, RAD51C, RAD51D, RB1, SDHB, SDHC, SDHD, SMAD4, SMARCA4, STK11, SUFU, TP53, TSC1, TSC2, VHL. The following genes were evaluated for sequence changes only: HOXB13*, MITF*, NTHL1*, SDHA    Results: No pathogenic variants identified.  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  VUS's should not be used to make medical management decisions.  The date of this test report is 11/01/2017.      CHIEF COMPLIANT: Follow-up of DCIS on tamoxifen therapy on Comet clinical trial  INTERVAL HISTORY: Martha Lozano is a 45-year with above-mentioned history of DCIS currently on Comet clinical trial and is currently on surveillance.  She takes tamoxifen appears to be tolerating it  extremely well.  She does not have any major hot flashes.  She does have sweats at night but these have been present even before.  She denies any lumps or nodules in the breast.  REVIEW OF SYSTEMS:   Constitutional: Denies fevers, chills or abnormal weight loss Eyes: Denies blurriness of vision Ears, nose, mouth, throat, and face: Denies mucositis or sore throat Respiratory: Denies cough, dyspnea or wheezes Cardiovascular: Denies palpitation, chest discomfort Gastrointestinal:  Denies nausea, heartburn or change in bowel habits Skin: Denies abnormal skin rashes Lymphatics: Denies new lymphadenopathy or easy bruising Neurological:Denies numbness, tingling or new weaknesses Behavioral/Psych: Mood is stable, no new changes  Extremities: No lower extremity edema Breast:  denies any pain or lumps or nodules in either breasts All other systems were reviewed with the patient and are negative.  I have reviewed the past medical history, past surgical history, social history and family history with the patient and they are unchanged from previous note.  ALLERGIES:  has No Known Allergies.  MEDICATIONS:  Current Outpatient Medications  Medication Sig Dispense Refill  . Buprenorphine HCl-Naloxone HCl (SUBOXONE) 12-3 MG FILM Place 1 Film under the tongue 2 (two) times daily.     . CVS D3 5000 units capsule Take 5,000 Units by mouth daily.  3  . cyclobenzaprine (FLEXERIL) 10 MG tablet Take 1 tablet (10 mg total) by mouth 3 (three) times daily as needed for muscle spasms. 15 tablet 0  . gabapentin (NEURONTIN) 300 MG capsule Take 600 mg by mouth 4 (four) times daily.     Marland Kitchen glipiZIDE (GLUCOTROL XL) 5 MG 24 hr tablet Take  5 mg by mouth daily with breakfast.    . levothyroxine (SYNTHROID, LEVOTHROID) 25 MCG tablet Take 25 mcg by mouth daily before breakfast.    . losartan (COZAAR) 50 MG tablet Take 50 mg by mouth daily.    . metFORMIN (GLUCOPHAGE) 500 MG tablet Take 1 tablet (500 mg total) by mouth daily  with breakfast. (Patient taking differently: Take 1,000 mg by mouth 2 (two) times daily with a meal. ) 90 tablet 1  . metoprolol tartrate (LOPRESSOR) 50 MG tablet Take 50 mg by mouth daily.    . predniSONE (DELTASONE) 50 MG tablet Take 1 tablet (50 mg total) by mouth daily with breakfast. 5 tablet 0  . tamoxifen (NOLVADEX) 20 MG tablet Take 1 tablet (20 mg total) by mouth daily. 90 tablet 0  . traMADol (ULTRAM) 50 MG tablet Take 1 tablet (50 mg total) by mouth every 6 (six) hours as needed for severe pain. 15 tablet 0   No current facility-administered medications for this visit.     PHYSICAL EXAMINATION: ECOG PERFORMANCE STATUS: 1 - Symptomatic but completely ambulatory  Vitals:   02/26/18 1141  BP: 125/72  Pulse: 78  Resp: 18  Temp: 98.6 F (37 C)  SpO2: 99%   Filed Weights   02/26/18 1141  Weight: 116 lb 9.6 oz (52.9 kg)    GENERAL:alert, no distress and comfortable SKIN: skin color, texture, turgor are normal, no rashes or significant lesions EYES: normal, Conjunctiva are pink and non-injected, sclera clear OROPHARYNX:no exudate, no erythema and lips, buccal mucosa, and tongue normal  NECK: supple, thyroid normal size, non-tender, without nodularity LYMPH:  no palpable lymphadenopathy in the cervical, axillary or inguinal LUNGS: clear to auscultation and percussion with normal breathing effort HEART: regular rate & rhythm and no murmurs and no lower extremity edema ABDOMEN:abdomen soft, non-tender and normal bowel sounds MUSCULOSKELETAL:no cyanosis of digits and no clubbing  NEURO: alert & oriented x 3 with fluent speech, no focal motor/sensory deficits EXTREMITIES: No lower extremity edema    LABORATORY DATA:  I have reviewed the data as listed CMP Latest Ref Rng & Units 03/09/2014 02/12/2014 12/23/2012  Glucose 70 - 99 mg/dL 161(H) 207(H) 141(H)  BUN 6 - 23 mg/dL 8 5(L) 5(L)  Creatinine 0.50 - 1.10 mg/dL 0.71 0.83 0.63  Sodium 135 - 145 mEq/L 138 136(L) 137    Potassium 3.5 - 5.3 mEq/L 4.1 5.0 4.0  Chloride 96 - 112 mEq/L 103 100 100  CO2 19 - 32 mEq/L 29 25 30   Calcium 8.4 - 10.5 mg/dL 8.7 8.9 8.8  Total Protein 6.0 - 8.3 g/dL 6.8 7.3 -  Total Bilirubin 0.2 - 1.2 mg/dL 1.9(H) 1.2 -  Alkaline Phos 39 - 117 U/L 137(H) 161(H) -  AST 0 - 37 U/L 64(H) 73(H) -  ALT 0 - 35 U/L 44(H) 49(H) -    Lab Results  Component Value Date   WBC 6.3 03/09/2014   HGB 14.8 03/09/2014   HCT 42.5 03/09/2014   MCV 94.0 03/09/2014   PLT 87 (L) 03/09/2014   NEUTROABS 2.7 03/09/2014    ASSESSMENT & PLAN:  Ductal carcinoma in situ (DCIS) of right breast 09/11/2017:Pleomorphic and branching linear calcifications right breast lateral aspect, stereotactic biopsy of right UOQ and right posterior UOQ revealed 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  She does have sweats  at night which have been there before as well.  Plan: Mammograms to be obtained in 3 months  Return to clinic in 3 months with mammogram of the right breast and follow-up    Orders Placed This Encounter  Procedures  . MM DIAG BREAST TOMO UNI RIGHT    Standing Status:   Future    Standing Expiration Date:   02/27/2019    Order Specific Question:   Reason for Exam (SYMPTOM  OR DIAGNOSIS REQUIRED)    Answer:   COMET trial DCIS monitoring. Please use clock face to describe findings    Order Specific Question:   Is the patient pregnant?    Answer:   No    Order Specific Question:   Preferred imaging location?    Answer:   [facility]   The patient has a good understanding of the overall plan. she agrees with it. she will call with any problems that may develop before the next visit here.   Harriette Ohara, MD 02/26/18

## 2018-02-27 ENCOUNTER — Telehealth: Payer: Self-pay | Admitting: Hematology and Oncology

## 2018-02-27 NOTE — Telephone Encounter (Signed)
Per 8/14 los.  Called patient regarding 3 month appointment.  Advised also that they would call her to set up mammogram.  Mailed calendar.

## 2018-04-16 ENCOUNTER — Telehealth: Payer: Self-pay | Admitting: Medical Oncology

## 2018-04-16 NOTE — Telephone Encounter (Signed)
COMET LVMOM with patient regarding her 6 month visit on study is approaching. Inquired with patient as to whether she has been scheduled yet for a mammogram. Asked patient to please call me back at her earliest convenience so that I can get her scheduled, if she has not been, and then schedule her to see Dr. Pamelia Hoit. I informed patient that I have a window of time that I have to have her scheduled in, per study requirements. Patient thanked.  Denny Levy, RN, BSN Clinical Research 04/16/2018 2:59 PM

## 2018-04-21 ENCOUNTER — Telehealth: Payer: Self-pay | Admitting: Medical Oncology

## 2018-04-21 NOTE — Telephone Encounter (Signed)
COMET Return call from patient informing that she does not have a mammogram appointment yet. I informed patient that I will have that scheduled for her at [facility], where she had previous one completed, and then will reschedule her follow up appointment with Dr. Pamelia Hoit, as required per study protocol. Patient states that she is available anytime for appointments. I thanked patient and informed her that I will call her back with both scheduled appointment times. Patient thanked and encouraged to call Dr. Pamelia Hoit or myself with any questions or concerns she may have. Denny Levy, RN, BSN Clinical Research 04/21/2018 1:56 PM

## 2018-04-22 ENCOUNTER — Telehealth: Payer: Self-pay | Admitting: Hematology and Oncology

## 2018-04-22 NOTE — Telephone Encounter (Signed)
COMET LVMOM with patient informing her of new appointment times. Informed patient that her mammogram has been scheduled for 10/15 @ 2:50 at the [facility], this is where she had her last mammogram and for her to arrive for check-in at 2:30. Also, her appointment with Dr. Pamelia Hoit has been rescheduled to 10/17 @ 2:00. Patient was asked to return call for confirmation or questions. Return number provided.  Denny Levy, RN, BSN Clinical Research 04/22/2018 4:54 PM

## 2018-04-22 NOTE — Telephone Encounter (Signed)
Hale Drone said she would let patient know about date and time

## 2018-04-29 ENCOUNTER — Other Ambulatory Visit: Payer: Self-pay | Admitting: Hematology and Oncology

## 2018-04-29 ENCOUNTER — Ambulatory Visit
Admission: RE | Admit: 2018-04-29 | Discharge: 2018-04-29 | Disposition: A | Discharge status: Home / Self Care | Payer: Medicaid Other | Source: Ambulatory Visit | Attending: Hematology and Oncology | Admitting: Hematology and Oncology

## 2018-04-29 DIAGNOSIS — D0511 Intraductal carcinoma in situ of right breast: Secondary | ICD-10-CM

## 2018-05-01 ENCOUNTER — Inpatient Hospital Stay: Payer: Medicaid Other | Attending: Hematology and Oncology | Admitting: Hematology and Oncology

## 2018-05-01 ENCOUNTER — Encounter: Payer: Self-pay | Admitting: Medical Oncology

## 2018-05-01 ENCOUNTER — Telehealth: Payer: Self-pay | Admitting: Hematology and Oncology

## 2018-05-01 DIAGNOSIS — Z79810 Long term (current) use of selective estrogen receptor modulators (SERMs): Secondary | ICD-10-CM

## 2018-05-01 DIAGNOSIS — Z170 Estrogen receptor positive status [ER+]: Secondary | ICD-10-CM | POA: Diagnosis not present

## 2018-05-01 DIAGNOSIS — Z006 Encounter for examination for normal comparison and control in clinical research program: Secondary | ICD-10-CM

## 2018-05-01 DIAGNOSIS — D0511 Intraductal carcinoma in situ of right breast: Secondary | ICD-10-CM | POA: Diagnosis present

## 2018-05-01 MED ORDER — TAMOXIFEN CITRATE 20 MG PO TABS: 20.0000 mg | ORAL_TABLET | Freq: Every day | ORAL | 3 refills | Status: DC

## 2018-05-01 NOTE — Progress Notes (Signed)
Patient Care Team: Placey, Audrea Muscat, NP as PCP - General Leonetti, Charleen Kirks, RN as Registered Nurse (Medical Oncology)  DIAGNOSIS:  Encounter Diagnosis  Name Primary?  . 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 The following genes were evaluated for sequence changes and exonic deletions/duplications: APC, ATM, AXIN2, BAP1, BARD1, BMPR1A, BRCA1, BRCA2, BRIP1, CDH1, CDK4, CDKN2A (p14ARF), CDKN2A (p16INK4a), CHEK2, CTNNA1, DICER1, EPCAM*, GREM1*, KIT, MEN1, MLH1, MSH2, MSH3, MSH6, MUTYH, NBN, NF1, PALB2, PDGFRA, PMS2, POLD1, POLE, POT1, PTCH1, PTEN, RAD50, RAD51C, RAD51D, RB1, SDHB, SDHC, SDHD, SMAD4, SMARCA4, STK11, SUFU, TP53, TSC1, TSC2, VHL. The following genes were evaluated for sequence changes only: HOXB13*, MITF*, NTHL1*, SDHA    Results: No pathogenic variants identified.  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  VUS's should not be used to make medical management decisions.  The date of this test report is 11/01/2017.      CHIEF COMPLIANT: Follow-up of DCIS on Comet clinical trial on tamoxifen  INTERVAL HISTORY: Martha Lozano is a 57 year old with above-mentioned history of DCIS who is currently on tamoxifen therapy.  She is tolerating tamoxifen extremely well.  She is on active surveillance on the Comet  clinical trial.  Recent mammograms appear to show stable disease.  REVIEW OF SYSTEMS:   Constitutional: Denies fevers, chills or abnormal weight loss Eyes: Denies blurriness of vision Ears, nose, mouth, throat, and face: Denies mucositis or sore throat Respiratory: Denies cough, dyspnea or wheezes Cardiovascular: Denies palpitation, chest discomfort Gastrointestinal:  Denies nausea, heartburn or change in bowel habits Skin: Denies abnormal skin rashes Lymphatics: Denies new lymphadenopathy or easy bruising Neurological:Denies numbness, tingling or new weaknesses Behavioral/Psych: Mood is stable, no new changes  Extremities: No lower extremity edema Breast:  denies any pain or lumps or nodules in either breasts All other systems were reviewed with the patient and are negative.  I have reviewed the past medical history, past surgical history, social history and family history with the patient and they are unchanged from previous note.  ALLERGIES:  has No Known Allergies.  MEDICATIONS:  Current Outpatient Medications  Medication Sig Dispense Refill  . Buprenorphine HCl-Naloxone HCl (SUBOXONE) 12-3 MG FILM Place 1 Film under the tongue 2 (two) times daily.     . [facility]S D3 5000 units capsule Take 5,000 Units by mouth daily.  3  . gabapentin (NEURONTIN) 300 MG capsule Take 600 mg by mouth 4 (four) times daily.     Marland Kitchen glipiZIDE (GLUCOTROL XL) 5 MG 24 hr tablet Take 5 mg by mouth daily with breakfast.    . levothyroxine (SYNTHROID, LEVOTHROID) 25 MCG tablet Take 25 mcg by mouth daily before breakfast.    . losartan (COZAAR) 50 MG tablet Take 50 mg by mouth daily.    . metFORMIN (GLUCOPHAGE) 500 MG tablet Take  1 tablet (500 mg total) by mouth daily with breakfast. (Patient taking differently: Take 1,000 mg by mouth 2 (two) times daily with a meal. ) 90 tablet 1  . metoprolol tartrate (LOPRESSOR) 50 MG tablet Take 50 mg by mouth daily.    . tamoxifen (NOLVADEX) 20 MG tablet Take 1 tablet (20 mg  total) by mouth daily. 90 tablet 3   No current facility-administered medications for this visit.     PHYSICAL EXAMINATION: ECOG PERFORMANCE STATUS: 1 - Symptomatic but completely ambulatory  Vitals:   05/01/18 1419  BP: (!) 152/89  Pulse: 79  Resp: 17  Temp: 98.5 F (36.9 C)  SpO2: 99%   Filed Weights   05/01/18 1419  Weight: 115 lb 6.4 oz (52.3 kg)    GENERAL:alert, no distress and comfortable SKIN: skin color, texture, turgor are normal, no rashes or significant lesions EYES: normal, Conjunctiva are pink and non-injected, sclera clear OROPHARYNX:no exudate, no erythema and lips, buccal mucosa, and tongue normal  NECK: supple, thyroid normal size, non-tender, without nodularity LYMPH:  no palpable lymphadenopathy in the cervical, axillary or inguinal LUNGS: clear to auscultation and percussion with normal breathing effort HEART: regular rate & rhythm and no murmurs and no lower extremity edema ABDOMEN:abdomen soft, non-tender and normal bowel sounds MUSCULOSKELETAL:no cyanosis of digits and no clubbing  NEURO: alert & oriented x 3 with fluent speech, no focal motor/sensory deficits EXTREMITIES: No lower extremity edema BREAST: No palpable masses or nodules in either right or left breasts. No palpable axillary supraclavicular or infraclavicular adenopathy no breast tenderness or nipple discharge. (exam performed in the presence of a chaperone)  LABORATORY DATA:  I have reviewed the data as listed CMP Latest Ref Rng & Units 03/09/2014 02/12/2014 12/23/2012  Glucose 70 - 99 mg/dL 161(H) 207(H) 141(H)  BUN 6 - 23 mg/dL 8 5(L) 5(L)  Creatinine 0.50 - 1.10 mg/dL 0.71 0.83 0.63  Sodium 135 - 145 mEq/L 138 136(L) 137  Potassium 3.5 - 5.3 mEq/L 4.1 5.0 4.0  Chloride 96 - 112 mEq/L 103 100 100  CO2 19 - 32 mEq/L _0 Calcium 8.4 - 10.5 mg/dL 8.7 8.9 8.8  Total Protein 6.0 - 8.3 g/dL 6.8 7.3 -  Total Bilirubin 0.2 - 1.2 mg/dL 1.9(H) 1.2 -  Alkaline Phos 39 - 117 U/L 137(H)  161(H) -  AST 0 - 37 U/L 64(H) 73(H) -  ALT 0 - 35 U/L 44(H) 49(H) -    Lab Results  Component Value Date   WBC 6.3 03/09/2014   HGB 14.8 03/09/2014   HCT 42.5 03/09/2014   MCV 94.0 03/09/2014   PLT 87 (L) 03/09/2014   NEUTROABS 2.7 03/09/2014    ASSESSMENT & PLAN:  Ductal carcinoma in situ (DCIS) of right breast 09/11/2017:Pleomorphic and branching linear calcifications right breast lateral aspect, stereotactic biopsy of right UOQ and right posterior UOQ revealed 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  She does have sweats at night which have been there before as well.  Plan: Mammogram 04/29/2018: Stable DCIS spanning 8 cm  Return to clinic in 6 months with another mammogram and follow-up    No orders of the defined types were placed in this encounter.  The patient has a good understanding of the overall plan. she agrees with it. she will call with any problems that may  develop before the next visit here.   Harriette Ohara, MD 05/01/18

## 2018-05-01 NOTE — Research (Signed)
COMET: 6 month follow-up I met with patient, here alone, in exam room after her vitals assessments. Patient in clinic to follow-up for 6 months on study. Patient's 6 month mammogram completed October 15th, 2019 and Dr. Lindi Adie reviewed results with her. Patient confirms to be taking her tamoxifen daily and denies having any issues or concerns or any new side effects that have started since the start of her tamoxifen. I inquired with patient if she had received the study 6 month questionnaire in the mail, and patient reports that she has not. I informed patient that she should be receiving those soon and asked her to complete as soon as she can and the place in the mail using the postage paid envelope provided. Patient gave verbal understanding. All patient's questions answered to her satisfaction.  Study Solicited adverse events: reviewed with patient and patient denies having anything new or changes from baseline.  Patient thanked for her time and continued support of study and was encouraged to call Dr. Lindi Adie or myself with any questions or concerns.  Patient with one year mammogram appointment on 4/21/202 and to return to clinic, November 06, 2018, for 12 month visit MD. Maxwell Marion, RN, BSN, [facility] Clinical Research 05/01/2018 3:26 PM  Martha Lozano [id number]  05/01/2018  Adverse Event Log  Study/Protocol: COMET Cycle: Baseline - 6 month  Event Grade Onset Date Resolved Date Drug Name Attribution Treatment Comments  Hypertension 2 Baseline ongoing N/A N/A conmed   Back pain 2 Baseline ongoing N/A N/A conmed H/O herniated disk  Diabetic 1 Baseline ongoing N/A N/A conmed H/O  Peripheral neuropathy-motor 2 Baseline ongoing N/A N/A conmed H/O

## 2018-05-01 NOTE — Telephone Encounter (Signed)
Gave avs and calendar ° °

## 2018-05-01 NOTE — Assessment & Plan Note (Signed)
09/11/2017:Pleomorphic and branching linear calcifications right breast lateral aspect, stereotactic biopsy of right UOQ and right posterior UOQ revealed 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  She does have sweats at night which have been there before as well.  Plan: Mammogram 04/29/2018: Stable DCIS spanning 8 cm  Return to clinic in 6 months with another mammogram and follow-up

## 2018-05-20 ENCOUNTER — Encounter: Payer: Self-pay | Admitting: Genetics

## 2018-05-20 NOTE — Progress Notes (Signed)
A new amended report was reported on 05/20/2018.   The previously identified variant of uncertain significance in MSH3 c.2041C>T (p.Pro681Ser) has been reclassified to 'Likely Benign'.

## 2018-06-06 ENCOUNTER — Telehealth: Payer: Self-pay | Admitting: Medical Oncology

## 2018-06-06 ENCOUNTER — Ambulatory Visit: Payer: Medicaid Other | Admitting: Hematology and Oncology

## 2018-06-06 NOTE — Telephone Encounter (Signed)
COMET Call to patient regarding 6 month questionnaire for study. Patient states she had received it, completed it and sent it back in the paid postage provided envelope. Patient thanked and encouraged to call with questions.  Rexene EdisonMirjana C. Consuella Scurlock, RN, BSN, [facility] Clinical Research 06/06/2018 3:26 PM

## 2018-09-26 ENCOUNTER — Encounter (HOSPITAL_COMMUNITY): Payer: Self-pay | Admitting: *Deleted

## 2018-09-26 ENCOUNTER — Emergency Department (HOSPITAL_COMMUNITY)
Admission: EM | Admit: 2018-09-26 | Discharge: 2018-09-26 | Disposition: A | Discharge status: Home / Self Care | Payer: Medicaid Other | Attending: Emergency Medicine | Admitting: Emergency Medicine

## 2018-09-26 ENCOUNTER — Other Ambulatory Visit: Payer: Self-pay

## 2018-09-26 DIAGNOSIS — Z853 Personal history of malignant neoplasm of breast: Secondary | ICD-10-CM | POA: Insufficient documentation

## 2018-09-26 DIAGNOSIS — Y9301 Activity, walking, marching and hiking: Secondary | ICD-10-CM | POA: Insufficient documentation

## 2018-09-26 DIAGNOSIS — Z7984 Long term (current) use of oral hypoglycemic drugs: Secondary | ICD-10-CM | POA: Insufficient documentation

## 2018-09-26 DIAGNOSIS — Y998 Other external cause status: Secondary | ICD-10-CM | POA: Insufficient documentation

## 2018-09-26 DIAGNOSIS — Y929 Unspecified place or not applicable: Secondary | ICD-10-CM | POA: Insufficient documentation

## 2018-09-26 DIAGNOSIS — Z79899 Other long term (current) drug therapy: Secondary | ICD-10-CM | POA: Diagnosis not present

## 2018-09-26 DIAGNOSIS — E119 Type 2 diabetes mellitus without complications: Secondary | ICD-10-CM | POA: Diagnosis not present

## 2018-09-26 DIAGNOSIS — F17210 Nicotine dependence, cigarettes, uncomplicated: Secondary | ICD-10-CM | POA: Diagnosis not present

## 2018-09-26 DIAGNOSIS — S3992XA Unspecified injury of lower back, initial encounter: Secondary | ICD-10-CM | POA: Diagnosis present

## 2018-09-26 DIAGNOSIS — S39012A Strain of muscle, fascia and tendon of lower back, initial encounter: Secondary | ICD-10-CM | POA: Diagnosis not present

## 2018-09-26 DIAGNOSIS — W010XXA Fall on same level from slipping, tripping and stumbling without subsequent striking against object, initial encounter: Secondary | ICD-10-CM | POA: Diagnosis not present

## 2018-09-26 HISTORY — DX: Sciatica, unspecified side: M54.30

## 2018-09-26 HISTORY — DX: Personal history of other infectious and parasitic diseases: Z86.19

## 2018-09-26 HISTORY — DX: Unspecified type of carcinoma in situ of right breast: D05.91

## 2018-09-26 MED ORDER — CYCLOBENZAPRINE HCL 10 MG PO TABS
10.0000 mg | ORAL_TABLET | Freq: Once | ORAL | Status: AC
  Administered 2018-09-26: 10 mg via ORAL
  Filled 2018-09-26: qty 1

## 2018-09-26 MED ORDER — TRAMADOL HCL 50 MG PO TABS: 50.0000 mg | ORAL_TABLET | Freq: Four times a day (QID) | ORAL | 0 refills | Status: DC | PRN

## 2018-09-26 MED ORDER — CYCLOBENZAPRINE HCL 10 MG PO TABS: 10.0000 mg | ORAL_TABLET | Freq: Three times a day (TID) | ORAL | 0 refills | Status: DC

## 2018-09-26 MED ORDER — HYDROCODONE-ACETAMINOPHEN 5-325 MG PO TABS
2.0000 | ORAL_TABLET | Freq: Once | ORAL | Status: AC
  Administered 2018-09-26: 2 via ORAL
  Filled 2018-09-26: qty 2

## 2018-09-26 NOTE — Discharge Instructions (Addendum)
Your vital signs within normal limits.  Your neurologic examination is within normal limits. Your examination suggest muscle strain of your lower back following a fall on your knees.  Heating pad to the area may be helpful.  Use Tylenol extra strength every 4 hours for mild pain.  Use Flexeril 3 times daily for spasm, may use Ultram for more severe pain.  Ultram and Flexeril may cause drowsiness.  Please do not drive a vehicle, operate machinery, or participate in activities requiring concentration when taking this medication.  Please see Martha Lozano for office follow-up next week.

## 2018-09-26 NOTE — ED Triage Notes (Signed)
Pt c/o fall today landing on her knees with pain to lower back. Pt reports she tripped over something prior to falling. Denies LOC. Denies pain to knees, only to back. Pt ambulatory in triage.

## 2018-09-26 NOTE — ED Provider Notes (Signed)
[facility] EMERGENCY DEPARTMENT Provider Note   CSN: [id number] Arrival date & time: 09/26/18  1001    History   Chief Complaint Chief Complaint  Patient presents with  . Fall    HPI Martha Lozano is a 58 y.o. female.     The history is provided by the patient.  Fall  This is a new problem. The current episode started 3 to 5 hours ago. The problem occurs constantly. The problem has been gradually worsening. Pertinent negatives include no chest pain, no abdominal pain and no shortness of breath. The symptoms are aggravated by standing (walking). Nothing relieves the symptoms. She has tried nothing for the symptoms.    Past Medical History:  Diagnosis Date  . Breast cancer, stage 0, right    pt currently taking Tamoxifen  . Diabetes mellitus without complication (HCC)   . Family history of breast cancer   . Family history of prostate cancer   . Family history of skin cancer   . Hep C w/o coma, chronic (HCC)   . Hx of hepatitis C    pt received treatment for Hep C in 2018  . Sciatic pain     Patient Active Problem List   Diagnosis Date Noted  . Genetic testing 11/08/2017  . Family history of prostate cancer   . Family history of breast cancer   . Family history of skin cancer   . 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 Polysubstance abuse (HCC) 03/02/2014  . Elevated BP 03/02/2014  . History of hepatitis C 03/02/2014  . Abnormal LFTs 03/02/2014  . Smoking 03/02/2014  . Thrombocytopenia, unspecified (HCC) 03/02/2014  . Paresthesia of both feet 03/02/2014  . Diabetes mellitus due to underlying condition without complications (HCC) 03/02/2014    Past Surgical History:  Procedure Laterality Date  . INCISION AND DRAINAGE ABSCESS Left    hand     OB History    Gravida  2   Para  1   Term  1   Preterm      AB  1   Living        SAB      TAB  1   Ectopic      Multiple      Live Births               Home  Medications    Prior to Admission medications   Medication Sig Start Date End Date Taking? Authorizing Provider  Buprenorphine HCl-Naloxone HCl (SUBOXONE) 12-3 MG FILM Place 1 Film under the tongue 2 (two) times daily.     [provider]  CVS D3 5000 units capsule Take 5,000 Units by mouth daily. 12/06/17   [provider]  gabapentin (NEURONTIN) 300 MG capsule Take 600 mg by mouth 4 (four) times daily.     [provider]  glipiZIDE (GLUCOTROL XL) 5 MG 24 hr tablet Take 5 mg by mouth daily with breakfast.    [provider]  levothyroxine (SYNTHROID, LEVOTHROID) 25 MCG tablet Take 25 mcg by mouth daily before breakfast.    [provider]  losartan (COZAAR) 50 MG tablet Take 50 mg by mouth daily.    [provider]  metFORMIN (GLUCOPHAGE) 500 MG tablet Take 1 tablet (500 mg total) by mouth daily with breakfast. Patient taking differently: Take 1,000 mg by mouth 2 (two) times daily with a meal.  03/02/14   Advani, Ayesha Rumpf, MD  metoprolol tartrate (LOPRESSOR) 50  MG tablet Take 50 mg by mouth daily.    [provider]  tamoxifen (NOLVADEX) 20 MG tablet Take 1 tablet (20 mg total) by mouth daily. 05/01/18   Serena CroissantGudena, Vinay, MD    Family History Family History  Problem Relation Age of Onset  . Diabetes Sister   . Hypertension Sister   . Breast cancer Sister [id number]  . Diabetes Brother   . Breast cancer Maternal Grandmother 50  . Cervical cancer Mother   . Prostate cancer Father [id number]       metastatic to bones  . Cancer Maternal Aunt        type unk dx <50  . Skin cancer Paternal Uncle   . Pulmonary embolism Maternal Grandfather 58  . Cancer Paternal Uncle   . Breast cancer Other   . Stomach cancer Other     Social History Social History   Tobacco Use  . Smoking status: Current Every Day Smoker    Packs/day: 0.33    Years: 35.00    Pack years: 11.55    Types: Cigarettes  . Smokeless tobacco: Never Used  Substance Use Topics   . Alcohol use: Yes    Comment: Occasional  . Drug use: Not Currently    Comment: history substance abuse      Allergies   Patient has no known allergies.   Review of Systems Review of Systems  Constitutional: Negative for activity change.       All ROS Neg except as noted in HPI  HENT: Negative for nosebleeds.   Eyes: Negative for photophobia and discharge.  Respiratory: Negative for cough, shortness of breath and wheezing.   Cardiovascular: Negative for chest pain and palpitations.  Gastrointestinal: Negative for abdominal pain and blood in stool.  Genitourinary: Negative for dysuria, frequency and hematuria.  Musculoskeletal: Positive for back pain. Negative for arthralgias and neck pain.  Skin: Negative.   Neurological: Negative for dizziness, seizures and speech difficulty.  Psychiatric/Behavioral: Negative for confusion and hallucinations.     Physical Exam Updated Vital Signs BP 139/84 (BP Location: Right Arm)   Pulse 70   Temp 98.2 F (36.8 C) (Oral)   Resp 16   Ht 5' (1.524 m)   Wt 51.7 kg   SpO2 99%   BMI 22.26 kg/m   Physical Exam Musculoskeletal:        General: Tenderness present.     Lumbar back: She exhibits pain and spasm.       Back:     Comments: No palpable step-off of the cervical, thoracic, or lumbar spine. No hot areas appreciated.  There is's tightness, tenseness, and spasm of the paraspinal area of the lumbar lower lumbar region.  Neurological:     Mental Status: She is alert and oriented to person, place, and time.     Cranial Nerves: Cranial nerves are intact.     Sensory: Sensation is intact.     Motor: Motor function is intact.     Coordination: Coordination normal.      ED Treatments / Results  Labs (all labs ordered are listed, but only abnormal results are displayed) Labs Reviewed - No data to display  EKG None  Radiology No results found.  Procedures Procedures (including critical care time)  Medications Ordered  in ED Medications - No data to display   Initial Impression / Assessment and Plan / ED Course  I have reviewed the triage vital signs and the nursing notes.  Pertinent labs & imaging results that  were available during my care of the patient were reviewed by me and considered in my medical decision making (see chart for details).          Final Clinical Impressions(s) / ED Diagnoses MDM  Vital signs within normal limits.  Pulse oximetry is 99% on room air.  Within normal limits by my interpretation.  There are no gross neurologic deficits appreciated on examination.  There is spasm noted along with pain of the lower back and the lower lumbar area.  There is no evidence for cauda equina or other emergent changes.  Patient will be treated with Ultram and Flexeril.  The patient is to follow-up with her primary physician for additional evaluation and management.   Final diagnoses:  Lumbar strain, initial encounter    ED Discharge Orders         Ordered    traMADol (ULTRAM) 50 MG tablet  Every 6 hours PRN     09/26/18 1220    cyclobenzaprine (FLEXERIL) 10 MG tablet  3 times daily     09/26/18 1220           Ivery Quale, PA-C 09/26/18 1224    Donnetta Hutching, MD 09/26/18 1447

## 2018-10-16 ENCOUNTER — Telehealth: Payer: Self-pay | Admitting: Medical Oncology

## 2018-10-16 NOTE — Telephone Encounter (Signed)
COMET Patient is on COMET study and scheduled for her 12 month bilateral mammogram and MD appointment.  I called patient to inquire about her mammogram appointment that was scheduled for 4/21 and was cancelled. Per patient, [facility] cancelled her appointment due to COVID-19 precautions. This was a diagnostic mammogram and patient is being followed every 6 months with mammograms and MD visits.I informed patient that I will confirm with MD if mammogram  should be rescheduled or not and will call her back. Patient denied any questions at this time, was thanked and encouraged to call in the meantime.  Rexene Edison, RN, BSN, [facility] Clinical Research 10/16/2018 1:54 PM  Reviewed with MD and per MD to reschedule mammogram and inform patient that her clinic visit with him will be a Webex virtual visit on the 23rd (at the already scheduled appt), due to COVID-19 precautions.  Call to [facility] ([facility] regarding appointment. Per [facility] appointment was cancelled, due to their office being closed on Tuesdays and Wednesdays, and was to be rescheduled with patient to return call. I rescheduled appointment with [facility] and called patient to inform her of new time, Monday, April 20th @ 10:20. Patient confirmed this appointment time worked for her and gave her understanding that visit with Dr. Pamelia Hoit will not be an in-clinic visit. All patient's questions answered at this time and she was encouraged to call.  Rexene Edison, RN, BSN, [facility] Clinical Research 10/16/2018 2:36 PM

## 2018-11-03 ENCOUNTER — Ambulatory Visit
Admission: RE | Admit: 2018-11-03 | Discharge: 2018-11-03 | Disposition: A | Discharge status: Home / Self Care | Payer: Medicaid Other | Source: Ambulatory Visit | Attending: Hematology and Oncology | Admitting: Hematology and Oncology

## 2018-11-03 ENCOUNTER — Other Ambulatory Visit: Payer: Self-pay

## 2018-11-03 DIAGNOSIS — D0511 Intraductal carcinoma in situ of right breast: Secondary | ICD-10-CM

## 2018-11-04 ENCOUNTER — Telehealth: Payer: Self-pay | Admitting: Hematology and Oncology

## 2018-11-04 NOTE — Telephone Encounter (Signed)
Spoke with patient regarding her appointment on 4/23. Patient expressed understanding and had nieces that set up the app for her. I sent the join link to her email.

## 2018-11-05 NOTE — Progress Notes (Signed)
HEMATOLOGY-ONCOLOGY [place] VISIT PROGRESS NOTE  I connected with Martha Lozano on 11/06/2018 at 10:15 AM EDT by Webex video conference and verified that I am speaking with the correct person using two identifiers.  I discussed the limitations, risks, security and privacy concerns of performing an evaluation and management service by Webex and the availability of in person appointments.  I also discussed with the patient that there may be a patient responsible charge related to this service. The patient expressed understanding and agreed to proceed.  Patient's Location: Home Physician Location: Clinic  CHIEF COMPLIANT: Follow-up of tamoxifen   INTERVAL HISTORY: Martha Lozano is a 58 y.o. 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 She is a participant in the Comet clinical trial and is on active surveillance. Her most recent mammogram from 11/03/18 showed stable DCIS in the right breast and no left breast malignancy. She presents to the clinic today for follow-up.  She denies any lumps or nodules in the breast.  She is also tolerating tamoxifen extremely well.    Ductal carcinoma in situ (DCIS) of right breast   09/11/2017 Initial Diagnosis    Pleomorphic and branching linear calcifications right breast lateral aspect, stereotactic biopsy of right UOQ and right posterior UOQ revealed intermediate grade DCIS ER 100%, PR 90%, Tis NX stage 0    10/29/2017 Miscellaneous    COMET clinical trial: Patient randomized to active surveillance    10/29/2017 -  Anti-estrogen oral therapy    Tamoxifen 20 mg daily    11/01/2017 Genetic Testing    The Common Hereditary Cancers Panel + Melanoma Panel + Basal Cell Nevus panel was ordered (53 genes). The following genes were evaluated for sequence changes and exonic deletions/duplications: APC, ATM, AXIN2, BAP1, BARD1, BMPR1A, BRCA1, BRCA2, BRIP1, CDH1, CDK4, CDKN2A (p14ARF), CDKN2A (p16INK4a), CHEK2, CTNNA1, DICER1, EPCAM*,  GREM1*, KIT, MEN1, MLH1, MSH2, MSH3, MSH6, MUTYH, NBN, NF1, PALB2, PDGFRA, PMS2, POLD1, POLE, POT1, PTCH1, PTEN, RAD50, RAD51C, RAD51D, RB1, SDHB, SDHC, SDHD, SMAD4, SMARCA4, STK11, SUFU, TP53, TSC1, TSC2, VHL. The following genes were evaluated for sequence changes only: HOXB13*, MITF*, NTHL1*, SDHA    Results: No pathogenic variants identified.  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  VUS's should not be used to make medical management decisions.  The date of this test report is 11/01/2017.      REVIEW OF SYSTEMS:   Constitutional: Denies fevers, chills or abnormal weight loss Eyes: Denies blurriness of vision Ears, nose, mouth, throat, and face: Denies mucositis or sore throat Respiratory: Denies cough, dyspnea or wheezes Cardiovascular: Denies palpitation, chest discomfort Gastrointestinal:  Denies nausea, heartburn or change in bowel habits Skin: Denies abnormal skin rashes Lymphatics: Denies new lymphadenopathy or easy bruising Neurological:Denies numbness, tingling or new weaknesses Behavioral/Psych: Mood is stable, no new changes  Extremities: No lower extremity edema Breast: denies any pain or lumps or nodules in either breasts All other systems were reviewed with the patient and are negative.  Observations/Objective:   CMP Latest Ref Rng & Units 03/09/2014 02/12/2014 12/23/2012  Glucose 70 - 99 mg/dL 161(H) 207(H) 141(H)  BUN 6 - 23 mg/dL 8 5(L) 5(L)  Creatinine 0.50 - 1.10 mg/dL 0.71 0.83 0.63  Sodium 135 - 145 mEq/L 138 136(L) 137  Potassium 3.5 - 5.3 mEq/L 4.1 5.0 4.0  Chloride 96 - 112 mEq/L 103 100 100  CO2 19 - 32 mEq/L 29 25 30   Calcium 8.4 - 10.5 mg/dL 8.7 8.9 8.8  Total Protein 6.0 - 8.3 g/dL 6.8 7.3 -  Total Bilirubin 0.2 - 1.2 mg/dL 1.9(H) 1.2 -  Alkaline Phos 39 - 117 U/L 137(H) 161(H) -  AST 0 - 37 U/L 64(H) 73(H) -  ALT 0 - 35 U/L 44(H) 49(H) -    Lab Results  Component Value Date   WBC 6.3  03/09/2014   HGB 14.8 03/09/2014   HCT 42.5 03/09/2014   MCV 94.0 03/09/2014   PLT 87 (L) 03/09/2014   NEUTROABS 2.7 03/09/2014      Assessment Plan:  Ductal carcinoma in situ (DCIS) of right breast 09/11/2017:Pleomorphic and branching linear calcifications right breast lateral aspect, stereotactic biopsy of right UOQ and right posterior UOQ revealed intermediate grade DCIS ER 100%, PR 90%, Tis NX stage 0  Current treatment:COMETclinical trial, active surveillance, tamoxifen 20 mg daily started 10/29/2017  Tamoxifen toxicities:Denies any side effects to tamoxifen. She does have sweats at night which have been there before as well.  Plan: Mammogram  11/03/2018: Stable DCIS spanning 8-9 cm  Return to clinicin 6 months with another mammogram and follow-up  Patient moved to eat and has not found a primary care physician yet.  She is running out of glipizide and Neurontin.  I sent a prescription for both of these as well.  I sent a new prescription for tamoxifen as well.  I discussed the assessment and treatment plan with the patient. The patient was provided an opportunity to ask questions and all were answered. The patient agreed with the plan and demonstrated an understanding of the instructions. The patient was advised to call back or seek an in-person evaluation if the symptoms worsen or if the condition fails to improve as anticipated.   I provided 15 minutes of face-to-face Web Ex time during this encounter.    Rulon Eisenmenger, MD 11/06/2018   I, Molly Dorshimer, am acting as scribe for Nicholas Lose, MD.  I have reviewed the above documentation for accuracy and completeness, and I agree with the above.

## 2018-11-06 ENCOUNTER — Inpatient Hospital Stay: Payer: Medicaid Other | Attending: Hematology and Oncology | Admitting: Hematology and Oncology

## 2018-11-06 ENCOUNTER — Telehealth: Payer: Self-pay | Admitting: Medical Oncology

## 2018-11-06 DIAGNOSIS — D0511 Intraductal carcinoma in situ of right breast: Secondary | ICD-10-CM | POA: Diagnosis not present

## 2018-11-06 DIAGNOSIS — Z79810 Long term (current) use of selective estrogen receptor modulators (SERMs): Secondary | ICD-10-CM | POA: Diagnosis not present

## 2018-11-06 DIAGNOSIS — Z006 Encounter for examination for normal comparison and control in clinical research program: Secondary | ICD-10-CM

## 2018-11-06 DIAGNOSIS — Z170 Estrogen receptor positive status [ER+]: Secondary | ICD-10-CM | POA: Diagnosis not present

## 2018-11-06 MED ORDER — TAMOXIFEN CITRATE 20 MG PO TABS: 20.0000 mg | ORAL_TABLET | Freq: Every day | ORAL | 3 refills | Status: DC

## 2018-11-06 MED ORDER — GABAPENTIN 400 MG PO CAPS: 400.0000 mg | ORAL_CAPSULE | Freq: Three times a day (TID) | ORAL | 3 refills | Status: DC

## 2018-11-06 MED ORDER — GLIPIZIDE ER 5 MG PO TB24: 5.0000 mg | ORAL_TABLET | Freq: Every day | ORAL | 3 refills | Status: DC

## 2018-11-06 NOTE — Telephone Encounter (Signed)
COMET: 12 month Follow-up LVMOM with patient regarding study and completing her follow-up visit via a phone assessment. Return contact information provided. Patient thanked.  Rexene Edison, RN, BSN, [facility] Clinical Research 11/06/2018 1:31 PM

## 2018-11-06 NOTE — Assessment & Plan Note (Signed)
09/11/2017:Pleomorphic and branching linear calcifications right breast lateral aspect, stereotactic biopsy of right UOQ and right posterior UOQ revealed intermediate grade DCIS ER 100%, PR 90%, Tis NX stage 0  Current treatment:COMETclinical trial, active surveillance, tamoxifen 20 mg daily started 10/29/2017  Tamoxifen toxicities:Denies any side effects to tamoxifen. She does have sweats at night which have been there before as well.  Plan:Mammogram  11/03/2018: Stable DCIS spanning 8-9 cm  Return to clinicin6 months with another mammogram and follow-up 

## 2018-11-07 ENCOUNTER — Encounter: Payer: Self-pay | Admitting: Medical Oncology

## 2018-11-07 DIAGNOSIS — D0511 Intraductal carcinoma in situ of right breast: Secondary | ICD-10-CM

## 2018-11-07 NOTE — Research (Signed)
COMET: 32-month follow-up I spoke with patient over the phone. Patient assessed via phone due to COVID-19 hospital policy in place. Patient had her mammogram earlier this week and Dr. Pamelia Hoit reviewed results with her via WebEx visit, yesterday. MD also addressed all patient's questions and concerns. I reviewed with patient any new and on going adverse events, as well as study solicited adverse events and these are documented below. Patient confirms to be taking her tamoxifen daily. Patient states she is doing well and has been having no issues other than an increase in hot flashes, and states that she forgot to mention this to Dr. Pamelia Hoit during the WebEx. Patient requesting a prescription to help with these. Patient also reports to be almost out of her blood pressure medication. She states that she is trying to find a new PCP since she  moved and is about to run out of her losartan and lopressor prescriptions. I informed her that I will send a message to Dr. Pamelia Hoit regarding her prescription requests. All patient's questions answered to her satisfaction. Patient was thanked for her continued support of study and was encouraged to call Dr. Pamelia Hoit or myself with questions or concerns.  Mammogram: Bilateral mammogram completed November 03, 2018. Questionnaires: Patient states she completed these and placed in mail for return, this past Monday the 20th.  Study Solicited adverse events: Reviewed with patient and patient reports increased "hot flashes." patient states hot flashes are accompanied with sweats and only occur during the day and most of day, per patient approximately "8 hours" out of the day. Patient denies other new adverse events or worsening of current ones.  Plan: Patient with next study follow-up in approximately 6 months and will have a mammogram scheduled as well an the 18 month follow-up MD visit. 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

## 2018-11-10 ENCOUNTER — Other Ambulatory Visit: Payer: Self-pay | Admitting: Hematology and Oncology

## 2018-11-10 ENCOUNTER — Telehealth: Payer: Self-pay | Admitting: Hematology and Oncology

## 2018-11-10 MED ORDER — LOSARTAN POTASSIUM 50 MG PO TABS: 50.0000 mg | ORAL_TABLET | Freq: Every day | ORAL | 0 refills | Status: DC

## 2018-11-10 MED ORDER — VENLAFAXINE HCL ER 37.5 MG PO CP24: 37.5000 mg | ORAL_CAPSULE | Freq: Every day | ORAL | 6 refills | Status: DC

## 2018-11-10 MED ORDER — METOPROLOL TARTRATE 50 MG PO TABS: 50.0000 mg | ORAL_TABLET | Freq: Every day | ORAL | 0 refills | Status: DC

## 2018-11-10 NOTE — Telephone Encounter (Signed)
Patient was complaining of severe hot flashes for 8 hours every day.  It is not improved even with the Neurontin.  I discussed with her about pros and cons of Effexor.  We will start her on Effexor 37.5 mg daily.  She is changing her primary care physicians and ran out of her blood pressure medicines.  I will send a 30-day supply of metoprolol and losartan.

## 2018-11-11 ENCOUNTER — Telehealth: Payer: Self-pay | Admitting: Medical Oncology

## 2018-11-11 ENCOUNTER — Other Ambulatory Visit: Payer: Self-pay | Admitting: Hematology and Oncology

## 2018-11-11 ENCOUNTER — Other Ambulatory Visit: Payer: Self-pay | Admitting: *Deleted

## 2018-11-11 NOTE — Telephone Encounter (Signed)
Return call to patient regarding her request for refill in prescriptions being sent to her past pharmacy. Patient confirmed with me that [facility] has transferred her prescriptions to her current pharmacy, [facility]. Patient states she was on the way to pick those up. Patient thanked me. She was encouraged to call with further questions or concerns.

## 2019-04-13 ENCOUNTER — Telehealth: Payer: Self-pay | Admitting: Medical Oncology

## 2019-04-13 NOTE — Telephone Encounter (Signed)
COMET LVMOM with patient regarding her 18 month follow-up mammogram and MD visit is due for study. Informed patient that she will need to have a mammogram scheduled and a visit for follow-up with Dr. Lindi Adie, after mammogram. I asked patient to return call at her earliest convenience so that we may schedule these appointments within the next few weeks. Patient thanked for her time and continued support of study.  Return contact information provided.  Maxwell Marion, RN, BSN, [facility] Clinical Research 04/13/2019 3:26 PM

## 2019-04-17 ENCOUNTER — Telehealth: Payer: Self-pay | Admitting: Medical Oncology

## 2019-04-17 NOTE — Telephone Encounter (Signed)
COMET: 26-month I spoke with patient this afternoon regarding scheduling her for 45-month on study visit. Informed patient that we need to schedule her for a mammogram. Patient and I agreed that I will schedule her for the mammogram appointment and call her with the appointment time, as well as for a follow-up visit with Dr. Lindi Adie, to follow after mammogram resulted. Patient gave verbal understanding. States any time in the morning between 9 and 11 works for her. I thanked patient for her time and informed her I will call her with appointment times, as soon as I have them scheduled. All patient's questions answered. Patient thanked and was encouraged to call me in the meantime.  Maxwell Marion, RN, BSN, [facility] Clinical Research 04/17/2019 3:23 PM

## 2019-04-20 ENCOUNTER — Other Ambulatory Visit: Payer: Self-pay | Admitting: Hematology and Oncology

## 2019-04-20 DIAGNOSIS — D0511 Intraductal carcinoma in situ of right breast: Secondary | ICD-10-CM

## 2019-04-21 ENCOUNTER — Telehealth: Payer: Self-pay | Admitting: Medical Oncology

## 2019-04-21 NOTE — Telephone Encounter (Signed)
COMET LVMOM with patient regarding her upcoming appointment for a unilateral mammogram. Patient was aware that I would be calling to schedule this appointment for her. Patient informed that mammogram scheduled for 10/21 @ 10:00 am with [facility], patient was also told to arrive 15-20 minutes to allow time for registration. Patient informed that appointment with Dr. Lindi Adie will be scheduled within the week for her follow-up appointment with study and for MD to review mammogram with her, scheduling to call with MD appointment. Patient thanked and to call with questions.  Maxwell Marion, RN, BSN, [facility] Clinical Research 04/21/2019 1:44 PM

## 2019-05-07 ENCOUNTER — Other Ambulatory Visit: Payer: Self-pay

## 2019-05-07 ENCOUNTER — Ambulatory Visit
Admission: RE | Admit: 2019-05-07 | Discharge: 2019-05-07 | Disposition: A | Discharge status: Home / Self Care | Payer: Medicaid Other | Source: Ambulatory Visit | Attending: Hematology and Oncology | Admitting: Hematology and Oncology

## 2019-05-07 DIAGNOSIS — D0511 Intraductal carcinoma in situ of right breast: Secondary | ICD-10-CM

## 2019-05-11 ENCOUNTER — Telehealth: Payer: Self-pay | Admitting: Medical Oncology

## 2019-05-11 NOTE — Telephone Encounter (Signed)
COMET: 18 month Patient LVMOM with me informing me that she received a reminder call regarding scheduled appt for MD tomorrow at 0945. Per patient she already has an appt., and has to cancel the 0945 appt.  Return call to patient asking her to clarify message and to let me know what her availability is so that I can reschedule this visit.  Patient thanked. Return number provided.  Maxwell Marion, RN, BSN, [facility] Clinical Research 05/11/2019 4:46 PM

## 2019-05-12 ENCOUNTER — Inpatient Hospital Stay: Payer: Medicaid Other | Attending: Hematology and Oncology | Admitting: Hematology and Oncology

## 2019-05-12 DIAGNOSIS — D0511 Intraductal carcinoma in situ of right breast: Secondary | ICD-10-CM | POA: Insufficient documentation

## 2019-05-12 NOTE — Assessment & Plan Note (Deleted)
09/11/2017:Pleomorphic and branching linear calcifications right breast lateral aspect, stereotactic biopsy of right UOQ and right posterior UOQ revealed intermediate grade DCIS ER 100%, PR 90%, Tis NX stage 0  Current treatment:COMETclinical trial, active surveillance, tamoxifen 20 mg daily started 10/29/2017  Tamoxifen toxicities:Denies any side effects to tamoxifen. She does have sweats at night which have been there before as well.  Plan:Mammogram   05/07/2019: Stable DCIS spanning 8.5 cm, breast density category C  Return to clinic in 6 months with mammogram and follow-up

## 2019-05-13 ENCOUNTER — Telehealth: Payer: Self-pay | Admitting: Medical Oncology

## 2019-05-13 NOTE — Telephone Encounter (Signed)
COMET Spoke with patient regarding rescheduling her missed study appointment with Dr. Lindi Adie. Confirmed with patient that November 4th @ 0900 works for her to come in for the month 18 visit. Patient thanked and encouraged to call with questions in the meantime.  Message sent to scheduling to place appt on MD's schedule.  Maxwell Marion, RN, BSN, [facility] Clinical Research 05/13/2019 12:47 PM

## 2019-05-20 ENCOUNTER — Inpatient Hospital Stay: Payer: Medicaid Other | Attending: Hematology and Oncology | Admitting: Hematology and Oncology

## 2019-05-20 ENCOUNTER — Telehealth: Payer: Self-pay | Admitting: Medical Oncology

## 2019-05-20 NOTE — Telephone Encounter (Signed)
COMET 0930 LVMOM with patient regarding her 0900 60-month missed appt today with Dr. Lindi Adie. Called patient to make sure she wasn't sitting in clinic lobby and waiting to be registered. Asked patient to return call.  10:00 AM LVMOM with patient regarding missed 0900 appointment this morning with MD. Asked patient to return call so that we can reschedule with MD. I reminded patient that this is a study visit and asked her to return call at earliest convenience so that we can reschedule appt soon. Patient thanked. Return call information provided.

## 2019-05-20 NOTE — Assessment & Plan Note (Deleted)
09/11/2017:Pleomorphic and branching linear calcifications right breast lateral aspect, stereotactic biopsy of right UOQ and right posterior UOQ revealed intermediate grade DCIS ER 100%, PR 90%, Tis NX stage 0  Current treatment:COMETclinical trial, active surveillance, tamoxifen 20 mg daily started 10/29/2017  Tamoxifen toxicities:Severe hot flashes for which she is currently on gabapentin and Effexor Mammogram   05/07/2019: Stable DCIS spanning 8.5 cm  Return to clinicin6 months with another mammogram and follow-up

## 2019-05-22 ENCOUNTER — Telehealth: Payer: Self-pay | Admitting: Medical Oncology

## 2019-05-22 NOTE — Telephone Encounter (Signed)
COMET Patient called informing me that she had received my VM Wednesday. Patient reporting she called and LVMOM with me Wednesday morning, and I had informed her that there were no messages for me. Patient apologized, states she has been having lots of difficulty with her phone working properly. Patient stated she was running a low-grade fever Wednesday morning and had called me to cancel MD appointment scheduled for that morning at 0930. Patient states that she was tested for COVID on Monday due to having some symptoms that could have possibly been COVID, and states she was concerned that the low-grade fever could have been COVID related. Patient reports she does not have the results from the test at the time of our conversation today. I thanked patient for calling me to inform me of her recent events. I informed patient that I will not reschedule her appointment with Dr. Lindi Adie until I hear back from her regarding her COVID test results. Patient gave verbal understanding. Patient thanked and encouraged to call back with questions/concerns and test results. Maxwell Marion, RN, BSN, [facility] Clinical Research 05/22/2019 1:02 PM

## 2019-05-26 ENCOUNTER — Telehealth: Payer: Self-pay | Admitting: Medical Oncology

## 2019-05-26 NOTE — Telephone Encounter (Signed)
COMET Follow-up call to patient regarding her statement of having a COVID-19 test done on 11/2 . Patient states that she does not have the results back from this test yet. Patient states she will follow-up with testing site and return call to inform me. Patient thanked. Maxwell Marion, RN, BSN, [facility] Clinical Research 05/26/2019 3:55 PM

## 2019-06-08 ENCOUNTER — Telehealth: Payer: Self-pay | Admitting: Hematology and Oncology

## 2019-06-08 NOTE — Telephone Encounter (Signed)
COMET Patient returned call regarding COVID results and placing an appointment for follow-up with MD and study visit. Per patient, she received a negative COVID result last week Tuesday, November 16th, patient reports a delay in call to me because she was waiting on another result from a family member that lives with them and wanted to make sure that everyone's results came back negative, in which patient states they did. Patient reports can come to clinic next week for the 80month study visit. We made tentative plans for patient to see Dr. Lindi Adie on December 2nd, pending MD's availability. Patient informed that scheduling will contact her should a December 2nd at 1:30 appt be unavailable, and to schedule another day that week, patient gave verbal understanding. Patient thanked and encouraged to call with questions.  Message sent to scheduling.  Maxwell Marion, RN, BSN, [facility] Clinical Research 06/08/2019 1:25 PM

## 2019-06-08 NOTE — Telephone Encounter (Signed)
Called pt per 11/23 sch message - unable to reach pt . Left message with apt date and time . No appts slot available for 12/2 per message

## 2019-06-15 ENCOUNTER — Telehealth: Payer: Self-pay | Admitting: Medical Oncology

## 2019-06-15 ENCOUNTER — Inpatient Hospital Stay: Payer: Medicaid Other | Admitting: Hematology and Oncology

## 2019-06-15 NOTE — Telephone Encounter (Signed)
COMET Patient called this morning informing me that she received a reminder call for her clinic appointment with Dr. Lindi Adie being today. Patient was under the impression that her appointment was this Wednesday, based on our last converstation. Patient is reliant on her sister for transportation and does not have transportation today due to short notice. Patient states she could come tomorrow. I informed her that I will try to get her in at [place] with Dr. Lindi Adie and that I will call her back with appointment update.  Patient thanked. Message sent to scheduling.  Maxwell Marion, RN, BSN, [facility] Clinical Research

## 2019-06-15 NOTE — Telephone Encounter (Signed)
COMET Patient informed of new time for tomorrow at 0945 with Dr. Lindi Adie for her 37-month visit. Patient confirms.

## 2019-06-15 NOTE — Progress Notes (Signed)
Patient Care Team: Neale Burly, MD as PCP - General (Internal Medicine) Maxwell Marion, RN as Registered Nurse (Medical Oncology)  DIAGNOSIS:    ICD-10-CM   1. Ductal carcinoma in situ (DCIS) of right breast  D05.11 MM DIAG BREAST TOMO BILATERAL    SUMMARY OF ONCOLOGIC HISTORY: Oncology History  Ductal carcinoma in situ (DCIS) of right breast  09/11/2017 Initial Diagnosis   Pleomorphic and branching linear calcifications right breast lateral aspect, stereotactic biopsy of right UOQ and right posterior UOQ revealed intermediate grade DCIS ER 100%, PR 90%, Tis NX stage 0   10/29/2017 Miscellaneous   COMET clinical trial: Patient randomized to active surveillance   10/29/2017 -  Anti-estrogen oral therapy   Tamoxifen 20 mg daily   11/01/2017 Genetic Testing   The Common Hereditary Cancers Panel + Melanoma Panel + Basal Cell Nevus panel was ordered (53 genes). The following genes were evaluated for sequence changes and exonic deletions/duplications: APC, ATM, AXIN2, BAP1, BARD1, BMPR1A, BRCA1, BRCA2, BRIP1, CDH1, CDK4, CDKN2A (p14ARF), CDKN2A (p16INK4a), CHEK2, CTNNA1, DICER1, EPCAM*, GREM1*, KIT, MEN1, MLH1, MSH2, MSH3, MSH6, MUTYH, NBN, NF1, PALB2, PDGFRA, PMS2, POLD1, POLE, POT1, PTCH1, PTEN, RAD50, RAD51C, RAD51D, RB1, SDHB, SDHC, SDHD, SMAD4, SMARCA4, STK11, SUFU, TP53, TSC1, TSC2, VHL. The following genes were evaluated for sequence changes only: HOXB13*, MITF*, NTHL1*, SDHA    Results: No pathogenic variants identified.  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  VUS's should not be used to make medical management decisions.  The date of this test report is 11/01/2017.      CHIEF COMPLIANT: Follow-up of right breast DCIS on neoadjuvant tamoxifen  INTERVAL HISTORY: Martha Lozano is a 58 y.o. with above-mentioned history of right breast DCIS who is currently on neoadjuvant tamoxifen. She is a participant in the Comet  clinical trial and is on active surveillance. Mammogram on 05/07/19 showed the known right breast DCIS was stable. She presents to the clinic today for follow-up.  She denies any pain or discomfort in the breast.  Denies lumps or nodules.  She is tolerating tamoxifen extremely well without any side effects.  She does have occasional aches and pains but nothing significant.  Hot flashes have improved significantly with Effexor.  REVIEW OF SYSTEMS:   Constitutional: Denies fevers, chills or abnormal weight loss Eyes: Denies blurriness of vision Ears, nose, mouth, throat, and face: Denies mucositis or sore throat Respiratory: Denies cough, dyspnea or wheezes Cardiovascular: Denies palpitation, chest discomfort Gastrointestinal: Denies nausea, heartburn or change in bowel habits Skin: Denies abnormal skin rashes Lymphatics: Denies new lymphadenopathy or easy bruising Neurological: Denies numbness, tingling or new weaknesses Behavioral/Psych: Mood is stable, no new changes  Extremities: No lower extremity edema Breast: denies any pain or lumps or nodules in either breasts All other systems were reviewed with the patient and are negative.  I have reviewed the past medical history, past surgical history, social history and family history with the patient and they are unchanged from previous note.  ALLERGIES:  has No Known Allergies.  MEDICATIONS:  Current Outpatient Medications  Medication Sig Dispense Refill  . Buprenorphine HCl-Naloxone HCl (SUBOXONE) 12-3 MG FILM Place 1 Film under the tongue 2 (two) times daily.     . CVS D3 5000 units capsule Take 5,000 Units by mouth daily.  3  . cyclobenzaprine (FLEXERIL) 10 MG tablet Take 1 tablet (10 mg total) by mouth 3 (three) times daily. 20 tablet 0  . gabapentin (NEURONTIN) 400  MG capsule Take 1 capsule (400 mg total) by mouth 3 (three) times daily. 270 capsule 3  . glipiZIDE (GLUCOTROL XL) 5 MG 24 hr tablet Take 1 tablet (5 mg total) by mouth  daily with breakfast. 90 tablet 3  . levothyroxine (SYNTHROID, LEVOTHROID) 25 MCG tablet Take 25 mcg by mouth daily before breakfast.    . losartan (COZAAR) 50 MG tablet Take 1 tablet (50 mg total) by mouth daily. 30 tablet 0  . metFORMIN (GLUCOPHAGE) 500 MG tablet Take 1 tablet (500 mg total) by mouth daily with breakfast. (Patient taking differently: Take 1,000 mg by mouth 2 (two) times daily with a meal. ) 90 tablet 1  . metoprolol tartrate (LOPRESSOR) 50 MG tablet TAKE 1 TABLET BY MOUTH EVERY DAY 90 tablet 0  . tamoxifen (NOLVADEX) 20 MG tablet Take 1 tablet (20 mg total) by mouth daily. 90 tablet 3  . traMADol (ULTRAM) 50 MG tablet Take 1 tablet (50 mg total) by mouth every 6 (six) hours as needed. 12 tablet 0  . venlafaxine XR (EFFEXOR-XR) 37.5 MG 24 hr capsule TAKE 1 CAPSULE (37.5 MG TOTAL) BY MOUTH DAILY WITH BREAKFAST. 90 capsule 0   No current facility-administered medications for this visit.     PHYSICAL EXAMINATION: ECOG PERFORMANCE STATUS: 1 - Symptomatic but completely ambulatory  Vitals:   06/16/19 1030  BP: 137/77  Pulse: 92  Resp: 17  Temp: (!) 97.4 F (36.3 C)  SpO2: 100%   Filed Weights   06/16/19 1030  Weight: 121 lb 6.4 oz (55.1 kg)    GENERAL: alert, no distress and comfortable SKIN: skin color, texture, turgor are normal, no rashes or significant lesions EYES: normal, Conjunctiva are pink and non-injected, sclera clear OROPHARYNX: no exudate, no erythema and lips, buccal mucosa, and tongue normal  NECK: supple, thyroid normal size, non-tender, without nodularity LYMPH: no palpable lymphadenopathy in the cervical, axillary or inguinal LUNGS: clear to auscultation and percussion with normal breathing effort HEART: regular rate & rhythm and no murmurs and no lower extremity edema ABDOMEN: abdomen soft, non-tender and normal bowel sounds MUSCULOSKELETAL: no cyanosis of digits and no clubbing  NEURO: alert & oriented x 3 with fluent speech, no focal  motor/sensory deficits EXTREMITIES: No lower extremity edema  LABORATORY DATA:  I have reviewed the data as listed CMP Latest Ref Rng & Units 03/09/2014 02/12/2014 12/23/2012  Glucose 70 - 99 mg/dL 161(H) 207(H) 141(H)  BUN 6 - 23 mg/dL 8 5(L) 5(L)  Creatinine 0.50 - 1.10 mg/dL 0.71 0.83 0.63  Sodium 135 - 145 mEq/L 138 136(L) 137  Potassium 3.5 - 5.3 mEq/L 4.1 5.0 4.0  Chloride 96 - 112 mEq/L 103 100 100  CO2 19 - 32 mEq/L 29 25 30   Calcium 8.4 - 10.5 mg/dL 8.7 8.9 8.8  Total Protein 6.0 - 8.3 g/dL 6.8 7.3 -  Total Bilirubin 0.2 - 1.2 mg/dL 1.9(H) 1.2 -  Alkaline Phos 39 - 117 U/L 137(H) 161(H) -  AST 0 - 37 U/L 64(H) 73(H) -  ALT 0 - 35 U/L 44(H) 49(H) -    Lab Results  Component Value Date   WBC 6.3 03/09/2014   HGB 14.8 03/09/2014   HCT 42.5 03/09/2014   MCV 94.0 03/09/2014   PLT 87 (L) 03/09/2014   NEUTROABS 2.7 03/09/2014    ASSESSMENT & PLAN:  Ductal carcinoma in situ (DCIS) of right breast 09/11/2017:Pleomorphic and branching linear calcifications right breast lateral aspect, stereotactic biopsy of right UOQ and right posterior UOQ revealed  intermediate grade DCIS ER 100%, PR 90%, Tis NX stage 0  Current treatment:COMETclinical trial, active surveillance, tamoxifen 20 mg daily started 10/29/2017  Tamoxifen toxicities:Denies any side effects to tamoxifen. She does have sweats at night which have been there before as well. Hot flashes: Significantly improved with Effexor  Plan:Mammogram  04/2019: Stable DCIS spanning 8.5 cm  Return to clinicin6 months with another mammogram and follow-up    Orders Placed This Encounter  Procedures  . MM DIAG BREAST TOMO BILATERAL    Standing Status:   Future    Standing Expiration Date:   06/15/2020    Order Specific Question:   Reason for Exam (SYMPTOM  OR DIAGNOSIS REQUIRED)    Answer:   Bilateral mammograms COMET Study    Comments:   Need clock face    Order Specific Question:   Is the patient pregnant?    Answer:    No    Order Specific Question:   Preferred imaging location?    Answer:   [facility]   The patient has a good understanding of the overall plan. she agrees with it. she will call with any problems that may develop before the next visit here.  Nicholas Lose, MD 06/16/2019  Julious Oka Dorshimer, am acting as scribe for Dr. Nicholas Lose.  I have reviewed the above documentation for accuracy and completeness, and I agree with the above.

## 2019-06-15 NOTE — Assessment & Plan Note (Deleted)
09/11/2017:Pleomorphic and branching linear calcifications right breast lateral aspect, stereotactic biopsy of right UOQ and right posterior UOQ revealed intermediate grade DCIS ER 100%, PR 90%, Tis NX stage 0  Current treatment:COMETclinical trial, active surveillance, tamoxifen 20 mg daily started 10/29/2017  Tamoxifen toxicities:Denies any side effects to tamoxifen. She does have sweats at night which have been there before as well.  Plan:Mammogram  11/03/2018: Stable DCIS spanning 8-9 cm  Return to clinicin6 months with another mammogram and follow-up 

## 2019-06-16 ENCOUNTER — Inpatient Hospital Stay: Payer: Medicaid Other | Attending: Hematology and Oncology | Admitting: Hematology and Oncology

## 2019-06-16 ENCOUNTER — Other Ambulatory Visit: Payer: Self-pay

## 2019-06-16 ENCOUNTER — Encounter: Payer: Self-pay | Admitting: Medical Oncology

## 2019-06-16 ENCOUNTER — Telehealth: Payer: Self-pay | Admitting: Hematology and Oncology

## 2019-06-16 DIAGNOSIS — D0511 Intraductal carcinoma in situ of right breast: Secondary | ICD-10-CM | POA: Diagnosis not present

## 2019-06-16 DIAGNOSIS — Z79899 Other long term (current) drug therapy: Secondary | ICD-10-CM | POA: Diagnosis not present

## 2019-06-16 DIAGNOSIS — R232 Flushing: Secondary | ICD-10-CM | POA: Diagnosis not present

## 2019-06-16 NOTE — Assessment & Plan Note (Signed)
09/11/2017:Pleomorphic and branching linear calcifications right breast lateral aspect, stereotactic biopsy of right UOQ and right posterior UOQ revealed intermediate grade DCIS ER 100%, PR 90%, Tis NX stage 0  Current treatment:COMETclinical trial, active surveillance, tamoxifen 20 mg daily started 10/29/2017  Tamoxifen toxicities:Denies any side effects to tamoxifen. She does have sweats at night which have been there before as well.  Plan:Mammogram  11/03/2018: Stable DCIS spanning 8-9 cm  Return to clinicin6 months with another mammogram and follow-up

## 2019-06-16 NOTE — Progress Notes (Signed)
COMET: Re-consent and 72-monthfollow-up  Re-Consent: I met with patient, here with a family member today, and patient and I reviewed all changes, page by page,  to the study consent form Version 5, dated 11/24/18. After Version 5 consent form review was completed and all of patient's questions answered to her satisfaction, patient proceeded to sign and date where indicated. Patient asked that her copy be mailed to her.  Patient's copy of Version 5 study consent will be placed in mail today to her home address.   Patient in clinic for her 18 month follow-up.  Patient had her mammogram in October and Dr. GLindi Adiereviewed results with her today. MD also addressed all patient's questions and concerns. I reviewed with patient any new and on going adverse events, as well as study solicited adverse events and these are documented below. Patient confirms to be taking her tamoxifen daily. Patient states she is doing well and has been having no issues. Patient reports having a new PCP, Dr. HSherrie Sport and is asking for her recent mammogram results to be sent to him. Dr. GGeralyn Flashnurse will forward these to her PCP. All patient's questions answered to her satisfaction. Patient was thanked for her continued support of study and was encouraged to call Dr. GLindi Adieor myself with questions or concerns.  Mammogram: Bilateral mammogram completed May 07, 2019. Questionnaires: None required at this time point. Study Solicited adverse events: Reviewed with patient and patient reports fewer to no "hot flashes."  Patient denies other new adverse events or worsening of current ones. Patient does report to have had a COVID test on November 2nd, she could not remember the name of the facility where she had the test completed. Patient reports that on November 16th she received a negative result from the COVID testing from the 2nd.  Plan: Patient with next study follow-up in approximately 6 months, April of 2021, and will have a  mammogram scheduled as well an the 24 month follow-up MD visit. Patient thanked and encouraged to call with questions or concerns.  MMaxwell Marion RN, BSN, [facility] 06/16/2019 11:16 AM    JJacqualine [id number] 11/07/2018  Adverse Event Log  Study/Protocol: COMET Cycle: 12 month - 18 month  Event Grade Onset Date Resolved Date Drug Name Attribution Treatment Comments  Hypertension 2 Baseline 06/16/19 N/A N/A conmed   Back pain 2 Baseline ongoing N/A N/A conmed H/O herniated disk  Diabetic 1 Baseline ongoing N/A N/A conmed H/O  Peripheral neuropathy-motor 2 Baseline ongoing N/A N/A conmed H/O  Hot Flashes 2 ~07/2018 ~12/2018 Tamoxifen Probable Effexor Rx.   Hypertension  1 06/16/19 ongoing N/A N/A  H/O  Hot Flashes: occasional  1 ~12/2018 ongoing  Tamoxifen Probable Effexor Rx

## 2019-06-16 NOTE — Telephone Encounter (Signed)
I talk with patient regarding schedule  

## 2019-10-27 ENCOUNTER — Telehealth: Payer: Self-pay | Admitting: Medical Oncology

## 2019-10-27 NOTE — Telephone Encounter (Signed)
COMET Attempt to contact patient several times and received busy signal, with no option to leave message. I contacted patient's sister, number listed in contact information. Informed Mrs. Elwyn Reach I was trying to contact patient for scheduling a mammogram for her 24 month on study mammogram, patient with Dr. Pamelia Hoit appointment on the 19th of this month. Mrs. Elwyn Reach informed me that the patient is currently incarcerated and she was not sure when patient will be released. She did inform me that patient had asked for her to contact me. Mrs. Elwyn Reach asked me to cancel MD appointment for the 19th. I suggested to Mrs. Elwyn Reach, per Dr. Pamelia Hoit, that if patient is willing and is able to, through the prison, to obtain a mammogram. Mrs. Elwyn Reach gave verbal understanding and stated she would mention this to the patient. I asked Mrs. Elwyn Reach, that patient call me when she is released. Mrs. Elwyn Reach thanked for her time and encouraged to call with questions.

## 2019-11-02 ENCOUNTER — Inpatient Hospital Stay: Payer: Medicaid Other | Admitting: Hematology and Oncology

## 2019-12-15 ENCOUNTER — Telehealth: Payer: Self-pay | Admitting: Hematology and Oncology

## 2019-12-15 NOTE — Telephone Encounter (Signed)
Faxed medical records [employer] at [phone], Release ID [id number]

## 2020-01-22 ENCOUNTER — Encounter: Payer: Self-pay | Admitting: Medical Oncology

## 2020-01-22 NOTE — Progress Notes (Signed)
COMET Patient has been withdrawn from study, July 8th, 2021, at the request of the sponsor.  Dr. Pamelia Hoit informed. Patient will be notified via certified e-mail.  Rexene Edison, RN, BSN, [facility] Clinical Research 01/22/2020 11:43 AM

## 2020-10-18 ENCOUNTER — Telehealth: Payer: Self-pay | Admitting: Medical Oncology

## 2020-10-18 NOTE — Telephone Encounter (Signed)
Incoming call: COMET Patient called to inform me that she has been released from prison and would like to get back on the COMET study. I informed her that I would need to check with the COMET study to see if this was possible since she was withdrawn, per study request. Patient voiced understanding. Patient also informed me that she continues to be on Tamoxifen and has enough to last her for about 45 days. I informed her that Dr. Pamelia Hoit would want to see her prior to refilling the prescription and for an asessment. Informed patient that I will send message to MD for an appointment and will return call to her as soon as I have more information regarding the study. Patient thanked me and I encouraged her to call with further questions in the meantime.  Rexene Edison, RN, BSN, [facility] Clinical Research 10/18/2020 2:02 PM

## 2020-10-21 ENCOUNTER — Telehealth: Payer: Self-pay | Admitting: Hematology and Oncology

## 2020-10-21 NOTE — Telephone Encounter (Signed)
Scheduled per sch msg. Called and left msg. Mailed printout  

## 2020-10-26 ENCOUNTER — Telehealth: Payer: Self-pay | Admitting: Medical Oncology

## 2020-10-26 NOTE — Telephone Encounter (Signed)
Incoming call: COMET Patient returned my call regarding needing more information. I informed patient that I have not received a response from study as of today, as to whether she would be able to return to study participation, patient gave her verbal understanding. Patient did confirm that she did not receive any RT while incarcerated and she did have a mammogram, but does not recall the month that it was completed. Patient states that she will obtain the result report. She confirms that she was told mammogram resulted WNL. I confirmed with patient her scheduled appointment with Dr. Pamelia Hoit May 3rd @ 0930.  Patient thanked for her return call and informed that I will call her as soon as I have more information from the study.  Rexene Edison, RN, BSN, [facility] Clinical Research 10/26/2020 12:55 PM

## 2020-11-14 ENCOUNTER — Ambulatory Visit: Payer: Medicaid Other | Admitting: Hematology and Oncology

## 2020-11-14 NOTE — Progress Notes (Signed)
Patient Care Team: Neale Burly, MD as PCP - General (Internal Medicine) Maxwell Marion, RN as Registered Nurse (Medical Oncology)  DIAGNOSIS:    ICD-10-CM   1. Ductal carcinoma in situ (DCIS) of right breast  D05.11 MM DIAG BREAST TOMO BILATERAL    SUMMARY OF ONCOLOGIC HISTORY: Oncology History  Ductal carcinoma in situ (DCIS) of right breast  09/11/2017 Initial Diagnosis   Pleomorphic and branching linear calcifications right breast lateral aspect, stereotactic biopsy of right UOQ and right posterior UOQ revealed intermediate grade DCIS ER 100%, PR 90%, Tis NX stage 0   10/29/2017 Miscellaneous   COMET clinical trial: Patient randomized to active surveillance   10/29/2017 -  Anti-estrogen oral therapy   Tamoxifen 20 mg daily   11/01/2017 Genetic Testing   The Common Hereditary Cancers Panel + Melanoma Panel + Basal Cell Nevus panel was ordered (53 genes). The following genes were evaluated for sequence changes and exonic deletions/duplications: APC, ATM, AXIN2, BAP1, BARD1, BMPR1A, BRCA1, BRCA2, BRIP1, CDH1, CDK4, CDKN2A (p14ARF), CDKN2A (p16INK4a), CHEK2, CTNNA1, DICER1, EPCAM*, GREM1*, KIT, MEN1, MLH1, MSH2, MSH3, MSH6, MUTYH, NBN, NF1, PALB2, PDGFRA, PMS2, POLD1, POLE, POT1, PTCH1, PTEN, RAD50, RAD51C, RAD51D, RB1, SDHB, SDHC, SDHD, SMAD4, SMARCA4, STK11, SUFU, TP53, TSC1, TSC2, VHL. The following genes were evaluated for sequence changes only: HOXB13*, MITF*, NTHL1*, SDHA    Results: No pathogenic variants identified.  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  VUS's should not be used to make medical management decisions.  The date of this test report is 11/01/2017.      CHIEF COMPLIANT: Follow-up ofright breast DCIS on neoadjuvanttamoxifen  INTERVAL HISTORY: Martha Lozano is a 60 y.o. with above-mentioned history of right breastDCIS who is currently onneoadjuvanttamoxifen. She was a participant in the  Comet clinical trial, however she was incarcerated in 2021 and was withdrawn from the study. I last saw her 1.5years ago.She presents to the clinic todayfor follow-up.  ALLERGIES:  has No Known Allergies.  MEDICATIONS:  Current Outpatient Medications  Medication Sig Dispense Refill  . amitriptyline (ELAVIL) 50 MG tablet Take 1 tablet (50 mg total) by mouth at bedtime.    Marland Kitchen atorvastatin (LIPITOR) 20 MG tablet Take 1 tablet (20 mg total) by mouth daily.    . Buprenorphine HCl-Naloxone HCl (SUBOXONE) 12-3 MG FILM Place 1 Film under the tongue 2 (two) times daily.     . CVS D3 5000 units capsule Take 5,000 Units by mouth daily.  3  . glipiZIDE (GLUCOTROL XL) 5 MG 24 hr tablet Take 1 tablet (5 mg total) by mouth daily with breakfast. 90 tablet 3  . levothyroxine (SYNTHROID) 100 MCG tablet Take 1 tablet (100 mcg total) by mouth daily before breakfast.    . losartan (COZAAR) 50 MG tablet Take 1 tablet (50 mg total) by mouth daily. 30 tablet 0  . metFORMIN (GLUCOPHAGE) 500 MG tablet Take 1 tablet (500 mg total) by mouth daily with breakfast. (Patient taking differently: Take 1,000 mg by mouth 2 (two) times daily with a meal. ) 90 tablet 1  . metoprolol tartrate (LOPRESSOR) 50 MG tablet TAKE 1 TABLET BY MOUTH EVERY DAY 90 tablet 0  . tamoxifen (NOLVADEX) 20 MG tablet Take 1 tablet (20 mg total) by mouth daily. 90 tablet 3   No current facility-administered medications for this visit.    PHYSICAL EXAMINATION: ECOG PERFORMANCE STATUS: 1 - Symptomatic but completely ambulatory  Vitals:   11/15/20 0931  BP: (!) 147/88  Pulse: (!) 122  Resp: 19  Temp: 97.6 F (36.4 C)  SpO2: 100%   Filed Weights   11/15/20 0931  Weight: 123 lb 4.8 oz (55.9 kg)    BREAST: No palpable masses or nodules in either right or left breasts. No palpable axillary supraclavicular or infraclavicular adenopathy no breast tenderness or nipple discharge. (exam performed in the presence of a chaperone)  LABORATORY DATA:   I have reviewed the data as listed CMP Latest Ref Rng & Units 03/09/2014 02/12/2014 12/23/2012  Glucose 70 - 99 mg/dL 161(H) 207(H) 141(H)  BUN 6 - 23 mg/dL 8 5(L) 5(L)  Creatinine 0.50 - 1.10 mg/dL 0.71 0.83 0.63  Sodium 135 - 145 mEq/L 138 136(L) 137  Potassium 3.5 - 5.3 mEq/L 4.1 5.0 4.0  Chloride 96 - 112 mEq/L 103 100 100  CO2 19 - 32 mEq/L $Remove'29 25 30  'NRaCqNy$ Calcium 8.4 - 10.5 mg/dL 8.7 8.9 8.8  Total Protein 6.0 - 8.3 g/dL 6.8 7.3 -  Total Bilirubin 0.2 - 1.2 mg/dL 1.9(H) 1.2 -  Alkaline Phos 39 - 117 U/L 137(H) 161(H) -  AST 0 - 37 U/L 64(H) 73(H) -  ALT 0 - 35 U/L 44(H) 49(H) -    Lab Results  Component Value Date   WBC 6.3 03/09/2014   HGB 14.8 03/09/2014   HCT 42.5 03/09/2014   MCV 94.0 03/09/2014   PLT 87 (L) 03/09/2014   NEUTROABS 2.7 03/09/2014    ASSESSMENT & PLAN:  Ductal carcinoma in situ (DCIS) of right breast 09/11/2017:Pleomorphic and branching linear calcifications right breast lateral aspect, stereotactic biopsy of right UOQ and right posterior UOQ revealed intermediate grade DCIS ER 100%, PR 90%, Tis NX stage 0  Current treatment:She was on COMETclinical trial, active surveillance, tamoxifen 20 mg daily started 10/29/2017, when she got incarcerated we had to remove her from the trial.  However patient wishes to follow active surveillance of the trial.  Tamoxifen toxicities:Denies any side effects to tamoxifen. She does have sweats at night which have been there before as well. She continued the tamoxifen even while she was incarcerated.  Chronic pain issues: Follows with a pain management physician.  Plan:Mammogram10/22/2020: Stable DCIS We will plan for mammograms to be done every 6 months on the right breast and bilaterally annually.  Return to clinicin6 months with another mammogram and follow-up    Orders Placed This Encounter  Procedures  . MM DIAG BREAST TOMO BILATERAL    Standing Status:   Future    Standing Expiration Date:   11/15/2021     Order Specific Question:   Reason for Exam (SYMPTOM  OR DIAGNOSIS REQUIRED)    Answer:   DCIS unresected    Order Specific Question:   Is the patient pregnant?    Answer:   No    Order Specific Question:   Preferred imaging location?    Answer:   [facility]    Order Specific Question:   Release to patient    Answer:   Immediate   The patient has a good understanding of the overall plan. she agrees with it. she will call with any problems that may develop before the next visit here.  Total time spent: 30 mins including face to face time and time spent for planning, charting and coordination of care  Rulon Eisenmenger, MD, MPH 11/15/2020  I, Cloyde Reams Dorshimer, am acting as scribe for Dr. Nicholas Lose.  I have reviewed the above documentation for accuracy and completeness, and I  agree with the above.

## 2020-11-14 NOTE — Assessment & Plan Note (Signed)
Ductal carcinoma in situ (DCIS) of right breast 09/11/2017:Pleomorphic and branching linear calcifications right breast lateral aspect, stereotactic biopsy of right UOQ and right posterior UOQ revealed intermediate grade DCIS ER 100%, PR 90%, Tis NX stage 0  Current treatment:COMETclinical trial, active surveillance, tamoxifen 20 mg daily started 10/29/2017  Tamoxifen toxicities:Denies any side effects to tamoxifen. She does have sweats at night which have been there before as well. Hot flashes: Significantly improved with Effexor  Plan:Mammogram10/22/2020: Stable DCIS  Return to clinicin6 months with another mammogram and follow-up

## 2020-11-15 ENCOUNTER — Inpatient Hospital Stay: Payer: Medicaid Other | Attending: Hematology and Oncology | Admitting: Hematology and Oncology

## 2020-11-15 ENCOUNTER — Other Ambulatory Visit: Payer: Self-pay

## 2020-11-15 ENCOUNTER — Encounter: Payer: Self-pay | Admitting: Medical Oncology

## 2020-11-15 DIAGNOSIS — D0511 Intraductal carcinoma in situ of right breast: Secondary | ICD-10-CM | POA: Insufficient documentation

## 2020-11-15 DIAGNOSIS — Z79899 Other long term (current) drug therapy: Secondary | ICD-10-CM | POA: Insufficient documentation

## 2020-11-15 DIAGNOSIS — G8929 Other chronic pain: Secondary | ICD-10-CM | POA: Diagnosis not present

## 2020-11-15 MED ORDER — ATORVASTATIN CALCIUM 20 MG PO TABS: 20.0000 mg | ORAL_TABLET | Freq: Every day | ORAL | Status: DC

## 2020-11-15 MED ORDER — TAMOXIFEN CITRATE 20 MG PO TABS: 20.0000 mg | ORAL_TABLET | Freq: Every day | ORAL | 3 refills | Status: DC

## 2020-11-15 MED ORDER — LEVOTHYROXINE SODIUM 100 MCG PO TABS: 100.0000 ug | ORAL_TABLET | Freq: Every day | ORAL | Status: DC

## 2020-11-15 MED ORDER — AMITRIPTYLINE HCL 50 MG PO TABS: 50.0000 mg | ORAL_TABLET | Freq: Every day | ORAL | Status: DC

## 2020-11-15 NOTE — Progress Notes (Signed)
COMET: COMPARING AN OPERATION TO MONITORING, WITH OR WITHOUT ENDOCRINE THERAPY (COMET)  FOR LOW RISK DCIS: A PHASE III PROSPECTIVE RANDOMIZED TRIAL  Patient in clinic today for a follow-up with Dr. Lindi Adie. I met with patient, who is here alone, in the exam room briefly and informed her that we will not be able to re-enroll her to the study since it has been well over a year since her last study assessment. Patient gave her verbal understanding to this information. Patient informed that she and Dr. Lindi Adie will discuss today any treatment plans going forward. I thanked patient for her time and contribution to this study and encouraged her to call with any questions or concerns she may have.  Maxwell Marion, RN, BSN, [facility] Clinical Research 11/15/2020 10:35 AM

## 2020-11-17 ENCOUNTER — Other Ambulatory Visit: Payer: Self-pay | Admitting: *Deleted

## 2020-11-17 MED ORDER — TAMOXIFEN CITRATE 20 MG PO TABS: 20.0000 mg | ORAL_TABLET | Freq: Every day | ORAL | 3 refills | Status: DC

## 2020-11-23 ENCOUNTER — Encounter (HOSPITAL_COMMUNITY): Payer: Self-pay

## 2020-11-23 ENCOUNTER — Emergency Department (HOSPITAL_COMMUNITY)
Admission: EM | Admit: 2020-11-23 | Discharge: 2020-11-23 | Disposition: A | Discharge status: Home / Self Care | Payer: Medicaid Other | Attending: Emergency Medicine | Admitting: Emergency Medicine

## 2020-11-23 ENCOUNTER — Emergency Department (HOSPITAL_COMMUNITY): Payer: Medicaid Other

## 2020-11-23 ENCOUNTER — Other Ambulatory Visit: Payer: Self-pay

## 2020-11-23 DIAGNOSIS — F17210 Nicotine dependence, cigarettes, uncomplicated: Secondary | ICD-10-CM | POA: Insufficient documentation

## 2020-11-23 DIAGNOSIS — S90851A Superficial foreign body, right foot, initial encounter: Secondary | ICD-10-CM | POA: Diagnosis not present

## 2020-11-23 DIAGNOSIS — Z853 Personal history of malignant neoplasm of breast: Secondary | ICD-10-CM | POA: Insufficient documentation

## 2020-11-23 DIAGNOSIS — X58XXXA Exposure to other specified factors, initial encounter: Secondary | ICD-10-CM | POA: Insufficient documentation

## 2020-11-23 DIAGNOSIS — E1140 Type 2 diabetes mellitus with diabetic neuropathy, unspecified: Secondary | ICD-10-CM | POA: Insufficient documentation

## 2020-11-23 DIAGNOSIS — Z7984 Long term (current) use of oral hypoglycemic drugs: Secondary | ICD-10-CM | POA: Diagnosis not present

## 2020-11-23 DIAGNOSIS — S99921A Unspecified injury of right foot, initial encounter: Secondary | ICD-10-CM | POA: Diagnosis present

## 2020-11-23 LAB — COMPREHENSIVE METABOLIC PANEL
ALT: 17 U/L (ref 0–44)
AST: 21 U/L (ref 15–41)
Albumin: 3.3 g/dL — ABNORMAL LOW (ref 3.5–5.0)
Alkaline Phosphatase: 55 U/L (ref 38–126)
Anion gap: 7 (ref 5–15)
BUN: 9 mg/dL (ref 6–20)
CO2: 28 mmol/L (ref 22–32)
Calcium: 8.9 mg/dL (ref 8.9–10.3)
Chloride: 99 mmol/L (ref 98–111)
Creatinine, Ser: 0.78 mg/dL (ref 0.44–1.00)
GFR, Estimated: >60 mL/min (ref >60)
Glucose, Bld: 287 mg/dL — ABNORMAL HIGH (ref 70–99)
Potassium: 4.4 mmol/L (ref 3.5–5.1)
Sodium: 134 mmol/L — ABNORMAL LOW (ref 135–145)
Total Bilirubin: 0.6 mg/dL (ref 0.3–1.2)
Total Protein: 6.7 g/dL (ref 6.5–8.1)

## 2020-11-23 LAB — CBG MONITORING, ED: Glucose-Capillary: 294 mg/dL — ABNORMAL HIGH (ref 70–99)

## 2020-11-23 LAB — CBC
HCT: 39.9 % (ref 36.0–46.0)
Hemoglobin: 12.5 g/dL (ref 12.0–15.0)
MCH: 28.9 pg (ref 26.0–34.0)
MCHC: 31.3 g/dL (ref 30.0–36.0)
MCV: 92.1 fL (ref 80.0–100.0)
Platelets: 206 10*3/uL (ref 150–400)
RBC: 4.33 MIL/uL (ref 3.87–5.11)
RDW: 11.9 % (ref 11.5–15.5)
WBC: 8 10*3/uL (ref 4.0–10.5)
nRBC: 0 % (ref 0.0–0.2)

## 2020-11-23 MED ORDER — LIDOCAINE HCL (PF) 1 % IJ SOLN
20.0000 mL | Freq: Once | INTRAMUSCULAR | Status: AC
  Administered 2020-11-23: 20 mL
  Filled 2020-11-23: qty 20

## 2020-11-23 MED ORDER — OXYCODONE-ACETAMINOPHEN 5-325 MG PO TABS
1.0000 | ORAL_TABLET | Freq: Once | ORAL | Status: AC
  Administered 2020-11-23: 1 via ORAL
  Filled 2020-11-23: qty 1

## 2020-11-23 MED ORDER — DOXYCYCLINE HYCLATE 100 MG PO CAPS: 100.0000 mg | ORAL_CAPSULE | Freq: Two times a day (BID) | ORAL | 0 refills | Status: AC

## 2020-11-23 MED ORDER — DOXYCYCLINE HYCLATE 100 MG PO TABS
100.0000 mg | ORAL_TABLET | Freq: Once | ORAL | Status: AC
  Administered 2020-11-23: 100 mg via ORAL
  Filled 2020-11-23: qty 1

## 2020-11-23 MED ORDER — LIDOCAINE-EPINEPHRINE-TETRACAINE (LET) TOPICAL GEL
3.0000 mL | Freq: Once | TOPICAL | Status: AC
  Administered 2020-11-23: 3 mL via TOPICAL
  Filled 2020-11-23: qty 3

## 2020-11-23 NOTE — Progress Notes (Signed)
Orthopedic Tech Progress Note Patient Details:  Martha Lozano August 09, 1960 [id number]  Ortho Devices Type of Ortho Device: Postop shoe/boot Ortho Device/Splint Location: RLE Ortho Device/Splint Interventions: Ordered,Application,Adjustment   Post Interventions Patient Tolerated: Well Instructions Provided: Care of device   Donald Pore 11/23/2020, 1:05 PM

## 2020-11-23 NOTE — ED Provider Notes (Signed)
[facility] EMERGENCY DEPARTMENT Provider Note   CSN: [id number] Arrival date & time: 11/23/20  0850     History Chief Complaint  Patient presents with  . Foot Pain    Martha Lozano is a 60 y.o. female with past medical history significant for breast cancer, type 2 diabetes, hep C, sciatic pain.  HPI Patient presents to emergency room today with chief complaint of progressively worsening right foot pain x2 weeks that she describes as constant aching and burning. Pain is worse with ambulation.  Patient states when she was in prison x2 months ago she noticed a small black dot on the bottom of her right foot. It did not bother her at that time, was incidental finding. She noticed a small wound vs blister developing when the pain started. It has progressively been growing in size. She went to pcp x 3 days ago and was given an antibiotic shot that she does not remember the name of and prescribed keflex, has take 2 days of it so far. She reports last night the wound open up and had pus draining from it. She rates pain 10/10 in severity. She sees pain management, takes suboxone daily which she does not feeling like it is touching this foot pain. Her blood sugars are usually well controlled in the 120s, last night it was 214. She denies any fever, chills, numbness, or tingling.  Patient does admit she broke her right second toe approximately 18 months ago however has diabetic neuropathy and did not feel it after it was broken so did not seek treatment for it.     Past Medical History:  Diagnosis Date  . Breast cancer, stage 0, right    pt currently taking Tamoxifen  . Diabetes mellitus without complication (HCC)   . Family history of breast cancer   . Family history of prostate cancer   . Family history of skin cancer   . Hep C w/o coma, chronic (HCC)   . Hx of hepatitis C    pt received treatment for Hep C in 2018  . Sciatic pain     Patient Active Problem List    Diagnosis Date Noted  . Genetic testing 11/08/2017  . Family history of prostate cancer   . Family history of breast cancer   . Family history of skin cancer   . 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 Polysubstance abuse (HCC) 03/02/2014  . Elevated BP 03/02/2014  . History of hepatitis C 03/02/2014  . Abnormal LFTs 03/02/2014  . Smoking 03/02/2014  . Thrombocytopenia, unspecified (HCC) 03/02/2014  . Paresthesia of both feet 03/02/2014  . Diabetes mellitus due to underlying condition without complications (HCC) 03/02/2014    Past Surgical History:  Procedure Laterality Date  . BREAST BIOPSY Right 09/12/2017  . INCISION AND DRAINAGE ABSCESS Left    hand     OB History    Gravida  2   Para  1   Term  1   Preterm      AB  1   Living        SAB      IAB  1   Ectopic      Multiple      Live Births              Family History  Problem Relation Age of Onset  . Diabetes Sister   . Hypertension Sister   . Breast cancer Sister 64  .  Diabetes Brother   . Breast cancer Maternal Grandmother 50  . Cervical cancer Mother   . Prostate cancer Father 44       metastatic to bones  . Cancer Maternal Aunt        type unk dx <50  . Skin cancer Paternal Uncle   . Pulmonary embolism Maternal Grandfather 58  . Cancer Paternal Uncle   . Breast cancer Other   . Stomach cancer Other     Social History   Tobacco Use  . Smoking status: Current Every Day Smoker    Packs/day: 0.33    Years: 35.00    Pack years: 11.55    Types: Cigarettes  . Smokeless tobacco: Never Used  Substance Use Topics  . Alcohol use: Yes    Comment: Occasional  . Drug use: Not Currently    Comment: history substance abuse     Home Medications Prior to Admission medications   Medication Sig Start Date End Date Taking? Authorizing Provider  doxycycline (VIBRAMYCIN) 100 MG capsule Take 1 capsule (100 mg total) by mouth 2 (two) times daily for 7 days. 11/23/20 11/30/20  Yes Walisiewicz, Kenyia Wambolt E, PA-C  amitriptyline (ELAVIL) 50 MG tablet Take 1 tablet (50 mg total) by mouth at bedtime. 11/15/20   Serena Croissant, MD  atorvastatin (LIPITOR) 20 MG tablet Take 1 tablet (20 mg total) by mouth daily. 11/15/20   Serena Croissant, MD  Buprenorphine HCl-Naloxone HCl (SUBOXONE) 12-3 MG FILM Place 1 Film under the tongue 2 (two) times daily.     [provider]  [facility] D3 5000 units capsule Take 5,000 Units by mouth daily. 12/06/17   [provider]  glipiZIDE (GLUCOTROL XL) 5 MG 24 hr tablet Take 1 tablet (5 mg total) by mouth daily with breakfast. 11/06/18   Serena Croissant, MD  levothyroxine (SYNTHROID) 100 MCG tablet Take 1 tablet (100 mcg total) by mouth daily before breakfast. 11/15/20   Serena Croissant, MD  losartan (COZAAR) 50 MG tablet Take 1 tablet (50 mg total) by mouth daily. 11/10/18   Serena Croissant, MD  metFORMIN (GLUCOPHAGE) 500 MG tablet Take 1 tablet (500 mg total) by mouth daily with breakfast. Patient taking differently: Take 1,000 mg by mouth 2 (two) times daily with a meal.  03/02/14   Advani, Deepak, MD  metoprolol tartrate (LOPRESSOR) 50 MG tablet TAKE 1 TABLET BY MOUTH EVERY DAY 11/11/18   Serena Croissant, MD  tamoxifen (NOLVADEX) 20 MG tablet Take 1 tablet (20 mg total) by mouth daily. 11/17/20   Magrinat, Valentino Hue, MD    Allergies    Patient has no known allergies.  Review of Systems   Review of Systems All other systems are reviewed and are negative for acute change except as noted in the HPI.  Physical Exam Updated Vital Signs BP (!) 159/89 (BP Location: Right Arm)   Pulse (!) 116   Temp 97.6 F (36.4 C) (Oral)   Resp 18   SpO2 100%   Physical Exam Vitals and nursing note reviewed.  Constitutional:      General: She is not in acute distress.    Appearance: She is not ill-appearing.  HENT:     Head: Normocephalic and atraumatic.     Right Ear: Tympanic membrane and external ear normal.     Left Ear: Tympanic membrane and external ear  normal.     Nose: Nose normal.     Mouth/Throat:     Mouth: Mucous membranes are moist.  Pharynx: Oropharynx is clear.  Eyes:     General: No scleral icterus.       Right eye: No discharge.        Left eye: No discharge.     Extraocular Movements: Extraocular movements intact.     Conjunctiva/sclera: Conjunctivae normal.     Pupils: Pupils are equal, round, and reactive to light.  Neck:     Vascular: No JVD.  Cardiovascular:     Rate and Rhythm: Regular rhythm. Tachycardia present.     Pulses: Normal pulses.          Radial pulses are 2+ on the right side and 2+ on the left side.     Heart sounds: Normal heart sounds.  Pulmonary:     Comments: Lungs clear to auscultation in all fields. Symmetric chest rise. No wheezing, rales, or rhonchi. Abdominal:     Comments: Abdomen is soft, non-distended, and non-tender in all quadrants. No rigidity, no guarding. No peritoneal signs.  Musculoskeletal:        General: Normal range of motion.     Cervical back: Normal range of motion.       Feet:  Feet:     Comments: Please see media below.  Dorsum of right foot is erythematous. Warm to the touch and tender to palpation as depicted in image above. Skin:    General: Skin is warm and dry.     Capillary Refill: Capillary refill takes less than 2 seconds.  Neurological:     Mental Status: She is oriented to person, place, and time.     GCS: GCS eye subscore is 4. GCS verbal subscore is 5. GCS motor subscore is 6.     Comments: Fluent speech, no facial droop.  Psychiatric:        Behavior: Behavior normal.          ED Results / Procedures / Treatments   Labs (all labs ordered are listed, but only abnormal results are displayed) Labs Reviewed  COMPREHENSIVE METABOLIC PANEL - Abnormal; Notable for the following components:      Result Value   Sodium 134 (*)    Glucose, Bld 287 (*)    Albumin 3.3 (*)    All other components within normal limits  CBG MONITORING, ED - Abnormal;  Notable for the following components:   Glucose-Capillary 294 (*)    All other components within normal limits  AEROBIC CULTURE W GRAM STAIN (SUPERFICIAL SPECIMEN)  CBC    EKG None  Radiology DG Foot Complete Right  Result Date: 11/23/2020 CLINICAL DATA:  Diabetic ulcer EXAM: RIGHT FOOT COMPLETE - 3+ VIEW COMPARISON:  None. FINDINGS: Deformity of the proximal phalanx of the second toe favored to be subacute to chronic fracture. Osteomyelitis considered less likely but difficult to completely exclude. Minimal spurring of the first metatarsophalangeal joint. Possible foreign body noted in the mid plantar soft tissues. Please correlate with physical exam. IMPRESSION: 1. Deformity of the head of the proximal phalanx of the second toe favored to be subacute to chronic fracture, however osteomyelitis difficult to completely exclude. Please correlate for focal tenderness. 2. Possible linear foreign body of the plantar midfoot. Please correlate with physical exam. Electronically Signed   By: Acquanetta BellingFarhaan  Mir M.D.   On: 11/23/2020 10:41    Procedures .Foreign Body Removal  Date/Time: 11/23/2020 12:18 PM Performed by: Shanon AceWalisiewicz, Codey Burling E, PA-C Authorized by: Shanon AceWalisiewicz, Carlota Philley E, PA-C  Consent: Verbal consent obtained. Risks and benefits: risks, benefits and alternatives were discussed  Consent given by: patient Patient identity confirmed: verbally with patient Body area: skin General location: lower extremity Location details: right foot  Anesthesia: Local Anesthetic: LET (lido,epi,tetracaine) Complexity: simple 1 objects recovered. Objects recovered: shard of glass that was embedded in foot Post-procedure assessment: foreign body removed Patient tolerance: patient tolerated the procedure well with no immediate complications     Medications Ordered in ED Medications  doxycycline (VIBRA-TABS) tablet 100 mg (has no administration in time range)  oxyCODONE-acetaminophen  (PERCOCET/ROXICET) 5-325 MG per tablet 1 tablet (1 tablet Oral Given 11/23/20 1030)  lidocaine (PF) (XYLOCAINE) 1 % injection 20 mL (20 mLs Infiltration Given 11/23/20 1133)  lidocaine-EPINEPHrine-tetracaine (LET) topical gel (3 mLs Topical Given 11/23/20 1134)    ED Course  I have reviewed the triage vital signs and the nursing notes.  Pertinent labs & imaging results that were available during my care of the patient were reviewed by me and considered in my medical decision making (see chart for details).    MDM Rules/Calculators/A&P                          History provided by patient with additional history obtained from chart review.    Patient is afebrile and tachycardic to 116 on ED arrival.  Patient is nontoxic-appearing.  She has a erythematous right foot and wound on plantar aspect of right foot with purulent drainage.  Sensation is intact to right foot. Concern for diabetic foot ulcer.  Wound culture collected. Percocet given for pain.  CBC unremarkable. CMP without significant electrolyte derangement, no renal insufficiency.  Glucose is elevated at 287.  Patient drinking water here.  She would rather treat her hyperglycemia when she gets home.  I viewed x-ray of foot which shows shows linear foreign body where her wound is.  I was able to remove a shard of glass from her foot.  Please see procedure note above.  Radiologist also commented on deformity of head of proximal phalanx of the second toe favored to be subacute to chronic fracture and osteomyelitis could not be ruled out.  On exam patient has no tenderness to that toe.  Consulted on-call orthopedist Earney Hamburg, PA-C who recommends outpatient follow-up with Dr. Lajoyce Corners.  No interventions needed at this time.  Vital signs were rechecked and tachycardia has resolved.  Suspect it was related to pain on arrival.  Will have patient discontinue Keflex and take doxycycline to give MRSA coverage for her wound.  Discussed home wound care  and close follow-up with PCP for wound check in 2 days.  Strict return precautions discussed. Discussed HPI, physical exam and plan of care for this patient with attending Dr. Delford Field. The attending physician evaluated this patient as part of a shared visit and agrees with plan of care.   Portions of this note were generated with Scientist, clinical (histocompatibility and immunogenetics). Dictation errors may occur despite best attempts at proofreading.     Final Clinical Impression(s) / ED Diagnoses Final diagnoses:  Foreign body in right foot, initial encounter    Rx / DC Orders ED Discharge Orders         Ordered    doxycycline (VIBRAMYCIN) 100 MG capsule  2 times daily        11/23/20 1232           Shanon Ace, PA-C 11/23/20 1252    Koleen Distance, MD 11/23/20 1635

## 2020-11-23 NOTE — ED Triage Notes (Signed)
Pt reports she is here today due to wound on the bottom of her right foot and swelling.pt reports she is diabetic.

## 2020-11-23 NOTE — Discharge Instructions (Signed)
Watch for signs of infection from your foot which would be surrounding worsening redness, red streaking, pus draining from the wound or if you get fevers and chills.  Follow-up with your doctor to have a wound check in 2 days.  You should do this since you are diabetic we want to make sure that the wound heals properly.  Follow-up with Ortho doctor Dr. Lajoyce Corners as we discussed talk about the fracture in your foot.  Return to ER with new or worsening conditions

## 2020-11-27 LAB — AEROBIC CULTURE W GRAM STAIN (SUPERFICIAL SPECIMEN): Gram Stain: NONE SEEN

## 2020-11-28 ENCOUNTER — Telehealth: Payer: Self-pay | Admitting: Emergency Medicine

## 2020-11-28 NOTE — Telephone Encounter (Signed)
Post ED Visit - Positive Culture Follow-up  Culture report reviewed by antimicrobial stewardship pharmacist: [facility] []  , Pharm.D. []  Enzo Bi, Pharm.D., BCPS AQ-ID []  , Pharm.D., BCPS []  Celedonio Miyamoto, Pharm.D., BCPS []  [place], Garvin Fila.D., BCPS, AAHIVP []  , Pharm.D., BCPS, AAHIVP []  Georgina Pillion, PharmD, BCPS []  , PharmD, BCPS []  [place], PharmD, BCPS []  [address], PharmD []  , PharmD, BCPS []  Estella Husk, PharmD PharmD  [facility] []  , PharmD []  Phillips Climes, PharmD []  , PharmD []  Agapito Games, Rph []  ) Verlan Friends, PharmD []  , PharmD []  Mervyn Gay, PharmD []  , PharmD []  Vinnie Level, PharmD []  Margarite Gouge, PharmD []  [facility], PharmD []  , PharmD []  Len Childs, PharmD   Positive wound culture Treated with doxycycline, to followup with ortho on 11/29/2020 and no further patient follow-up is required at this time.  Greer Pickerel 11/28/2020, 9:00 AM

## 2020-11-29 ENCOUNTER — Ambulatory Visit (INDEPENDENT_AMBULATORY_CARE_PROVIDER_SITE_OTHER): Payer: Medicaid Other

## 2020-11-29 ENCOUNTER — Ambulatory Visit (INDEPENDENT_AMBULATORY_CARE_PROVIDER_SITE_OTHER): Payer: Medicaid Other | Admitting: Podiatry

## 2020-11-29 ENCOUNTER — Other Ambulatory Visit: Payer: Self-pay

## 2020-11-29 DIAGNOSIS — S90851A Superficial foreign body, right foot, initial encounter: Secondary | ICD-10-CM | POA: Diagnosis not present

## 2020-11-29 DIAGNOSIS — S91331S Puncture wound without foreign body, right foot, sequela: Secondary | ICD-10-CM

## 2020-11-29 MED ORDER — MUPIROCIN 2 % EX OINT: 1.0000 "application " | TOPICAL_OINTMENT | Freq: Two times a day (BID) | CUTANEOUS | 2 refills | Status: DC

## 2020-12-03 ENCOUNTER — Encounter: Payer: Self-pay | Admitting: Podiatry

## 2020-12-03 NOTE — Progress Notes (Signed)
  Subjective:  Patient ID: Martha Lozano, female    DOB: 01/10/1961,  MRN: [id number]  Chief Complaint  Patient presents with  . Foot Injury     np right foot injury-referred by [facility] - xrays done 11/23/20    60 y.o. female presents with the above complaint. History confirmed with patient.  She is a type II diabetic.  She just got her A1c checked but does not have the results.  She stepped on a piece of glass on 11/23/2020 was removed at the urgent care.  She is currently taking antibiotic.  Objective:  Physical Exam: warm, good capillary refill and normal DP and PT pulses.  Right foot plantar wound proximal midfoot on lateral portion nearly fully healed.  Radiographs: X-ray of the right foot: No foreign body remaining in today's x-rays, no evidence of osteomyelitis or gas Assessment:   1. Foreign body in right foot, initial encounter   2. Puncture wound of right foot, sequela      Plan:  Patient was evaluated and treated and all questions answered.  Seems of had the entire foreign body removed already.  I could not detect any further evidence of retained glass.  The puncture wound seems to be healing well.  She will complete her oral antibiotic she is currently on.  I also prescribed her mupirocin ointment to begin applied for local wound care.  Expected to be healed up by next visit  Return in about 4 weeks (around 12/27/2020) for wound check.

## 2020-12-27 ENCOUNTER — Other Ambulatory Visit: Payer: Self-pay

## 2020-12-27 ENCOUNTER — Encounter: Payer: Self-pay | Admitting: Podiatry

## 2020-12-27 ENCOUNTER — Ambulatory Visit (INDEPENDENT_AMBULATORY_CARE_PROVIDER_SITE_OTHER): Payer: Medicaid Other | Admitting: Podiatry

## 2020-12-27 DIAGNOSIS — G5792 Unspecified mononeuropathy of left lower limb: Secondary | ICD-10-CM

## 2020-12-27 DIAGNOSIS — S90851A Superficial foreign body, right foot, initial encounter: Secondary | ICD-10-CM

## 2020-12-27 NOTE — Progress Notes (Signed)
  Subjective:  Patient ID: Martha Lozano, female    DOB: Apr 28, 1961,  MRN: [id number]  Chief Complaint  Patient presents with   Foot Ulcer     Return in about 4 weeks (around 12/27/2020) for wound check    60 y.o. female returns for follow-up with the above complaint. History confirmed with patient.  Doing much better, the puncture wound on the right side has completely healed and is no longer painful.  She does have some pain burning across the top of the foot over of a bone spur in the middle the arch  Objective:  Physical Exam: warm, good capillary refill and normal DP and PT pulses.  Right foot plantar wound proximal midfoot on lateral portion is now fully healed.  She has diffuse pain over the dorsal mid arch on the left foot  Radiographs: X-ray of the right foot: No foreign body remaining in today's x-rays, no evidence of osteomyelitis or gas Assessment:   1. Foreign body in right foot, initial encounter   2. Neuritis of left foot      Plan:  Patient was evaluated and treated and all questions answered.  Puncture wound has healed, no evidence of retained foreign body or signs of infection today.  Continue to monitor if it comes back would favor MRI or ultrasound to evaluate for retained body  Pain she is having a left foot I think is likely compression neuritis.  Reviewed lacing patterns and avoid straps across the top of the foot.  Recommend she use Voltaren gel as needed.  Return if symptoms worsen or fail to improve.

## 2020-12-27 NOTE — Patient Instructions (Signed)
Look for Voltaren gel at the pharmacy over the counter or online (also known as diclofenac 1% gel). Apply to the painful areas 3-4x daily with the supplied dosing card. Allow to dry for 10 minutes before going into socks/shoes  

## 2021-01-02 ENCOUNTER — Other Ambulatory Visit: Payer: Self-pay | Admitting: Physician Assistant

## 2021-01-02 DIAGNOSIS — Z122 Encounter for screening for malignant neoplasm of respiratory organs: Secondary | ICD-10-CM

## 2021-01-05 ENCOUNTER — Other Ambulatory Visit: Payer: Self-pay

## 2021-01-05 ENCOUNTER — Ambulatory Visit
Admission: RE | Admit: 2021-01-05 | Discharge: 2021-01-05 | Disposition: A | Discharge status: Home / Self Care | Payer: Medicaid Other | Source: Ambulatory Visit | Attending: Hematology and Oncology | Admitting: Hematology and Oncology

## 2021-01-05 DIAGNOSIS — D0511 Intraductal carcinoma in situ of right breast: Secondary | ICD-10-CM

## 2021-01-30 ENCOUNTER — Ambulatory Visit
Admission: RE | Admit: 2021-01-30 | Discharge: 2021-01-30 | Disposition: A | Discharge status: Home / Self Care | Payer: Medicaid Other | Source: Ambulatory Visit | Attending: Physician Assistant | Admitting: Physician Assistant

## 2021-01-30 DIAGNOSIS — Z122 Encounter for screening for malignant neoplasm of respiratory organs: Secondary | ICD-10-CM

## 2021-04-13 ENCOUNTER — Telehealth: Payer: Self-pay | Admitting: Hematology and Oncology

## 2021-04-13 NOTE — Telephone Encounter (Signed)
R/s appt per patient request. Called and spoke with patient. Confirmed appt

## 2021-04-17 ENCOUNTER — Inpatient Hospital Stay: Payer: Medicaid Other | Admitting: Hematology and Oncology

## 2021-04-22 NOTE — Progress Notes (Signed)
Patient Care Team: Sherald Hess., MD as PCP - General (Family Medicine)  DIAGNOSIS:    ICD-10-CM   1. Ductal carcinoma in situ (DCIS) of right breast  D05.11       SUMMARY OF ONCOLOGIC HISTORY: Oncology History  Ductal carcinoma in situ (DCIS) of right breast  09/11/2017 Initial Diagnosis   Pleomorphic and branching linear calcifications right breast lateral aspect, stereotactic biopsy of right UOQ and right posterior UOQ revealed intermediate grade DCIS ER 100%, PR 90%, Tis NX stage 0   10/29/2017 Miscellaneous   COMET clinical trial: Patient randomized to active surveillance   10/29/2017 -  Anti-estrogen oral therapy   Tamoxifen 20 mg daily   11/01/2017 Genetic Testing   The Common Hereditary Cancers Panel + Melanoma Panel + Basal Cell Nevus panel was ordered (53 genes). The following genes were evaluated for sequence changes and exonic deletions/duplications: APC, ATM, AXIN2, BAP1, BARD1, BMPR1A, BRCA1, BRCA2, BRIP1, CDH1, CDK4, CDKN2A (p14ARF), CDKN2A (p16INK4a), CHEK2, CTNNA1, DICER1, EPCAM*, GREM1*, KIT, MEN1, MLH1, MSH2, MSH3, MSH6, MUTYH, NBN, NF1, PALB2, PDGFRA, PMS2, POLD1, POLE, POT1, PTCH1, PTEN, RAD50, RAD51C, RAD51D, RB1, SDHB, SDHC, SDHD, SMAD4, SMARCA4, STK11, SUFU, TP53, TSC1, TSC2, VHL. The following genes were evaluated for sequence changes only: HOXB13*, MITF*, NTHL1*, SDHA    Results: No pathogenic variants identified.  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  VUS's should not be used to make medical management decisions.  The date of this test report is 11/01/2017.      CHIEF COMPLIANT: Follow-up of right breast DCIS on neoadjuvant tamoxifen   INTERVAL HISTORY: Martha Lozano is a 60 y.o. with above-mentioned history of right breast DCIS who is currently on neoadjuvant tamoxifen. She was a participant in the Comet clinical trial, however she was incarcerated in 2021 and was withdrawn from the  study. She presents to the clinic today for follow-up.   ALLERGIES:  has No Known Allergies.  MEDICATIONS:  Current Outpatient Medications  Medication Sig Dispense Refill   amitriptyline (ELAVIL) 50 MG tablet Take 1 tablet (50 mg total) by mouth at bedtime.     atorvastatin (LIPITOR) 20 MG tablet Take 1 tablet (20 mg total) by mouth daily.     Buprenorphine HCl-Naloxone HCl (SUBOXONE) 12-3 MG FILM Place 1 Film under the tongue 2 (two) times daily.      cephALEXin (KEFLEX) 500 MG capsule Take 500 mg by mouth 2 (two) times daily.     CVS D3 5000 units capsule Take 5,000 Units by mouth daily.  3   gabapentin (NEURONTIN) 100 MG capsule Take 100 mg by mouth 3 (three) times daily.     gabapentin (NEURONTIN) 400 MG capsule Take by mouth.     glipiZIDE (GLUCOTROL XL) 5 MG 24 hr tablet Take 1 tablet (5 mg total) by mouth daily with breakfast. 90 tablet 3   ibuprofen (ADVIL) 400 MG tablet Take by mouth.     levothyroxine (SYNTHROID) 100 MCG tablet Take 1 tablet (100 mcg total) by mouth daily before breakfast.     losartan (COZAAR) 50 MG tablet Take 1 tablet (50 mg total) by mouth daily. 30 tablet 0   meloxicam (MOBIC) 15 MG tablet Take 1 tablet by mouth daily.     metFORMIN (GLUCOPHAGE) 500 MG tablet Take 1 tablet (500 mg total) by mouth daily with breakfast. (Patient taking differently: Take 1,000 mg by mouth 2 (two) times daily with a meal. ) 90 tablet 1  metoprolol tartrate (LOPRESSOR) 50 MG tablet TAKE 1 TABLET BY MOUTH EVERY DAY 90 tablet 0   mupirocin ointment (BACTROBAN) 2 % Apply 1 application topically 2 (two) times daily. 30 g 2   naloxone (NARCAN) nasal spray 4 mg/0.1 mL SMARTSIG:Both Nares     tamoxifen (NOLVADEX) 20 MG tablet Take 1 tablet (20 mg total) by mouth daily. 90 tablet 3   venlafaxine XR (EFFEXOR-XR) 37.5 MG 24 hr capsule Take by mouth.     No current facility-administered medications for this visit.    PHYSICAL EXAMINATION: ECOG PERFORMANCE STATUS: 1 - Symptomatic but  completely ambulatory  Vitals:   04/24/21 1100  BP: 135/67  Pulse: 76  Resp: 18  Temp: (!) 97.2 F (36.2 C)  SpO2: 100%   Filed Weights   04/24/21 1100  Weight: 120 lb 9.6 oz (54.7 kg)    BREAST: No palpable masses or nodules in either right or left breasts. No palpable axillary supraclavicular or infraclavicular adenopathy no breast tenderness or nipple discharge. (exam performed in the presence of a chaperone)  LABORATORY DATA:  I have reviewed the data as listed CMP Latest Ref Rng & Units 11/23/2020 03/09/2014 02/12/2014  Glucose 70 - 99 mg/dL 287(H) 161(H) 207(H)  BUN 6 - 20 mg/dL 9 8 5(L)  Creatinine 0.44 - 1.00 mg/dL 0.78 0.71 0.83  Sodium 135 - 145 mmol/L 134(L) 138 136(L)  Potassium 3.5 - 5.1 mmol/L 4.4 4.1 5.0  Chloride 98 - 111 mmol/L 99 103 100  CO2 22 - 32 mmol/L 28 29 25   Calcium 8.9 - 10.3 mg/dL 8.9 8.7 8.9  Total Protein 6.5 - 8.1 g/dL 6.7 6.8 7.3  Total Bilirubin 0.3 - 1.2 mg/dL 0.6 1.9(H) 1.2  Alkaline Phos 38 - 126 U/L 55 137(H) 161(H)  AST 15 - 41 U/L 21 64(H) 73(H)  ALT 0 - 44 U/L 17 44(H) 49(H)    Lab Results  Component Value Date   WBC 8.0 11/23/2020   HGB 12.5 11/23/2020   HCT 39.9 11/23/2020   MCV 92.1 11/23/2020   PLT 206 11/23/2020   NEUTROABS 2.7 03/09/2014    ASSESSMENT & PLAN:  Ductal carcinoma in situ (DCIS) of right breast 09/11/2017:Pleomorphic and branching linear calcifications right breast lateral aspect, stereotactic biopsy of right UOQ and right posterior UOQ revealed intermediate grade DCIS ER 100%, PR 90%, Tis NX stage 0   Current treatment: She was on COMET clinical trial, active surveillance, tamoxifen 20 mg daily started 10/29/2017, when she got incarcerated we had to remove her from the trial.  However patient wishes to follow active surveillance off the trial.   Tamoxifen toxicities: Denies any side effects to tamoxifen.  She does have sweats at night which have been there before as well. She continued the tamoxifen even  while she was incarcerated.   Chronic pain issues: Follows with a pain management physician.   Plan: Bilateral mammogram 01/05/2021: Stable DCIS, breast density category B We will plan for mammograms to be done every 6 months on the right breast and bilaterally annually.   Return to clinic in 6 months with another mammogram and follow-up    No orders of the defined types were placed in this encounter.  The patient has a good understanding of the overall plan. she agrees with it. she will call with any problems that may develop before the next visit here.  Total time spent: 20 mins including face to face time and time spent for planning, charting and coordination of care  Rulon Eisenmenger, MD, MPH 04/24/2021  I, Thana Ates, am acting as scribe for Dr. Nicholas Lose.  I have reviewed the above documentation for accuracy and completeness, and I agree with the above.

## 2021-04-24 ENCOUNTER — Inpatient Hospital Stay: Payer: Medicaid Other | Attending: Hematology and Oncology | Admitting: Hematology and Oncology

## 2021-04-24 ENCOUNTER — Other Ambulatory Visit: Payer: Self-pay

## 2021-04-24 DIAGNOSIS — Z79899 Other long term (current) drug therapy: Secondary | ICD-10-CM | POA: Insufficient documentation

## 2021-04-24 DIAGNOSIS — D0511 Intraductal carcinoma in situ of right breast: Secondary | ICD-10-CM | POA: Diagnosis not present

## 2021-04-24 DIAGNOSIS — G8929 Other chronic pain: Secondary | ICD-10-CM | POA: Diagnosis not present

## 2021-04-24 NOTE — Assessment & Plan Note (Signed)
09/11/2017:Pleomorphic and branching linear calcifications right breast lateral aspect, stereotactic biopsy of right UOQ and right posterior UOQ revealed intermediate grade DCIS ER 100%, PR 90%, Tis NX stage 0  Current treatment:She was on COMETclinical trial, active surveillance, tamoxifen 20 mg daily started 10/29/2017, when she got incarcerated we had to remove her from the trial.  However patient wishes to follow active surveillance of the trial.  Tamoxifen toxicities:Denies any side effects to tamoxifen. She does have sweats at night which have been there before as well. She continued the tamoxifen even while she was incarcerated.  Chronic pain issues: Follows with a pain management physician.  Plan:Bilateral mammogram6/23/2022: Stable DCIS, breast density category B We will plan for mammograms to be done every 6 months on the right breast and bilaterally annually.  Return to clinicin6 months with another mammogram and follow-up

## 2021-08-31 ENCOUNTER — Telehealth: Payer: Self-pay | Admitting: *Deleted

## 2021-08-31 NOTE — Telephone Encounter (Signed)
Received vm from pt.  RN attempt x1 to return call, no answer.  LVM for pt to return call to the office.

## 2021-10-03 ENCOUNTER — Telehealth: Payer: Self-pay | Admitting: Hematology and Oncology

## 2021-10-03 NOTE — Telephone Encounter (Signed)
Rescheduled appointment per providers template. Left message.  ? ?

## 2021-10-12 ENCOUNTER — Telehealth: Payer: Self-pay

## 2021-10-12 NOTE — Telephone Encounter (Signed)
Pt called and states there is a scheduling conflict with upcoming appt and would like to r/s. Message sent to scheduling for them to call her to r/s.  ?

## 2021-10-16 ENCOUNTER — Ambulatory Visit
Admission: RE | Admit: 2021-10-16 | Discharge: 2021-10-16 | Disposition: A | Discharge status: Home / Self Care | Payer: Medicaid Other | Source: Ambulatory Visit | Attending: Hematology and Oncology | Admitting: Hematology and Oncology

## 2021-10-16 ENCOUNTER — Telehealth: Payer: Self-pay | Admitting: *Deleted

## 2021-10-16 ENCOUNTER — Other Ambulatory Visit: Payer: Self-pay | Admitting: Hematology and Oncology

## 2021-10-16 DIAGNOSIS — D0511 Intraductal carcinoma in situ of right breast: Secondary | ICD-10-CM

## 2021-10-16 NOTE — Telephone Encounter (Signed)
Received VM from pt.  RN attempt x1 to return call, no answer. LVM for pt to return call to the office.  

## 2021-10-23 ENCOUNTER — Ambulatory Visit: Payer: Medicaid Other | Admitting: Hematology and Oncology

## 2021-10-30 NOTE — Progress Notes (Incomplete)
? ?Patient Care Team: ?Sherald Hess., MD as PCP - General (Family Medicine) ? ?DIAGNOSIS: No diagnosis found. ? ?SUMMARY OF ONCOLOGIC HISTORY: ?Oncology History  ?Ductal carcinoma in situ (DCIS) of right breast  ?09/11/2017 Initial Diagnosis  ? 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 ?  ?10/29/2017 Miscellaneous  ? COMET clinical trial: Patient randomized to active surveillance ? ?  ?10/29/2017 -  Anti-estrogen oral therapy  ? Tamoxifen 20 mg daily ? ?  ?11/01/2017 Genetic Testing  ? The Common Hereditary Cancers Panel + Melanoma Panel + Basal Cell Nevus panel was ordered (53 genes). The following genes were evaluated for sequence changes and exonic deletions/duplications: APC, ATM, AXIN2, BAP1, BARD1, BMPR1A, BRCA1, BRCA2, BRIP1, CDH1, CDK4, CDKN2A (p14ARF), CDKN2A (p16INK4a), CHEK2, CTNNA1, DICER1, EPCAM*, GREM1*, KIT, MEN1, MLH1, MSH2, MSH3, MSH6, MUTYH, NBN, NF1, PALB2, PDGFRA, PMS2, POLD1, POLE, POT1, PTCH1, PTEN, RAD50, RAD51C, RAD51D, RB1, SDHB, SDHC, SDHD, SMAD4, SMARCA4, STK11, SUFU, TP53, TSC1, TSC2, VHL. The following genes were evaluated for sequence changes only: HOXB13*, MITF*, NTHL1*, SDHA   ? ?Results: No pathogenic variants identified.  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  VUS's should not be used to make medical management decisions.  The date of this test report is 11/01/2017.  ? ?  ? ? ?CHIEF COMPLIANT: Follow-up of right breast DCIS on neoadjuvant tamoxifen  ? ?INTERVAL HISTORY: Martha Lozano is a  61 y.o. with above-mentioned history of right breast DCIS who is currently on neoadjuvant tamoxifen.  She presents to the clinic today for follow-up ? ? ?ALLERGIES:  has No Known Allergies. ? ?MEDICATIONS:  ?Current Outpatient Medications  ?Medication Sig Dispense Refill  ?  atorvastatin (LIPITOR) 20 MG tablet Take 1 tablet (20 mg total) by mouth daily.    ? Buprenorphine HCl-Naloxone HCl (SUBOXONE) 12-3 MG FILM Place 1 Film under the tongue 2 (two) times daily.     ? CVS D3 5000 units capsule Take 5,000 Units by mouth daily.  3  ? gabapentin (NEURONTIN) 100 MG capsule Take 100 mg by mouth 3 (three) times daily.    ? glipiZIDE (GLUCOTROL XL) 5 MG 24 hr tablet Take 1 tablet (5 mg total) by mouth daily with breakfast. 90 tablet 3  ? levothyroxine (SYNTHROID) 100 MCG tablet Take 1 tablet (100 mcg total) by mouth daily before breakfast.    ? losartan (COZAAR) 50 MG tablet Take 1 tablet (50 mg total) by mouth daily. 30 tablet 0  ? meloxicam (MOBIC) 15 MG tablet Take 1 tablet by mouth daily.    ? metFORMIN (GLUCOPHAGE) 500 MG tablet Take 1 tablet (500 mg total) by mouth daily with breakfast. (Patient taking differently: Take 1,000 mg by mouth 2 (two) times daily with a meal. ) 90 tablet 1  ? metoprolol tartrate (LOPRESSOR) 50 MG tablet TAKE 1 TABLET BY MOUTH EVERY DAY 90 tablet 0  ? naloxone (NARCAN) nasal spray 4 mg/0.1 mL SMARTSIG:Both Nares    ? tamoxifen (NOLVADEX) 20 MG tablet Take 1 tablet (20 mg total) by mouth daily. 90 tablet 3  ? venlafaxine XR (EFFEXOR-XR) 37.5 MG 24 hr capsule Take by mouth.    ? ?No current facility-administered medications for this visit.  ? ? ?PHYSICAL EXAMINATION: ?ECOG PERFORMANCE STATUS: {CHL ONC ECOG YS:[id number]} ? ?There were no vitals filed for this visit. ?There were  no vitals filed for this visit. ? ?BREAST:*** No palpable masses or nodules in either right or left breasts. No palpable axillary supraclavicular or infraclavicular adenopathy no breast tenderness or nipple discharge. (exam performed in the presence of a chaperone) ? ?LABORATORY DATA:  ?I have reviewed the data as listed ? ?  Latest Ref Rng & Units 11/23/2020  ?  9:14 AM 03/09/2014  ?  9:16 AM 02/12/2014  ?  5:13 PM  ?CMP  ?Glucose 70 - 99 mg/dL 287   161   207    ?BUN 6 - 20 mg/dL 9   8   5      ?Creatinine 0.44 - 1.00 mg/dL 0.78   0.71   0.83    ?Sodium 135 - 145 mmol/L 134   138   136    ?Potassium 3.5 - 5.1 mmol/L 4.4   4.1   5.0    ?Chloride 98 - 111 mmol/L 99   103   100    ?CO2 22 - 32 mmol/L 28   29   25     ?Calcium 8.9 - 10.3 mg/dL 8.9   8.7   8.9    ?Total Protein 6.5 - 8.1 g/dL 6.7   6.8   7.3    ?Total Bilirubin 0.3 - 1.2 mg/dL 0.6   1.9   1.2    ?Alkaline Phos 38 - 126 U/L 55   137   161    ?AST 15 - 41 U/L 21   64   73    ?ALT 0 - 44 U/L 17   44   49    ? ? ?Lab Results  ?Component Value Date  ? WBC 8.0 11/23/2020  ? HGB 12.5 11/23/2020  ? HCT 39.9 11/23/2020  ? MCV 92.1 11/23/2020  ? PLT 206 11/23/2020  ? NEUTROABS 2.7 03/09/2014  ? ? ?ASSESSMENT & PLAN:  ?No problem-specific Assessment & Plan notes found for this encounter. ? ? ? ?No orders of the defined types were placed in this encounter. ? ?The patient has a good understanding of the overall plan. she agrees with it. she will call with any problems that may develop before the next visit here. ?Total time spent: 30 mins including face to face time and time spent for planning, charting and co-ordination of care ? ? Suzzette Righter, CMA ?10/30/21 ? ? ? I Gardiner Coins am scribing for Dr. Lindi Adie ? ?***  ?

## 2021-11-06 ENCOUNTER — Ambulatory Visit: Payer: Medicaid Other | Admitting: Hematology and Oncology

## 2021-11-09 ENCOUNTER — Other Ambulatory Visit: Payer: Self-pay

## 2021-11-09 ENCOUNTER — Encounter (HOSPITAL_COMMUNITY): Payer: Self-pay | Admitting: *Deleted

## 2021-11-09 ENCOUNTER — Emergency Department (HOSPITAL_COMMUNITY)
Admission: EM | Admit: 2021-11-09 | Discharge: 2021-11-09 | Disposition: A | Discharge status: Home / Self Care | Payer: Medicaid Other | Attending: Emergency Medicine | Admitting: Emergency Medicine

## 2021-11-09 ENCOUNTER — Emergency Department (HOSPITAL_COMMUNITY): Payer: Medicaid Other

## 2021-11-09 DIAGNOSIS — Z79899 Other long term (current) drug therapy: Secondary | ICD-10-CM | POA: Insufficient documentation

## 2021-11-09 DIAGNOSIS — S90121A Contusion of right lesser toe(s) without damage to nail, initial encounter: Secondary | ICD-10-CM | POA: Diagnosis not present

## 2021-11-09 DIAGNOSIS — X58XXXA Exposure to other specified factors, initial encounter: Secondary | ICD-10-CM | POA: Diagnosis not present

## 2021-11-09 DIAGNOSIS — E119 Type 2 diabetes mellitus without complications: Secondary | ICD-10-CM | POA: Insufficient documentation

## 2021-11-09 DIAGNOSIS — M79671 Pain in right foot: Secondary | ICD-10-CM

## 2021-11-09 DIAGNOSIS — S99921A Unspecified injury of right foot, initial encounter: Secondary | ICD-10-CM | POA: Diagnosis present

## 2021-11-09 DIAGNOSIS — Z7984 Long term (current) use of oral hypoglycemic drugs: Secondary | ICD-10-CM | POA: Insufficient documentation

## 2021-11-09 DIAGNOSIS — I10 Essential (primary) hypertension: Secondary | ICD-10-CM | POA: Diagnosis not present

## 2021-11-09 LAB — COMPREHENSIVE METABOLIC PANEL
ALT: 16 U/L (ref 0–44)
AST: 20 U/L (ref 15–41)
Albumin: 3.8 g/dL (ref 3.5–5.0)
Alkaline Phosphatase: 57 U/L (ref 38–126)
Anion gap: 7 (ref 5–15)
BUN: 13 mg/dL (ref 6–20)
CO2: 27 mmol/L (ref 22–32)
Calcium: 9 mg/dL (ref 8.9–10.3)
Chloride: 102 mmol/L (ref 98–111)
Creatinine, Ser: 0.84 mg/dL (ref 0.44–1.00)
GFR, Estimated: >60 mL/min (ref >60)
Glucose, Bld: 164 mg/dL — ABNORMAL HIGH (ref 70–99)
Potassium: 3.4 mmol/L — ABNORMAL LOW (ref 3.5–5.1)
Sodium: 136 mmol/L (ref 135–145)
Total Bilirubin: 0.2 mg/dL — ABNORMAL LOW (ref 0.3–1.2)
Total Protein: 7.2 g/dL (ref 6.5–8.1)

## 2021-11-09 LAB — CBC WITH DIFFERENTIAL/PLATELET
Abs Immature Granulocytes: 0.02 10*3/uL (ref 0.00–0.07)
Basophils Absolute: 0.1 10*3/uL (ref 0.0–0.1)
Basophils Relative: 1 %
Eosinophils Absolute: 0.1 10*3/uL (ref 0.0–0.5)
Eosinophils Relative: 1 %
HCT: 40.4 % (ref 36.0–46.0)
Hemoglobin: 13.2 g/dL (ref 12.0–15.0)
Immature Granulocytes: 0 %
Lymphocytes Relative: 28 %
Lymphs Abs: 2.3 10*3/uL (ref 0.7–4.0)
MCH: 30.3 pg (ref 26.0–34.0)
MCHC: 32.7 g/dL (ref 30.0–36.0)
MCV: 92.9 fL (ref 80.0–100.0)
Monocytes Absolute: 0.7 10*3/uL (ref 0.1–1.0)
Monocytes Relative: 8 %
Neutro Abs: 5.2 10*3/uL (ref 1.7–7.7)
Neutrophils Relative %: 62 %
Platelets: 165 10*3/uL (ref 150–400)
RBC: 4.35 MIL/uL (ref 3.87–5.11)
RDW: 12.9 % (ref 11.5–15.5)
WBC: 8.4 10*3/uL (ref 4.0–10.5)
nRBC: 0 % (ref 0.0–0.2)

## 2021-11-09 MED ORDER — CEPHALEXIN 500 MG PO CAPS: 500.0000 mg | ORAL_CAPSULE | Freq: Four times a day (QID) | ORAL | 0 refills | Status: DC

## 2021-11-09 NOTE — ED Triage Notes (Signed)
Pt states she noticed the top of her foot was red and on the bottom of her foot pt has purple blister; pt states she first noticed the redness last night ?

## 2021-11-09 NOTE — Discharge Instructions (Addendum)
Continue symptomatic therapy with rest, ice, elevation of affected extremity.  I presume your body will take care of the pooling of blood in between your toes.  I will prescribe 5 days of antibiotic for the redness on the top of your foot.  Follow-up as needed with her primary care provider if symptoms do not get better or get worse.  Do not hesitate to return to the emergency department if worrisome signs and symptoms we discussed are noticed. ?

## 2021-11-09 NOTE — ED Provider Notes (Signed)
?[facility] ?Provider Note ? ? ?CSN: [id number] ?Arrival date & time: 11/09/21  1749 ? ?  ? ?History ? ?Chief Complaint  ?Patient presents with  ? Foot Pain  ? ? ?Martha Lozano is a 61 y.o. female. ? ? ?Foot Pain ?Pertinent negatives include no chest pain, no abdominal pain, no headaches and no shortness of breath.  ?61 year old female presents emergency department with right foot pain this been present since 2 PM today.  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  She says that she noticed increase in pain after walking on her feet today.  She removed her shoe and noticed collected blood in between her second and third digits as well as a red rash on the dorsal aspect of her right foot.  She denies prior foot infection including osteomyelitis.  She denies knowledge of injury to affected foot, but states she does not have much feeling to recognize when she has injured herself.  She denies fever, chills, night sweats, abdominal pain, nausea/vomiting/diarrhea, urinary symptoms or vaginal symptoms.  She has been compliant with her diabetic medications and states that prior blood glucose readings have been within normal range. ? ?History significant for diabetes, hypertension, smoking. ? ?Home Medications ?Prior to Admission medications   ?Medication Sig Start Date End Date Taking? Authorizing Provider  ?atorvastatin (LIPITOR) 20 MG tablet Take 1 tablet (20 mg total) by mouth daily. 11/15/20   Nicholas Lose, MD  ?Buprenorphine HCl-Naloxone HCl (SUBOXONE) 12-3 MG FILM Place 1 Film under the tongue 2 (two) times daily.     [provider]  ?CVS D3 5000 units capsule Take 5,000 Units by mouth daily. 12/06/17   [provider]  ?gabapentin (NEURONTIN) 100 MG capsule Take 100 mg by mouth 3 (three) times daily. 11/22/20   [provider]  ?glipiZIDE (GLUCOTROL XL) 5 MG 24 hr tablet Take 1 tablet (5 mg total) by mouth daily with  breakfast. 11/06/18   Nicholas Lose, MD  ?levothyroxine (SYNTHROID) 100 MCG tablet Take 1 tablet (100 mcg total) by mouth daily before breakfast. 11/15/20   Nicholas Lose, MD  ?losartan (COZAAR) 50 MG tablet Take 1 tablet (50 mg total) by mouth daily. 11/10/18   Nicholas Lose, MD  ?meloxicam (MOBIC) 15 MG tablet Take 1 tablet by mouth daily. 11/21/20   [provider]  ?metFORMIN (GLUCOPHAGE) 500 MG tablet Take 1 tablet (500 mg total) by mouth daily with breakfast. ?Patient taking differently: Take 1,000 mg by mouth 2 (two) times daily with a meal.  03/02/14   Advani, Vernon Prey, MD  ?metoprolol tartrate (LOPRESSOR) 50 MG tablet TAKE 1 TABLET BY MOUTH EVERY DAY 11/11/18   Nicholas Lose, MD  ?naloxone [facility]) nasal spray 4 mg/0.1 mL SMARTSIG:Both Nares 11/08/20   [provider]  ?tamoxifen (NOLVADEX) 20 MG tablet Take 1 tablet (20 mg total) by mouth daily. 11/17/20   Magrinat, Virgie Dad, MD  ?venlafaxine XR (EFFEXOR-XR) 37.5 MG 24 hr capsule Take by mouth. 11/11/18   [provider]  ?   ? ?Allergies    ?Patient has no known allergies.   ? ?Review of Systems   ?Review of Systems  ?Constitutional:  Negative for chills, fatigue and fever.  ?HENT:  Negative for congestion and rhinorrhea.   ?Respiratory:  Negative for cough, shortness of breath and stridor.   ?Cardiovascular:  Negative for chest pain, palpitations and leg swelling.  ?Gastrointestinal:  Negative for abdominal pain, diarrhea, nausea and  vomiting.  ?Genitourinary:  Negative for difficulty urinating, dysuria, frequency, vaginal bleeding and vaginal discharge.  ?Musculoskeletal:  Negative for arthralgias and myalgias.  ?Skin:   ?     Small hematoma in between her second and third digits.  Pink rash on the dorsal aspect of her right foot.  Patient notes tongue of the shoe was rubbing foot.  ?Neurological:  Negative for dizziness, seizures, syncope, weakness and headaches.  ?Hematological:  Negative for adenopathy.  ? ?Physical Exam ?Updated Vital  Signs ?BP 110/89   Pulse (!) 108   Temp 98.1 ?F (36.7 ?C) (Oral)   Resp 20   Ht 5' (1.524 m)   Wt 50.8 kg   LMP 02/21/2016   SpO2 98%   BMI 21.87 kg/m?  ?Physical Exam ?Vitals and nursing note reviewed.  ?Constitutional:   ?   General: She is not in acute distress. ?   Appearance: Normal appearance. She is normal weight. She is not ill-appearing, toxic-appearing or diaphoretic.  ?HENT:  ?   Head: Normocephalic.  ?   Nose: Nose normal.  ?   Mouth/Throat:  ?   Mouth: Mucous membranes are moist.  ?   Pharynx: Oropharynx is clear.  ?Cardiovascular:  ?   Rate and Rhythm: Normal rate and regular rhythm.  ?   Pulses: Normal pulses.  ?   Heart sounds: Normal heart sounds.  ?Pulmonary:  ?   Effort: Pulmonary effort is normal.  ?   Breath sounds: Normal breath sounds.  ?Abdominal:  ?   General: Abdomen is flat.  ?   Palpations: Abdomen is soft.  ?   Tenderness: There is no abdominal tenderness.  ?Musculoskeletal:     ?   General: No swelling. Normal range of motion.  ?   Right lower leg: No edema.  ?   Left lower leg: No edema.  ?   Right foot: Normal range of motion.  ?   Left foot: Normal range of motion.  ?     Feet: ? ?Feet:  ?   Comments: Small hematoma located in the foot pad between her second and fourth digits of right foot.  No obvious openings.  No purulent drainage from site.  Patient has full range of motion of ankle including flexion, extension, inversion, eversion.  Full range of motion of the lower extremity digits bilaterally.  Strength 5 out of 5 with foot flexion extension.  Posterior tibial pulses weak upon palpation.  Patient denies increase in sensory deficits in lower extremities from baseline, major nerve distributions.  No lower extremity edema. ? ?Portion of the dorsal aspect of the right foot as pictured in gray above displayed cellulitic changes in skin.  Area was erythematic, slightly swollen, warm to the touch. ?Skin: ?   General: Skin is warm and dry.  ?   Capillary Refill: Capillary  refill takes less than 2 seconds.  ?Neurological:  ?   General: No focal deficit present.  ?   Mental Status: She is alert and oriented to person, place, and time.  ?Psychiatric:     ?   Mood and Affect: Mood normal.     ?   Behavior: Behavior normal.  ? ? ?ED Results / Procedures / Treatments   ?Labs ?(all labs ordered are listed, but only abnormal results are displayed) ?Labs Reviewed  ?COMPREHENSIVE METABOLIC PANEL  ?CBC WITH DIFFERENTIAL/PLATELET  ? ? ?EKG ?None ? ?Radiology ?No results found. ? ?Procedures ?Procedures  ? ? ?Medications Ordered in ED ?Medications -  No data to display ? ?ED Course/ Medical Decision Making/ A&P ?  ?                        ?Medical Decision Making ?Amount and/or Complexity of Data Reviewed ?Labs: ordered. ?Radiology: ordered. ? ?Risk ?Prescription drug management. ? ? ?This patient presents to the ED for concern of right foot pain, this involves an extensive number of treatment options, and is a complaint that carries with it a high risk of complications and morbidity.  The differential diagnosis includes osteomyelitis, fracture, hematoma, retained foreign body, other ligamentous/tendinous injuries, cellulitis ? ? ?Co morbidities that complicate the patient evaluation ? ?Hypertension, diabetes, chronic smoker. ? ? ?Additional history obtained: ? ?Additional history obtained from boyfriend who is at bedside. ? ? ?Lab Tests: ? ?I Ordered, and personally interpreted labs.  The pertinent results include: No acute abnormalities on lab tests ordered ? ? ?Imaging Studies ordered: ? ?I ordered imaging studies including x-ray of right foot ?I independently visualized and interpreted imaging which showed no acute abnormalities ?I agree with the radiologist interpretation ? ? ?Cardiac Monitoring: / EKG: ? ?The patient was maintained on a cardiac monitor.  I personally viewed and interpreted the cardiac monitored which showed an underlying rhythm of: Normal sinus rhythm ? ? ?Consultations  Obtained: ? ?No consultations obtained ? ?Problem List / ED Course / Critical interventions / Medication management ? ?Right foot pain ?Reevaluation of the patient showed that the patient stayed the same ?I have reviewe

## 2021-11-10 ENCOUNTER — Other Ambulatory Visit: Payer: Self-pay

## 2021-11-10 ENCOUNTER — Encounter (HOSPITAL_COMMUNITY): Payer: Self-pay

## 2021-11-10 ENCOUNTER — Emergency Department (HOSPITAL_COMMUNITY)
Admission: EM | Admit: 2021-11-10 | Discharge: 2021-11-10 | Disposition: A | Discharge status: Home / Self Care | Payer: Medicaid Other | Attending: Emergency Medicine | Admitting: Emergency Medicine

## 2021-11-10 DIAGNOSIS — X58XXXA Exposure to other specified factors, initial encounter: Secondary | ICD-10-CM | POA: Diagnosis not present

## 2021-11-10 DIAGNOSIS — E119 Type 2 diabetes mellitus without complications: Secondary | ICD-10-CM | POA: Insufficient documentation

## 2021-11-10 DIAGNOSIS — S99921A Unspecified injury of right foot, initial encounter: Secondary | ICD-10-CM | POA: Diagnosis present

## 2021-11-10 DIAGNOSIS — S90821A Blister (nonthermal), right foot, initial encounter: Secondary | ICD-10-CM | POA: Insufficient documentation

## 2021-11-10 DIAGNOSIS — Z853 Personal history of malignant neoplasm of breast: Secondary | ICD-10-CM | POA: Insufficient documentation

## 2021-11-10 DIAGNOSIS — L03115 Cellulitis of right lower limb: Secondary | ICD-10-CM | POA: Insufficient documentation

## 2021-11-10 DIAGNOSIS — L03119 Cellulitis of unspecified part of limb: Secondary | ICD-10-CM

## 2021-11-10 DIAGNOSIS — Z7984 Long term (current) use of oral hypoglycemic drugs: Secondary | ICD-10-CM | POA: Insufficient documentation

## 2021-11-10 MED ORDER — KETOROLAC TROMETHAMINE 15 MG/ML IJ SOLN
15.0000 mg | Freq: Once | INTRAMUSCULAR | Status: AC
  Administered 2021-11-10: 15 mg via INTRAMUSCULAR
  Filled 2021-11-10: qty 1

## 2021-11-10 NOTE — ED Provider Notes (Signed)
?[facility] EMERGENCY DEPARTMENT ?Provider Note ? ? ?CSN: [id number] ?Arrival date & time: 11/10/21  0901 ? ?  ? ?History ? ?Chief Complaint  ?Patient presents with  ? Foot Pain  ? ? ?Martha Lozano is a 61 y.o. female. ? ? ?Foot Pain ? ?Patient returns with right foot pain.  Was seen at [facility] yesterday for same.  Thought to be a cellulitis.  Had been given antibiotics.  Patient has not filled this yet.  Patient states the pain developed yesterday.  Has bruising below some of the toes.  States that she knows of no injury.  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  States the foot is more painful.  States there is more redness on the top of the foot.States sugar was a little elevated today. ?  ?Past Medical History:  ?Diagnosis Date  ? Breast cancer, stage 0, right   ? pt currently taking Tamoxifen  ? Diabetes mellitus without complication (HCC)   ? Family history of breast cancer   ? Family history of prostate cancer   ? Family history of skin cancer   ? Hep C w/o coma, chronic (HCC)   ? Hx of hepatitis C   ? pt received treatment for Hep C in 2018  ? Sciatic pain   ? ? ?Home Medications ?Prior to Admission medications   ?Medication Sig Start Date End Date Taking? Authorizing Provider  ?atorvastatin (LIPITOR) 20 MG tablet Take 1 tablet (20 mg total) by mouth daily. 11/15/20   Serena Croissant, MD  ?Buprenorphine HCl-Naloxone HCl (SUBOXONE) 12-3 MG FILM Place 1 Film under the tongue 2 (two) times daily.     [provider]  ?cephALEXin (KEFLEX) 500 MG capsule Take 1 capsule (500 mg total) by mouth 4 (four) times daily. 11/09/21   Peter Garter, PA  ?[facility] D3 5000 units capsule Take 5,000 Units by mouth daily. 12/06/17   [provider]  ?gabapentin (NEURONTIN) 100 MG capsule Take 100 mg by mouth 3 (three) times daily. 11/22/20   [provider]  ?glipiZIDE (GLUCOTROL XL) 5 MG 24 hr tablet Take 1 tablet (5 mg total) by mouth daily with breakfast. 11/06/18   Serena Croissant, MD  ?levothyroxine (SYNTHROID) 100 MCG tablet Take 1 tablet (100 mcg total) by mouth daily before breakfast. 11/15/20   Serena Croissant, MD  ?losartan (COZAAR) 50 MG tablet Take 1 tablet (50 mg total) by mouth daily. 11/10/18   Serena Croissant, MD  ?meloxicam (MOBIC) 15 MG tablet Take 1 tablet by mouth daily. 11/21/20   [provider]  ?metFORMIN (GLUCOPHAGE) 500 MG tablet Take 1 tablet (500 mg total) by mouth daily with breakfast. ?Patient taking differently: Take 1,000 mg by mouth 2 (two) times daily with a meal.  03/02/14   Advani, Ayesha Rumpf, MD  ?metoprolol tartrate (LOPRESSOR) 50 MG tablet TAKE 1 TABLET BY MOUTH EVERY DAY 11/11/18   Serena Croissant, MD  ?naloxone [facility]) nasal spray 4 mg/0.1 mL SMARTSIG:Both Nares 11/08/20   [provider]  ?tamoxifen (NOLVADEX) 20 MG tablet Take 1 tablet (20 mg total) by mouth daily. 11/17/20   Magrinat, Valentino Hue, MD  ?venlafaxine XR (EFFEXOR-XR) 37.5 MG 24 hr capsule Take by mouth. 11/11/18   [provider]  ?   ? ?Allergies    ?Patient has no known allergies.   ? ?Review of Systems   ?Review of Systems  ?Constitutional:  Negative for appetite change and fever.  ?Musculoskeletal:   ?  Right foot pain and wound.  ? ?Physical Exam ?Updated Vital Signs ?BP 129/78 (BP Location: Right Arm)   Pulse 99   Temp 98 ?F (36.7 ?C) (Oral)   Resp 15   Ht 5' (1.524 m)   Wt 50.8 kg   LMP 02/21/2016   SpO2 99%   BMI 21.87 kg/m?  ?Physical Exam ?Vitals and nursing note reviewed.  ?Cardiovascular:  ?   Rate and Rhythm: Regular rhythm.  ?Musculoskeletal:  ?   Cervical back: Neck supple.  ?   Comments: Tenderness to right midfoot.  Some dorsal erythema with slight warmth.  On the plantar aspect near the second and third toes there is a blister with apparent blood below it.  ?Neurological:  ?   Mental Status: She is alert.  ? ? ? ? ? ?ED Results / Procedures / Treatments   ?Labs ?(all labs ordered are listed, but only abnormal results are displayed) ?Labs Reviewed   ?AEROBIC CULTURE W GRAM STAIN (SUPERFICIAL SPECIMEN)  ? ? ?EKG ?None ? ?Radiology ?No results found. ? ?Procedures ?Procedures  ? ? ?Medications Ordered in ED ?Medications  ?ketorolac (TORADOL) 15 MG/ML injection 15 mg (15 mg Intramuscular Given 11/10/21 1043)  ? ? ?ED Course/ Medical Decision Making/ A&P ?  ?                        ?Medical Decision Making ?Risk ?Prescription drug management. ? ?Patient presents with foot pain.  Recently seen for same.  Had been prescribed antibiotics but did not take it yet.  Some erythema on dorsum of foot.  Potentially cellulitis.  However does have apparent blister full of blood on the plantar aspect of the foot.  Drained with just blood return.  Do not feel as by need blood work today.  X-ray done and showed no likely acute injury.  Will discharge home with outpatient follow-up.  Do not feel as if she needs change antibiotics and she does not feel the other ones. ? ? ? ? ? ? ? ?Final Clinical Impression(s) / ED Diagnoses ?Final diagnoses:  ?Cellulitis of foot  ?Blister (nonthermal), right foot, initial encounter  ? ? ?Rx / DC Orders ?ED Discharge Orders   ? ? None  ? ?  ? ? ?  ?Benjiman Core, MD ?11/14/21 1447 ? ?

## 2021-11-10 NOTE — ED Triage Notes (Signed)
Pt arrived POV from home c/o right foot pain that started yesterday. Pt's right foot is red and swollen and pt has a purple blister underneath her toes. Pt states she is a diabetic.  ?

## 2021-11-10 NOTE — ED Provider Notes (Signed)
INCISION AND DRAINAGE ?Performed by: Langston Masker ?Consent: Verbal consent obtained. ?Risks and benefits: risks, benefits and alternatives were discussed ?Type: abscess ? ?Body area: left foot hemtoma ? ?Anesthesia: none ? ?Incision was made with a scalpel. ? ?Local anesthetic: none ? ?Anesthetic total: ? ?Complexity: simple ? ? ?Drainage: bloody ? ?Drainage amount: 3cc ? ?Packing material: none ? ?Patient tolerance: Patient tolerated the procedure well with no immediate complications. ? ? ?  ?Elson Areas, [place] ?11/10/21 1036 ? ?  ?Benjiman Core, MD ?11/14/21 1459 ? ?

## 2021-11-10 NOTE — Discharge Instructions (Signed)
Continue the antibiotics.

## 2021-11-12 LAB — AEROBIC CULTURE W GRAM STAIN (SUPERFICIAL SPECIMEN): Gram Stain: NONE SEEN

## 2021-11-13 ENCOUNTER — Telehealth (HOSPITAL_BASED_OUTPATIENT_CLINIC_OR_DEPARTMENT_OTHER): Payer: Self-pay | Admitting: *Deleted

## 2021-11-13 ENCOUNTER — Inpatient Hospital Stay: Payer: Medicaid Other | Attending: Hematology and Oncology | Admitting: Hematology and Oncology

## 2021-11-13 DIAGNOSIS — Z170 Estrogen receptor positive status [ER+]: Secondary | ICD-10-CM | POA: Insufficient documentation

## 2021-11-13 DIAGNOSIS — G8929 Other chronic pain: Secondary | ICD-10-CM | POA: Insufficient documentation

## 2021-11-13 DIAGNOSIS — Z79899 Other long term (current) drug therapy: Secondary | ICD-10-CM | POA: Insufficient documentation

## 2021-11-13 DIAGNOSIS — Z79810 Long term (current) use of selective estrogen receptor modulators (SERMs): Secondary | ICD-10-CM | POA: Insufficient documentation

## 2021-11-13 DIAGNOSIS — D0511 Intraductal carcinoma in situ of right breast: Secondary | ICD-10-CM | POA: Insufficient documentation

## 2021-11-13 NOTE — Assessment & Plan Note (Deleted)
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 However patient wishes to follow active surveillance off the trial.  Tamoxifen toxicities:Denies any side effects to tamoxifen. She does have sweats at night which have been there before as well. She continued the tamoxifen even while she was incarcerated.  Chronic pain issues: Follows with a pain management physician.  Plan: 1. Bilateral mammogram6/23/2022: Stable DCIS, breast density category B 2. right mammogram 10/16/2021: Stable pleomorphic branching calcifications.  No suspicious mammographic findings  We will plan for mammograms to be done every 6 months on the right breast and bilaterally annually.  Return to clinicin6 months with another mammogram and follow-up

## 2021-11-13 NOTE — Telephone Encounter (Signed)
Post ED Visit - Positive Culture Follow-up ? ?Culture report reviewed by antimicrobial stewardship pharmacist: ?[facility] ?[]  , Enzo Bi.D. ?[]  [address], Pharm.D., BCPS AQ-ID ?[]  , Pharm.D., BCPS ?[]  Celedonio Miyamoto, .D., BCPS ?[]  [place], .D., BCPS, AAHIVP ?[]  Georgina Pillion, Pharm.D., BCPS, AAHIVP ?[]  [address], PharmD, BCPS ?[]  , PharmD, BCPS ?[]  [place], PharmD, BCPS ?[]  [address], PharmD ?[]  , PharmD, BCPS ?[x]   Estella Husk PharmD ? ? [facility] ?[]  Lysle Pearl, PharmD ?[]  , PharmD ?[]  Phillips Climes, PharmD ?[]  , Rph ?[]  Agapito Games) , PharmD ?[]  Verlan Friends, PharmD ?[]  , PharmD ?[]  Mervyn Gay, PharmD ?[]  , PharmD ?[]  Sula Soda, PharmD ?[]  Gerri Spore, PharmD ?[]  , PharmD ?[]  Len Childs, PharmD ? ? ?Positive aerobic culture with gram stain ?Treated with Cephalexin, No further treatment needed.  ? ?11/13/2021, 12:25 PM ?  ?

## 2021-11-16 ENCOUNTER — Telehealth: Payer: Self-pay | Admitting: Hematology and Oncology

## 2021-11-16 NOTE — Telephone Encounter (Signed)
.  Called pt per 5/2 inbasket , Patient was unavailable, a message with appt time and date was left with number on file.   ?

## 2021-11-20 ENCOUNTER — Inpatient Hospital Stay: Payer: Medicaid Other | Admitting: Hematology and Oncology

## 2021-11-20 ENCOUNTER — Telehealth: Payer: Self-pay | Admitting: Hematology and Oncology

## 2021-11-20 ENCOUNTER — Other Ambulatory Visit: Payer: Self-pay

## 2021-11-20 ENCOUNTER — Inpatient Hospital Stay (HOSPITAL_BASED_OUTPATIENT_CLINIC_OR_DEPARTMENT_OTHER): Payer: Medicaid Other | Admitting: Hematology and Oncology

## 2021-11-20 DIAGNOSIS — Z170 Estrogen receptor positive status [ER+]: Secondary | ICD-10-CM | POA: Diagnosis not present

## 2021-11-20 DIAGNOSIS — D0511 Intraductal carcinoma in situ of right breast: Secondary | ICD-10-CM | POA: Diagnosis present

## 2021-11-20 DIAGNOSIS — Z79810 Long term (current) use of selective estrogen receptor modulators (SERMs): Secondary | ICD-10-CM | POA: Diagnosis not present

## 2021-11-20 DIAGNOSIS — Z79899 Other long term (current) drug therapy: Secondary | ICD-10-CM | POA: Diagnosis not present

## 2021-11-20 DIAGNOSIS — G8929 Other chronic pain: Secondary | ICD-10-CM | POA: Diagnosis not present

## 2021-11-20 MED ORDER — TAMOXIFEN CITRATE 20 MG PO TABS: 20.0000 mg | ORAL_TABLET | Freq: Every day | ORAL | 3 refills | Status: DC

## 2021-11-20 NOTE — Assessment & Plan Note (Deleted)
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 However patient wishes to follow active surveillance off the trial.  Tamoxifen toxicities:Denies any side effects to tamoxifen. She does have sweats at night which have been there before as well.  She continued the tamoxifen even while she was incarcerated.  Chronic pain issues: Follows with a pain management physician.  Plan:Bilateral mammogram6/23/2022: Stable DCIS, breast density category B We will plan for mammograms to be done every 6 months on the right breast and bilaterally annually.  Return to clinicin1 year for follow-up

## 2021-11-20 NOTE — Progress Notes (Signed)
? ?Patient Care Team: ?Sherald Hess., MD as PCP - General (Family Medicine) ? ?DIAGNOSIS:  ?Encounter Diagnosis  ?Name Primary?  ? Ductal carcinoma in situ (DCIS) of right breast   ? ? ?SUMMARY OF ONCOLOGIC HISTORY: ?Oncology History  ?Ductal carcinoma in situ (DCIS) of right breast  ?09/11/2017 Initial Diagnosis  ? 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 ?  ?10/29/2017 Miscellaneous  ? COMET clinical trial: Patient randomized to active surveillance ? ?  ?10/29/2017 -  Anti-estrogen oral therapy  ? Tamoxifen 20 mg daily ? ?  ?11/01/2017 Genetic Testing  ? The Common Hereditary Cancers Panel + Melanoma Panel + Basal Cell Nevus panel was ordered (53 genes). The following genes were evaluated for sequence changes and exonic deletions/duplications: APC, ATM, AXIN2, BAP1, BARD1, BMPR1A, BRCA1, BRCA2, BRIP1, CDH1, CDK4, CDKN2A (p14ARF), CDKN2A (p16INK4a), CHEK2, CTNNA1, DICER1, EPCAM*, GREM1*, KIT, MEN1, MLH1, MSH2, MSH3, MSH6, MUTYH, NBN, NF1, PALB2, PDGFRA, PMS2, POLD1, POLE, POT1, PTCH1, PTEN, RAD50, RAD51C, RAD51D, RB1, SDHB, SDHC, SDHD, SMAD4, SMARCA4, STK11, SUFU, TP53, TSC1, TSC2, VHL. The following genes were evaluated for sequence changes only: HOXB13*, MITF*, NTHL1*, SDHA   ? ?Results: No pathogenic variants identified.  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  VUS's should not be used to make medical management decisions.  The date of this test report is 11/01/2017.  ? ?  ? ? ?CHIEF COMPLIANT: Follow-up on tamoxifen therapy ? ?INTERVAL HISTORY: Martha Lozano is a 61 year old above-mentioned history of right breast cancer who is currently on, clinical trial and is taking tamoxifen daily.  She reports no problems or side effects to tamoxifen.  Mammograms have remained stable.  She denies  any lumps or nodules in the breast.  She has a mammogram coming up in July.  She is very excited about going to the beach in June. ? ? ?ALLERGIES:  has No Known Allergies. ? ?MEDICATIONS:  ?Current Outpatient Medications  ?Medication Sig Dispense Refill  ? atorvastatin (LIPITOR) 20 MG tablet Take 1 tablet (20 mg total) by mouth daily.    ? Buprenorphine HCl-Naloxone HCl (SUBOXONE) 12-3 MG FILM Place 1 Film under the tongue 2 (two) times daily.     ? [facility] D3 5000 units capsule Take 5,000 Units by mouth daily.  3  ? gabapentin (NEURONTIN) 100 MG capsule Take 100 mg by mouth 3 (three) times daily.    ? glipiZIDE (GLUCOTROL XL) 5 MG 24 hr tablet Take 1 tablet (5 mg total) by mouth daily with breakfast. 90 tablet 3  ? levothyroxine (SYNTHROID) 100 MCG tablet Take 1 tablet (100 mcg total) by mouth daily before breakfast.    ? losartan (COZAAR) 50 MG tablet Take 1 tablet (50 mg total) by mouth daily. 30 tablet 0  ? meloxicam (MOBIC) 15 MG tablet Take 1 tablet by mouth daily.    ? metFORMIN (GLUCOPHAGE) 500 MG tablet Take 1 tablet (500 mg total) by mouth daily with breakfast. (Patient taking differently: Take 1,000 mg by mouth 2 (two) times daily with a meal. ) 90 tablet 1  ? metoprolol tartrate (LOPRESSOR) 50 MG tablet TAKE 1 TABLET BY MOUTH EVERY DAY 90 tablet 0  ? naloxone (NARCAN) nasal spray 4 mg/0.1 mL SMARTSIG:Both Nares    ? tamoxifen (NOLVADEX) 20 MG tablet Take 1 tablet (20 mg total) by mouth daily.  90 tablet 3  ? venlafaxine XR (EFFEXOR-XR) 37.5 MG 24 hr capsule Take by mouth.    ? ?No current facility-administered medications for this visit.  ? ? ?PHYSICAL EXAMINATION: ?ECOG PERFORMANCE STATUS: 1 - Symptomatic but completely ambulatory ? ?Vitals:  ? 11/20/21 1430  ?BP: (!) 143/7  ?Pulse: 97  ?Resp: 18  ?Temp: 97.8 ?F (36.6 ?C)  ?SpO2: 99%  ? ?Filed Weights  ? 11/20/21 1430  ?Weight: 108 lb 8 oz (49.2 kg)  ? ? ?BREAST: No palpable masses or nodules in either right or left breasts. No palpable axillary  supraclavicular or infraclavicular adenopathy no breast tenderness or nipple discharge. (exam performed in the presence of a chaperone) ? ?LABORATORY DATA:  ?I have reviewed the data as listed ? ?  Latest Ref Rng & Units 11/09/2021  ?  7:02 PM 11/23/2020  ?  9:14 AM 03/09/2014  ?  9:16 AM  ?CMP  ?Glucose 70 - 99 mg/dL 164   287   161    ?BUN 6 - 20 mg/dL _0 ?Creatinine 0.44 - 1.00 mg/dL 0.84   0.78   0.71    ?Sodium 135 - 145 mmol/L 136   134   138    ?Potassium 3.5 - 5.1 mmol/L 3.4   4.4   4.1    ?Chloride 98 - 111 mmol/L 102   99   103    ?CO2 22 - 32 mmol/L _1 ?Calcium 8.9 - 10.3 mg/dL 9.0   8.9   8.7    ?Total Protein 6.5 - 8.1 g/dL 7.2   6.7   6.8    ?Total Bilirubin 0.3 - 1.2 mg/dL 0.2   0.6   1.9    ?Alkaline Phos 38 - 126 U/L 57   55   137    ?AST 15 - 41 U/L 20   21   64    ?ALT 0 - 44 U/L 16   17   44    ? ? ?Lab Results  ?Component Value Date  ? WBC 8.4 11/09/2021  ? HGB 13.2 11/09/2021  ? HCT 40.4 11/09/2021  ? MCV 92.9 11/09/2021  ? PLT 165 11/09/2021  ? NEUTROABS 5.2 11/09/2021  ? ? 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  ?Current treatment: She was on COMET clinical trial, active surveillance, tamoxifen 20 mg daily started 10/29/2017, when she got incarcerated we had to remove her from the trial.  However patient wishes to follow active surveillance off the trial. ?  ?Tamoxifen toxicities: Denies any adverse effects to tamoxifen therapy.  She continued the tamoxifen even while she was incarcerated. ?  ?Chronic pain issues: Follows with a pain management physician. ?  ?Plan: Bilateral mammogram 01/05/2021: Stable DCIS, breast density category B ?We will plan for mammograms to be done every 6 months on the right breast and bilaterally annually. ?   ?Return to clinic in 1 year for follow-up ? ? ? ?No orders of the defined types were placed in this encounter. ? ?The patient has a good understanding of the overall plan. she agrees with it. she will call with any problems that may develop before the next visit here. ?Total time spent:  30 mins including face to face time and time spent for planning, charting and co-ordination of care ? ? Harriette Ohara, MD ?11/20/21 ? ? ? ?

## 2021-11-20 NOTE — Assessment & Plan Note (Signed)
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 However patient wishes to follow active surveillance off the trial.  Tamoxifen toxicities:Denies any side effects to tamoxifen. She does have sweats at night which have been there before as well.  She continued the tamoxifen even while she was incarcerated.  Chronic pain issues: Follows with a pain management physician.  Plan:Bilateral mammogram6/23/2022: Stable DCIS, breast density category B We will plan for mammograms to be done every 6 months on the right breast and bilaterally annually.  Return to clinicin1 year for follow-up 

## 2021-11-20 NOTE — Telephone Encounter (Signed)
.  Called patient to schedule appointment per 5/8 inbasket, patient is aware of date and time.   ?

## 2021-11-22 ENCOUNTER — Telehealth: Payer: Self-pay | Admitting: Hematology and Oncology

## 2021-11-22 NOTE — Telephone Encounter (Signed)
Scheduled appointment per 5/8 los. Left message. ?

## 2021-12-04 ENCOUNTER — Ambulatory Visit: Payer: Medicaid Other | Admitting: Hematology and Oncology

## 2022-01-18 ENCOUNTER — Ambulatory Visit
Admission: RE | Admit: 2022-01-18 | Discharge: 2022-01-18 | Disposition: A | Discharge status: Home / Self Care | Payer: Medicaid Other | Source: Ambulatory Visit | Attending: Hematology and Oncology | Admitting: Hematology and Oncology

## 2022-01-18 DIAGNOSIS — D0511 Intraductal carcinoma in situ of right breast: Secondary | ICD-10-CM

## 2022-05-01 ENCOUNTER — Other Ambulatory Visit: Payer: Self-pay | Admitting: *Deleted

## 2022-05-01 MED ORDER — TAMOXIFEN CITRATE 20 MG PO TABS
20.0000 mg | ORAL_TABLET | Freq: Every day | ORAL | 3 refills | Status: DC
Start: 2022-05-01 — End: 2023-01-14

## 2022-10-23 ENCOUNTER — Ambulatory Visit: Payer: Medicaid Other | Admitting: Podiatry

## 2022-11-22 ENCOUNTER — Inpatient Hospital Stay: Payer: Medicaid Other | Attending: Hematology and Oncology | Admitting: Hematology and Oncology

## 2022-11-22 NOTE — Assessment & Plan Note (Deleted)
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  However patient wishes to follow active surveillance off the trial.   Tamoxifen toxicities: Denies any adverse effects to tamoxifen therapy.  She continued the tamoxifen even while she was incarcerated.   Chronic pain issues: Follows with a pain management physician.   Plan:  Bilateral mammogram 01/18/2022: Stable DCIS 8 to 9 cm, breast density category B We will plan for mammograms to be done every 6 months on the right breast and bilaterally annually.   Return to clinic in 1 year for follow-up

## 2022-12-25 ENCOUNTER — Other Ambulatory Visit: Payer: Self-pay | Admitting: Hematology and Oncology

## 2022-12-25 DIAGNOSIS — Z1231 Encounter for screening mammogram for malignant neoplasm of breast: Secondary | ICD-10-CM

## 2023-01-13 ENCOUNTER — Other Ambulatory Visit: Payer: Self-pay | Admitting: Hematology and Oncology

## 2023-01-14 ENCOUNTER — Telehealth: Payer: Self-pay

## 2023-01-14 NOTE — Telephone Encounter (Signed)
Called Pt regarding tamoxifen rx refill request. Pt has not been seen in office in over a year and needs appt to continue refilling rx. Message sent to scheduling. Rx refilled for 30 day supply Pt verbalized understanding.

## 2023-01-15 ENCOUNTER — Telehealth: Payer: Self-pay | Admitting: Hematology and Oncology

## 2023-01-15 NOTE — Telephone Encounter (Signed)
Scheduled appointment per staff message. Patient is aware of the made appointment. 

## 2023-01-21 ENCOUNTER — Ambulatory Visit
Admission: RE | Admit: 2023-01-21 | Discharge: 2023-01-21 | Disposition: A | Discharge status: Home / Self Care | Payer: Medicaid Other | Source: Ambulatory Visit | Attending: Hematology and Oncology | Admitting: Hematology and Oncology

## 2023-01-21 DIAGNOSIS — Z1231 Encounter for screening mammogram for malignant neoplasm of breast: Secondary | ICD-10-CM

## 2023-01-22 ENCOUNTER — Other Ambulatory Visit: Payer: Self-pay | Admitting: Hematology and Oncology

## 2023-01-22 DIAGNOSIS — Z853 Personal history of malignant neoplasm of breast: Secondary | ICD-10-CM

## 2023-02-06 ENCOUNTER — Encounter (HOSPITAL_COMMUNITY): Payer: Self-pay | Admitting: Emergency Medicine

## 2023-02-06 ENCOUNTER — Other Ambulatory Visit: Payer: Self-pay

## 2023-02-06 ENCOUNTER — Emergency Department (HOSPITAL_COMMUNITY)
Admission: EM | Admit: 2023-02-06 | Discharge: 2023-02-06 | Discharge status: Against Medical Advice | Payer: Medicaid Other | Attending: Emergency Medicine | Admitting: Emergency Medicine

## 2023-02-06 DIAGNOSIS — L089 Local infection of the skin and subcutaneous tissue, unspecified: Secondary | ICD-10-CM | POA: Diagnosis not present

## 2023-02-06 DIAGNOSIS — X58XXXA Exposure to other specified factors, initial encounter: Secondary | ICD-10-CM | POA: Diagnosis not present

## 2023-02-06 DIAGNOSIS — E119 Type 2 diabetes mellitus without complications: Secondary | ICD-10-CM | POA: Insufficient documentation

## 2023-02-06 DIAGNOSIS — Z79899 Other long term (current) drug therapy: Secondary | ICD-10-CM | POA: Diagnosis not present

## 2023-02-06 DIAGNOSIS — Z7984 Long term (current) use of oral hypoglycemic drugs: Secondary | ICD-10-CM | POA: Diagnosis not present

## 2023-02-06 DIAGNOSIS — S90822A Blister (nonthermal), left foot, initial encounter: Secondary | ICD-10-CM | POA: Diagnosis present

## 2023-02-06 MED ORDER — DOXYCYCLINE HYCLATE 100 MG PO TABS
100.0000 mg | ORAL_TABLET | Freq: Once | ORAL | Status: AC
  Administered 2023-02-06: 100 mg via ORAL
  Filled 2023-02-06: qty 1

## 2023-02-06 MED ORDER — LIDOCAINE-EPINEPHRINE (PF) 2 %-1:200000 IJ SOLN
10.0000 mL | Freq: Once | INTRAMUSCULAR | Status: AC
  Administered 2023-02-06: 10 mL via INTRADERMAL
  Filled 2023-02-06: qty 20

## 2023-02-06 MED ORDER — DOXYCYCLINE HYCLATE 100 MG PO CAPS: 100.0000 mg | ORAL_CAPSULE | Freq: Two times a day (BID) | ORAL | 0 refills | Status: DC

## 2023-02-06 NOTE — Discharge Instructions (Signed)
You have been treated for infected blister.  Please take antibiotic as prescribed.  Keep the wound dry and change dressing daily.  Follow-up with your doctor for further evaluation and return if your condition worsen or if you have other concern.

## 2023-02-06 NOTE — ED Notes (Signed)
Pt called for room with no answer. 

## 2023-02-06 NOTE — ED Provider Notes (Signed)
[place] [facility]ERGENCY DEPARTMENT AT [facility] Provider Note   CSN: [id number] Arrival date & time: 02/06/23  1543     History  Chief Complaint  Patient presents with   Blister    Kenzee Bassin is a 62 y.o. female.  The history is provided by the patient and medical records. No language interpreter was used.     62 year old female significant history of diabetes, hepatitis C, polysubstance abuse presenting with complaints of foot pain.  Patient reports she noticed a blister that formed and the back of her left heel on her foot 4 days ago that has become increasingly more tender.  States it is worse when she palpated and with walking.  Does not endorse any fever or chills and does not recall any specific injury.  States she is up-to-date with tetanus.    Home Medications Prior to Admission medications   Medication Sig Start Date End Date Taking? Authorizing Provider  atorvastatin (LIPITOR) 20 MG tablet Take 1 tablet (20 mg total) by mouth daily. 11/15/20   Serena Croissant, MD  Buprenorphine HCl-Naloxone HCl (SUBOXONE) 12-3 MG FILM Place 1 Film under the tongue 2 (two) times daily.     [provider]  [facility] D3 5000 units capsule Take 5,000 Units by mouth daily. 12/06/17   [provider]  gabapentin (NEURONTIN) 100 MG capsule Take 100 mg by mouth 3 (three) times daily. 11/22/20   [provider]  glipiZIDE (GLUCOTROL XL) 5 MG 24 hr tablet Take 1 tablet (5 mg total) by mouth daily with breakfast. 11/06/18   Serena Croissant, MD  levothyroxine (SYNTHROID) 100 MCG tablet Take 1 tablet (100 mcg total) by mouth daily before breakfast. 11/15/20   Serena Croissant, MD  losartan (COZAAR) 50 MG tablet Take 1 tablet (50 mg total) by mouth daily. 11/10/18   Serena Croissant, MD  meloxicam (MOBIC) 15 MG tablet Take 1 tablet by mouth daily. 11/21/20   [provider]  metFORMIN (GLUCOPHAGE) 500 MG tablet Take 1 tablet (500 mg total) by mouth daily with  breakfast. Patient taking differently: Take 1,000 mg by mouth 2 (two) times daily with a meal.  03/02/14   Advani, Ayesha Rumpf, MD  metoprolol tartrate (LOPRESSOR) 50 MG tablet TAKE 1 TABLET BY MOUTH EVERY DAY 11/11/18   Serena Croissant, MD  naloxone [facility]) nasal spray 4 mg/0.1 mL SMARTSIG:Both Nares 11/08/20   [provider]  tamoxifen (NOLVADEX) 20 MG tablet TAKE 1 TABLET BY MOUTH EVERY DAY 01/14/23   Serena Croissant, MD  venlafaxine XR (EFFEXOR-XR) 37.5 MG 24 hr capsule Take by mouth. 11/11/18   [provider]      Allergies    Patient has no known allergies.    Review of Systems   Review of Systems  All other systems reviewed and are negative.   Physical Exam Updated Vital Signs BP 126/71 (BP Location: Left Arm)   Pulse 96   Temp 98.5 F (36.9 C) (Oral)   Resp 14   Ht 5' (1.524 m)   Wt 49 kg   LMP 02/21/2016   SpO2 100%   BMI 21.10 kg/m  Physical Exam Vitals and nursing note reviewed.  Constitutional:      General: She is not in acute distress.    Appearance: She is well-developed.  HENT:     Head: Atraumatic.  Eyes:     Conjunctiva/sclera: Conjunctivae normal.  Pulmonary:     Effort: Pulmonary effort is normal.  Musculoskeletal:  General: Tenderness (Left foot: Large blisters to the heel approximately 4 cm in diameter with tenderness to palpation and surrounding erythema) present.     Cervical back: Neck supple.  Skin:    Findings: No rash.  Neurological:     Mental Status: She is alert.  Psychiatric:        Mood and Affect: Mood normal.     ED Results / Procedures / Treatments   Labs (all labs ordered are listed, but only abnormal results are displayed) Labs Reviewed - No data to display  EKG None  Radiology No results found.  Procedures .Marland KitchenIncision and Drainage  Date/Time: 02/06/2023 7:16 PM  Performed by: Fayrene Helper, PA-C Authorized by: Fayrene Helper, PA-C   Consent:    Consent obtained:  Verbal   Consent given by:  Patient    Risks discussed:  Bleeding, incomplete drainage, pain and damage to other organs   Alternatives discussed:  No treatment Universal protocol:    Procedure explained and questions answered to patient or proxy's satisfaction: yes     Relevant documents present and verified: yes     Test results available : yes     Imaging studies available: yes     Required blood products, implants, devices, and special equipment available: yes     Site/side marked: yes     Immediately prior to procedure, a time out was called: yes     Patient identity confirmed:  Verbally with patient Location:    Type:  Abscess (infected L foot blister)   Size:  4   Location:  Lower extremity   Lower extremity location:  Foot   Foot location:  L foot Pre-procedure details:    Skin preparation:  Betadine Anesthesia:    Anesthesia method:  Local infiltration   Local anesthetic:  Lidocaine 1% WITH epi Procedure type:    Complexity:  Complex Procedure details:    Incision types:  Single straight   Incision depth:  Subcutaneous   Wound management:  Probed and deloculated, irrigated with saline and extensive cleaning (surgical removal of devitalized tissue using sterile scissor)   Drainage:  Serosanguinous   Drainage amount:  Scant   Wound treatment:  Wound left open   Packing materials:  None Post-procedure details:    Procedure completion:  Tolerated well, no immediate complications     Medications Ordered in ED Medications  doxycycline (VIBRA-TABS) tablet 100 mg (100 mg Oral Given 02/06/23 1915)  lidocaine-EPINEPHrine (XYLOCAINE W/EPI) 2 %-1:200000 (PF) injection 10 mL (10 mLs Intradermal Given by Other 02/06/23 1915)    ED Course/ Medical Decision Making/ A&P                             Medical Decision Making  BP 126/71 (BP Location: Left Arm)   Pulse 96   Temp 98.5 F (36.9 C) (Oral)   Resp 14   Ht 5' (1.524 m)   Wt 49 kg   LMP 02/21/2016   SpO2 100%   BMI 21.10 kg/m   46:91 PM  62 year old  female significant history of diabetes, hepatitis C, polysubstance abuse presenting with complaints of foot pain.  Patient reports she noticed a blister that formed and the back of her left heel on her foot 4 days ago that has become increasingly more tender.  States it is worse when she palpated and with walking.  Does not endorse any fever or chills and does not recall any specific injury.  States she is up-to-date with tetanus.  On exam patient's left foot is notable for a 4 cm blister to her posterior heel with surrounding erythema and tenderness to palpation.  No foreign body noted.  No obvious purulent discharge.  Given this presentation, plan to perform incision and drainage and apply appropriate dressing to this wound.  Will initiate antibiotic as well.  7:18 PM I anesthetized the infected blister on the left foot, using sterile scissors I was able to remove the devitalized tissue, irrigate the wound, and apply nonocclusive dressing to the affected area.  Will discharge home with antibiotic and appropriate wound care instruction.  Return precaution given.        Final Clinical Impression(s) / ED Diagnoses Final diagnoses:  Infected blister of left foot, initial encounter    Rx / DC Orders ED Discharge Orders          Ordered    doxycycline (VIBRAMYCIN) 100 MG capsule  2 times daily        02/06/23 1921              Fayrene Helper, PA-C 02/06/23 1922    Lonell Grandchild, MD 02/06/23 2139

## 2023-02-06 NOTE — ED Triage Notes (Signed)
Pt noticed blister to heel of left foot for about 4 days.

## 2023-02-06 NOTE — ED Notes (Signed)
Pt back in WR at this time. Pt stated she had stepped out to warm up.

## 2023-02-11 ENCOUNTER — Inpatient Hospital Stay: Admission: RE | Admit: 2023-02-11 | Payer: Medicaid Other | Source: Ambulatory Visit

## 2023-02-12 ENCOUNTER — Inpatient Hospital Stay: Payer: Medicaid Other | Attending: Hematology and Oncology | Admitting: Hematology and Oncology

## 2023-02-12 NOTE — Assessment & Plan Note (Deleted)
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  However patient wishes to follow active surveillance off the trial.   Tamoxifen toxicities: Denies any adverse effects to tamoxifen therapy.  She continued the tamoxifen even while she was incarcerated.   Chronic pain issues: Follows with a pain management physician.   Plan: Bilateral mammogram 01/18/2022: Stable DCIS 8 to 9 cm, breast density category C We will plan for mammograms to be done every 6 months on the right breast and bilaterally annually.   Return to clinic in 1 year for follow-up

## 2023-02-25 ENCOUNTER — Ambulatory Visit (HOSPITAL_COMMUNITY)
Admission: EM | Admit: 2023-02-25 | Discharge: 2023-02-25 | Disposition: A | Discharge status: Home / Self Care | Payer: Medicaid Other | Attending: Internal Medicine | Admitting: Internal Medicine

## 2023-02-25 ENCOUNTER — Emergency Department (HOSPITAL_COMMUNITY): Payer: Medicaid Other

## 2023-02-25 ENCOUNTER — Encounter (HOSPITAL_COMMUNITY): Payer: Self-pay

## 2023-02-25 ENCOUNTER — Encounter (HOSPITAL_COMMUNITY): Payer: Self-pay | Admitting: Emergency Medicine

## 2023-02-25 ENCOUNTER — Emergency Department (HOSPITAL_COMMUNITY)
Admission: EM | Admit: 2023-02-25 | Discharge: 2023-02-25 | Discharge status: Against Medical Advice | Payer: Medicaid Other | Attending: Emergency Medicine | Admitting: Emergency Medicine

## 2023-02-25 ENCOUNTER — Other Ambulatory Visit: Payer: Self-pay

## 2023-02-25 ENCOUNTER — Inpatient Hospital Stay (HOSPITAL_COMMUNITY)
Admission: EM | Admit: 2023-02-25 | Discharge: 2023-03-07 | DRG: 981 | Disposition: A | Discharge status: Home / Self Care | Payer: Medicaid Other | Attending: Internal Medicine | Admitting: Internal Medicine

## 2023-02-25 DIAGNOSIS — Z5321 Procedure and treatment not carried out due to patient leaving prior to being seen by health care provider: Secondary | ICD-10-CM | POA: Insufficient documentation

## 2023-02-25 DIAGNOSIS — Z79899 Other long term (current) drug therapy: Secondary | ICD-10-CM

## 2023-02-25 DIAGNOSIS — E785 Hyperlipidemia, unspecified: Secondary | ICD-10-CM | POA: Diagnosis present

## 2023-02-25 DIAGNOSIS — L97529 Non-pressure chronic ulcer of other part of left foot with unspecified severity: Secondary | ICD-10-CM | POA: Diagnosis present

## 2023-02-25 DIAGNOSIS — G894 Chronic pain syndrome: Secondary | ICD-10-CM | POA: Diagnosis present

## 2023-02-25 DIAGNOSIS — E11621 Type 2 diabetes mellitus with foot ulcer: Secondary | ICD-10-CM | POA: Diagnosis present

## 2023-02-25 DIAGNOSIS — L97422 Non-pressure chronic ulcer of left heel and midfoot with fat layer exposed: Secondary | ICD-10-CM | POA: Diagnosis present

## 2023-02-25 DIAGNOSIS — F1410 Cocaine abuse, uncomplicated: Secondary | ICD-10-CM | POA: Diagnosis present

## 2023-02-25 DIAGNOSIS — Z803 Family history of malignant neoplasm of breast: Secondary | ICD-10-CM

## 2023-02-25 DIAGNOSIS — K219 Gastro-esophageal reflux disease without esophagitis: Secondary | ICD-10-CM | POA: Diagnosis present

## 2023-02-25 DIAGNOSIS — Z79890 Hormone replacement therapy (postmenopausal): Secondary | ICD-10-CM

## 2023-02-25 DIAGNOSIS — Z91199 Patient's noncompliance with other medical treatment and regimen due to unspecified reason: Secondary | ICD-10-CM

## 2023-02-25 DIAGNOSIS — S90422A Blister (nonthermal), left great toe, initial encounter: Secondary | ICD-10-CM | POA: Insufficient documentation

## 2023-02-25 DIAGNOSIS — F39 Unspecified mood [affective] disorder: Secondary | ICD-10-CM | POA: Diagnosis present

## 2023-02-25 DIAGNOSIS — I10 Essential (primary) hypertension: Secondary | ICD-10-CM | POA: Diagnosis present

## 2023-02-25 DIAGNOSIS — Z853 Personal history of malignant neoplasm of breast: Secondary | ICD-10-CM

## 2023-02-25 DIAGNOSIS — E43 Unspecified severe protein-calorie malnutrition: Secondary | ICD-10-CM | POA: Insufficient documentation

## 2023-02-25 DIAGNOSIS — Z8249 Family history of ischemic heart disease and other diseases of the circulatory system: Secondary | ICD-10-CM

## 2023-02-25 DIAGNOSIS — Z79810 Long term (current) use of selective estrogen receptor modulators (SERMs): Secondary | ICD-10-CM

## 2023-02-25 DIAGNOSIS — Z79891 Long term (current) use of opiate analgesic: Secondary | ICD-10-CM

## 2023-02-25 DIAGNOSIS — F17210 Nicotine dependence, cigarettes, uncomplicated: Secondary | ICD-10-CM | POA: Diagnosis present

## 2023-02-25 DIAGNOSIS — X58XXXA Exposure to other specified factors, initial encounter: Secondary | ICD-10-CM | POA: Insufficient documentation

## 2023-02-25 DIAGNOSIS — E089 Diabetes mellitus due to underlying condition without complications: Secondary | ICD-10-CM

## 2023-02-25 DIAGNOSIS — B182 Chronic viral hepatitis C: Secondary | ICD-10-CM | POA: Diagnosis present

## 2023-02-25 DIAGNOSIS — Z833 Family history of diabetes mellitus: Secondary | ICD-10-CM

## 2023-02-25 DIAGNOSIS — I70222 Atherosclerosis of native arteries of extremities with rest pain, left leg: Secondary | ICD-10-CM | POA: Diagnosis present

## 2023-02-25 DIAGNOSIS — L0390 Cellulitis, unspecified: Secondary | ICD-10-CM | POA: Diagnosis present

## 2023-02-25 DIAGNOSIS — L089 Local infection of the skin and subcutaneous tissue, unspecified: Secondary | ICD-10-CM | POA: Diagnosis not present

## 2023-02-25 DIAGNOSIS — L03116 Cellulitis of left lower limb: Principal | ICD-10-CM | POA: Diagnosis present

## 2023-02-25 DIAGNOSIS — E1165 Type 2 diabetes mellitus with hyperglycemia: Secondary | ICD-10-CM | POA: Diagnosis present

## 2023-02-25 DIAGNOSIS — E871 Hypo-osmolality and hyponatremia: Secondary | ICD-10-CM | POA: Diagnosis present

## 2023-02-25 DIAGNOSIS — E876 Hypokalemia: Secondary | ICD-10-CM | POA: Diagnosis present

## 2023-02-25 DIAGNOSIS — Z681 Body mass index (BMI) 19 or less, adult: Secondary | ICD-10-CM

## 2023-02-25 DIAGNOSIS — M79672 Pain in left foot: Secondary | ICD-10-CM | POA: Insufficient documentation

## 2023-02-25 DIAGNOSIS — Z7984 Long term (current) use of oral hypoglycemic drugs: Secondary | ICD-10-CM

## 2023-02-25 DIAGNOSIS — E039 Hypothyroidism, unspecified: Secondary | ICD-10-CM | POA: Diagnosis present

## 2023-02-25 DIAGNOSIS — E1140 Type 2 diabetes mellitus with diabetic neuropathy, unspecified: Secondary | ICD-10-CM | POA: Diagnosis present

## 2023-02-25 DIAGNOSIS — Z791 Long term (current) use of non-steroidal anti-inflammatories (NSAID): Secondary | ICD-10-CM

## 2023-02-25 DIAGNOSIS — E1151 Type 2 diabetes mellitus with diabetic peripheral angiopathy without gangrene: Secondary | ICD-10-CM | POA: Diagnosis present

## 2023-02-25 DIAGNOSIS — F1110 Opioid abuse, uncomplicated: Secondary | ICD-10-CM | POA: Diagnosis present

## 2023-02-25 LAB — URINALYSIS, ROUTINE W REFLEX MICROSCOPIC
Bilirubin Urine: NEGATIVE
Glucose, UA: >=500 mg/dL — AB
Ketones, ur: NEGATIVE mg/dL
Nitrite: NEGATIVE
Protein, ur: 30 mg/dL — AB
Specific Gravity, Urine: 1.027 (ref 1.005–1.030)
pH: 5 (ref 5.0–8.0)

## 2023-02-25 LAB — I-STAT CG4 LACTIC ACID, ED
Lactic Acid, Venous: 0.5 mmol/L (ref 0.5–1.9)
Lactic Acid, Venous: 0.7 mmol/L (ref 0.5–1.9)
Lactic Acid, Venous: 0.7 mmol/L (ref 0.5–1.9)

## 2023-02-25 LAB — COMPREHENSIVE METABOLIC PANEL
ALT: 23 U/L (ref 0–44)
AST: 28 U/L (ref 15–41)
Albumin: 2.8 g/dL — ABNORMAL LOW (ref 3.5–5.0)
Alkaline Phosphatase: 83 U/L (ref 38–126)
Anion gap: 9 (ref 5–15)
BUN: 7 mg/dL — ABNORMAL LOW (ref 8–23)
CO2: 24 mmol/L (ref 22–32)
Calcium: 8.4 mg/dL — ABNORMAL LOW (ref 8.9–10.3)
Chloride: 99 mmol/L (ref 98–111)
Creatinine, Ser: 0.68 mg/dL (ref 0.44–1.00)
GFR, Estimated: >60 mL/min (ref >60)
Glucose, Bld: 218 mg/dL — ABNORMAL HIGH (ref 70–99)
Potassium: 3 mmol/L — ABNORMAL LOW (ref 3.5–5.1)
Sodium: 132 mmol/L — ABNORMAL LOW (ref 135–145)
Total Bilirubin: 0.7 mg/dL (ref 0.3–1.2)
Total Protein: 6.3 g/dL — ABNORMAL LOW (ref 6.5–8.1)

## 2023-02-25 LAB — BASIC METABOLIC PANEL WITH GFR
Anion gap: 10 (ref 5–15)
BUN: 9 mg/dL (ref 8–23)
CO2: 25 mmol/L (ref 22–32)
Calcium: 8.8 mg/dL — ABNORMAL LOW (ref 8.9–10.3)
Chloride: 99 mmol/L (ref 98–111)
Creatinine, Ser: 0.74 mg/dL (ref 0.44–1.00)
GFR, Estimated: >60 mL/min
Glucose, Bld: 189 mg/dL — ABNORMAL HIGH (ref 70–99)
Potassium: 3 mmol/L — ABNORMAL LOW (ref 3.5–5.1)
Sodium: 134 mmol/L — ABNORMAL LOW (ref 135–145)

## 2023-02-25 LAB — CBC WITH DIFFERENTIAL/PLATELET
Abs Immature Granulocytes: 0.11 10*3/uL — ABNORMAL HIGH (ref 0.00–0.07)
Abs Immature Granulocytes: 0.11 K/uL — ABNORMAL HIGH (ref 0.00–0.07)
Basophils Absolute: 0.1 10*3/uL (ref 0.0–0.1)
Basophils Absolute: 0.1 K/uL (ref 0.0–0.1)
Basophils Relative: 1 %
Basophils Relative: 1 %
Eosinophils Absolute: 0.2 10*3/uL (ref 0.0–0.5)
Eosinophils Absolute: 0.2 K/uL (ref 0.0–0.5)
Eosinophils Relative: 2 %
Eosinophils Relative: 2 %
HCT: 34 % — ABNORMAL LOW (ref 36.0–46.0)
HCT: 35.7 % — ABNORMAL LOW (ref 36.0–46.0)
Hemoglobin: 11.4 g/dL — ABNORMAL LOW (ref 12.0–15.0)
Hemoglobin: 12 g/dL (ref 12.0–15.0)
Immature Granulocytes: 1 %
Immature Granulocytes: 1 %
Lymphocytes Relative: 26 %
Lymphocytes Relative: 26 %
Lymphs Abs: 3 10*3/uL (ref 0.7–4.0)
Lymphs Abs: 2.9 K/uL (ref 0.7–4.0)
MCH: 28.6 pg (ref 26.0–34.0)
MCH: 29.3 pg (ref 26.0–34.0)
MCHC: 33.5 g/dL (ref 30.0–36.0)
MCHC: 33.6 g/dL (ref 30.0–36.0)
MCV: 85.4 fL (ref 80.0–100.0)
MCV: 87.3 fL (ref 80.0–100.0)
Monocytes Absolute: 1 10*3/uL (ref 0.1–1.0)
Monocytes Absolute: 1 K/uL (ref 0.1–1.0)
Monocytes Relative: 9 %
Monocytes Relative: 9 %
Neutro Abs: 7 10*3/uL (ref 1.7–7.7)
Neutro Abs: 6.9 K/uL (ref 1.7–7.7)
Neutrophils Relative %: 61 %
Neutrophils Relative %: 61 %
Platelets: 200 10*3/uL (ref 150–400)
Platelets: 219 K/uL (ref 150–400)
RBC: 3.98 MIL/uL (ref 3.87–5.11)
RBC: 4.09 MIL/uL (ref 3.87–5.11)
RDW: 12.5 % (ref 11.5–15.5)
RDW: 12.4 % (ref 11.5–15.5)
WBC: 11.4 10*3/uL — ABNORMAL HIGH (ref 4.0–10.5)
WBC: 11.2 K/uL — ABNORMAL HIGH (ref 4.0–10.5)
nRBC: 0 % (ref 0.0–0.2)
nRBC: 0 % (ref 0.0–0.2)

## 2023-02-25 LAB — CBG MONITORING, ED
CBG: 222
Glucose-Capillary: 201 mg/dL — ABNORMAL HIGH (ref 70–99)

## 2023-02-25 MED ORDER — IOHEXOL 300 MG/ML  SOLN
80.0000 mL | Freq: Once | INTRAMUSCULAR | Status: AC | PRN
  Administered 2023-02-25: 75 mL via INTRAVENOUS

## 2023-02-25 MED ORDER — POTASSIUM CHLORIDE CRYS ER 20 MEQ PO TBCR
40.0000 meq | EXTENDED_RELEASE_TABLET | Freq: Once | ORAL | Status: AC
  Administered 2023-02-25: 40 meq via ORAL
  Filled 2023-02-25: qty 2

## 2023-02-25 MED ORDER — MORPHINE SULFATE (PF) 4 MG/ML IV SOLN
4.0000 mg | Freq: Once | INTRAVENOUS | Status: AC
  Administered 2023-02-25: 4 mg via INTRAVENOUS
  Filled 2023-02-25: qty 1

## 2023-02-25 NOTE — ED Provider Notes (Incomplete)
[place] EMERGENCY DEPARTMENT AT [facility] Provider Note   CSN: [id number] Arrival date & time: 02/25/23  1946     History {Add pertinent medical, surgical, social history, OB history to HPI:1} Chief Complaint  Patient presents with  . Foot Pain    Martha Lozano is a 62 y.o. female.  The history is provided by the patient.  Foot Pain  She has history of diabetes, hepatitis C, substance abuse and comes in because of concern of infection in her left foot.  She has a blister on her left great toe, and an ulcer on her heel.  She was seen at an emergency department about 3 weeks ago and prescribed doxycycline.  She noted no improvement and went to an urgent care center and was given a different antibiotic (she is not sure what the second antibiotic was) and continues to see no improvement.  She is noticing increased pain in her left foot.  She denies fever or chills.   Home Medications Prior to Admission medications   Medication Sig Start Date End Date Taking? Authorizing Provider  atorvastatin (LIPITOR) 20 MG tablet Take 1 tablet (20 mg total) by mouth daily. Patient not taking: Reported on 02/25/2023 11/15/20   Serena Croissant, MD  Buprenorphine HCl-Naloxone HCl (SUBOXONE) 12-3 MG FILM Place 1 Film under the tongue 2 (two) times daily.     [provider]  CVS D3 5000 units capsule Take 5,000 Units by mouth daily. 12/06/17   [provider]  doxycycline (VIBRAMYCIN) 100 MG capsule Take 1 capsule (100 mg total) by mouth 2 (two) times daily. 02/06/23   Fayrene Helper, PA-C  gabapentin (NEURONTIN) 100 MG capsule Take 100 mg by mouth 3 (three) times daily. 11/22/20   [provider]  glipiZIDE (GLUCOTROL XL) 5 MG 24 hr tablet Take 1 tablet (5 mg total) by mouth daily with breakfast. 11/06/18   Serena Croissant, MD  levothyroxine (SYNTHROID) 100 MCG tablet Take 1 tablet (100 mcg total) by mouth daily before breakfast. 11/15/20   Serena Croissant, MD  losartan  (COZAAR) 50 MG tablet Take 1 tablet (50 mg total) by mouth daily. 11/10/18   Serena Croissant, MD  meloxicam (MOBIC) 15 MG tablet Take 1 tablet by mouth daily. 11/21/20   [provider]  metFORMIN (GLUCOPHAGE) 500 MG tablet Take 1 tablet (500 mg total) by mouth daily with breakfast. Patient taking differently: Take 1,000 mg by mouth 2 (two) times daily with a meal.  03/02/14   Advani, Ayesha Rumpf, MD  metoprolol tartrate (LOPRESSOR) 50 MG tablet TAKE 1 TABLET BY MOUTH EVERY DAY 11/11/18   Serena Croissant, MD  naloxone [facility]) nasal spray 4 mg/0.1 mL SMARTSIG:Both Nares 11/08/20   [provider]  tamoxifen (NOLVADEX) 20 MG tablet TAKE 1 TABLET BY MOUTH EVERY DAY 01/14/23   Serena Croissant, MD  venlafaxine XR (EFFEXOR-XR) 37.5 MG 24 hr capsule Take by mouth. 11/11/18   [provider]      Allergies    Patient has no known allergies.    Review of Systems   Review of Systems  All other systems reviewed and are negative.   Physical Exam Updated Vital Signs BP 128/77 (BP Location: Left Arm)   Pulse 98   Temp 97.8 F (36.6 C) (Oral)   Resp 18   LMP 02/21/2016   SpO2 96%  Physical Exam Vitals and nursing note reviewed.   62 year old female, resting comfortably and in no acute distress. Vital signs are normal. Oxygen  saturation is 96%, which is normal. Head is normocephalic and atraumatic. PERRLA, EOMI. Oropharynx is clear. Neck is nontender and supple without adenopathy. Lungs are clear without rales, wheezes, or rhonchi. Chest is nontender. Heart has regular rate and rhythm without murmur. Abdomen is soft, flat, nontender. Extremities: There is erythema and swelling over the left foot with a large bulla noted over the plantar and medial surfaces of the left great toe.  There is an ulcer over the heel with a necrotic center.  Sensation is normal and capillary refill is prompt.  Remainder of extremity exam is unremarkable. Skin is warm and dry without rash. Neurologic: Mental  status is normal, cranial nerves are intact, there are no motor or sensory deficits.  Constant choreiform movements noted.                ED Results / Procedures / Treatments   Labs (all labs ordered are listed, but only abnormal results are displayed) Labs Reviewed  CBC WITH DIFFERENTIAL/PLATELET - Abnormal; Notable for the following components:      Result Value   WBC 11.2 (*)    HCT 35.7 (*)    Abs Immature Granulocytes 0.11 (*)    All other components within normal limits  BASIC METABOLIC PANEL - Abnormal; Notable for the following components:   Sodium 134 (*)    Potassium 3.0 (*)    Glucose, Bld 189 (*)    Calcium 8.8 (*)    All other components within normal limits  CBG MONITORING, ED - Abnormal; Notable for the following components:   Glucose-Capillary 201 (*)    All other components within normal limits  C-REACTIVE PROTEIN  SEDIMENTATION RATE  I-STAT CG4 LACTIC ACID, ED  I-STAT CG4 LACTIC ACID, ED    EKG None  Radiology DG Foot Complete Left  Result Date: 02/25/2023 CLINICAL DATA:  Infection. EXAM: LEFT FOOT - COMPLETE 3+ VIEW COMPARISON:  None Available. FINDINGS: Minimal irregularity of the tip of the distal phalanx of the great toe may represent osteomyelitis. Clinical correlation is recommended. There is no acute fracture or dislocation. No significant arthritic changes. There is soft tissue swelling of the forefoot and great toe. No radiopaque foreign object or soft tissue gas. IMPRESSION: 1. No acute fracture or dislocation. 2. Possible osteomyelitis of the distal phalanx of the great toe. Clinical correlation is recommended. Electronically Signed   By: Elgie Collard M.D.   On: 02/25/2023 21:35   DG Chest 2 View  Result Date: 02/25/2023 CLINICAL DATA:  Chest pain.  Chest EXAM: CHEST - 2 VIEW COMPARISON:  [place] dated 12/23/2012. FINDINGS: The lungs are clear. There is no pleural effusion or pneumothorax. The cardiac silhouette is within normal  limits. No acute osseous pathology. IMPRESSION: No active cardiopulmonary disease. Electronically Signed   By: Elgie Collard M.D.   On: 02/25/2023 16:42    Procedures Procedures  {Document cardiac monitor, telemetry assessment procedure when appropriate:1}  Medications Ordered in ED Medications  morphine (PF) 4 MG/ML injection 4 mg (has no administration in time range)    ED Course/ Medical Decision Making/ A&P   {   Click here for ABCD2, HEART and other calculatorsREFRESH Note before signing :1}                              Medical Decision Making Amount and/or Complexity of Data Reviewed Labs: ordered. Radiology: ordered.  Risk Prescription drug management.   Erythema, pain, swelling, ulcer  of the left foot concerning for osteomyelitis.  She has failed to respond to 2 courses of antibiotics.  Chest x-ray shows no active cardiopulmonary disease, x-rays of the left foot show possible osteomyelitis of the distal phalanx of the great toe.  I have independently viewed the images, and agree with the radiologist's interpretation.  I have reviewed and interpreted her laboratory tests, and my interpretation is mild hyponatremia which is not felt to be clinically significant, hypokalemia and I have ordered a dose of oral potassium, normal renal function, mild leukocytosis with no left shift, normal hemoglobin.  Increase in absolute immature granulocytes is noted consistent with active infection.  Lactic acid level is normal.  I am ordering additional laboratory tests evident erythrocyte sedimentation rate and procalcitonin.  I am ordering a CT scan of the left foot with contrast to look for signs of osteomyelitis.  I am ordering morphine for pain.  She has failed outpatient management, will need to be admitted.  {Document critical care time when appropriate:1} {Document review of labs and clinical decision tools ie heart score, Chads2Vasc2 etc:1}  {Document your independent review of  radiology images, and any outside records:1} {Document your discussion with family members, caretakers, and with consultants:1} {Document social determinants of health affecting pt's care:1} {Document your decision making why or why not admission, treatments were needed:1} Final Clinical Impression(s) / ED Diagnoses Final diagnoses:  None    Rx / DC Orders ED Discharge Orders     None

## 2023-02-25 NOTE — ED Provider Notes (Signed)
[place] EMERGENCY DEPARTMENT AT [facility] Provider Note   CSN: [id number] Arrival date & time: 02/25/23  1946     History  Chief Complaint  Patient presents with   Foot Pain    Martha Lozano is a 62 y.o. female.  The history is provided by the patient.  Foot Pain  She has history of diabetes, hepatitis C, substance abuse and comes in because of concern of infection in her left foot.  She has a blister on her left great toe, and an ulcer on her heel.  She was seen at an emergency department about 3 weeks ago and prescribed doxycycline.  She noted no improvement and went to an urgent care center and was given a different antibiotic (she is not sure what the second antibiotic was) and continues to see no improvement.  She is noticing increased pain in her left foot.  She denies fever or chills.   Home Medications Prior to Admission medications   Medication Sig Start Date End Date Taking? Authorizing Provider  atorvastatin (LIPITOR) 20 MG tablet Take 1 tablet (20 mg total) by mouth daily. Patient not taking: Reported on 02/25/2023 11/15/20   Serena Croissant, MD  Buprenorphine HCl-Naloxone HCl (SUBOXONE) 12-3 MG FILM Place 1 Film under the tongue 2 (two) times daily.     [provider]  [employer] D3 5000 units capsule Take 5,000 Units by mouth daily. 12/06/17   [provider]  doxycycline (VIBRAMYCIN) 100 MG capsule Take 1 capsule (100 mg total) by mouth 2 (two) times daily. 02/06/23   Fayrene Helper, PA-C  gabapentin (NEURONTIN) 100 MG capsule Take 100 mg by mouth 3 (three) times daily. 11/22/20   [provider]  glipiZIDE (GLUCOTROL XL) 5 MG 24 hr tablet Take 1 tablet (5 mg total) by mouth daily with breakfast. 11/06/18   Serena Croissant, MD  levothyroxine (SYNTHROID) 100 MCG tablet Take 1 tablet (100 mcg total) by mouth daily before breakfast. 11/15/20   Serena Croissant, MD  losartan (COZAAR) 50 MG tablet Take 1 tablet (50 mg total) by mouth daily. 11/10/18    Serena Croissant, MD  meloxicam (MOBIC) 15 MG tablet Take 1 tablet by mouth daily. 11/21/20   [provider]  metFORMIN (GLUCOPHAGE) 500 MG tablet Take 1 tablet (500 mg total) by mouth daily with breakfast. Patient taking differently: Take 1,000 mg by mouth 2 (two) times daily with a meal.  03/02/14   Advani, Ayesha Rumpf, MD  metoprolol tartrate (LOPRESSOR) 50 MG tablet TAKE 1 TABLET BY MOUTH EVERY DAY 11/11/18   Serena Croissant, MD  naloxone [facility]) nasal spray 4 mg/0.1 mL SMARTSIG:Both Nares 11/08/20   [provider]  tamoxifen (NOLVADEX) 20 MG tablet TAKE 1 TABLET BY MOUTH EVERY DAY 01/14/23   Serena Croissant, MD  venlafaxine XR (EFFEXOR-XR) 37.5 MG 24 hr capsule Take by mouth. 11/11/18   [provider]      Allergies    Patient has no known allergies.    Review of Systems   Review of Systems  All other systems reviewed and are negative.   Physical Exam Updated Vital Signs BP 128/77 (BP Location: Left Arm)   Pulse 98   Temp 97.8 F (36.6 C) (Oral)   Resp 18   LMP 02/21/2016   SpO2 96%  Physical Exam Vitals and nursing note reviewed.   62 year old female, resting comfortably and in no acute distress. Vital signs are normal. Oxygen saturation is 96%, which is normal. Head is normocephalic  and atraumatic. PERRLA, EOMI. Oropharynx is clear. Neck is nontender and supple without adenopathy. Lungs are clear without rales, wheezes, or rhonchi. Chest is nontender. Heart has regular rate and rhythm without murmur. Abdomen is soft, flat, nontender. Extremities: There is erythema and swelling over the left foot with a large bulla noted over the plantar and medial surfaces of the left great toe.  There is an ulcer over the heel with a necrotic center.  Sensation is normal and capillary refill is prompt.  Remainder of extremity exam is unremarkable. Skin is warm and dry without rash. Neurologic: Mental status is normal, cranial nerves are intact, there are no motor or sensory  deficits.  Constant choreiform movements noted.                ED Results / Procedures / Treatments   Labs (all labs ordered are listed, but only abnormal results are displayed) Labs Reviewed  CBC WITH DIFFERENTIAL/PLATELET - Abnormal; Notable for the following components:      Result Value   WBC 11.2 (*)    HCT 35.7 (*)    Abs Immature Granulocytes 0.11 (*)    All other components within normal limits  BASIC METABOLIC PANEL - Abnormal; Notable for the following components:   Sodium 134 (*)    Potassium 3.0 (*)    Glucose, Bld 189 (*)    Calcium 8.8 (*)    All other components within normal limits  CBG MONITORING, ED - Abnormal; Notable for the following components:   Glucose-Capillary 201 (*)    All other components within normal limits  C-REACTIVE PROTEIN  SEDIMENTATION RATE  I-STAT CG4 LACTIC ACID, ED  I-STAT CG4 LACTIC ACID, ED   Radiology DG Foot Complete Left  Result Date: 02/25/2023 CLINICAL DATA:  Infection. EXAM: LEFT FOOT - COMPLETE 3+ VIEW COMPARISON:  None Available. FINDINGS: Minimal irregularity of the tip of the distal phalanx of the great toe may represent osteomyelitis. Clinical correlation is recommended. There is no acute fracture or dislocation. No significant arthritic changes. There is soft tissue swelling of the forefoot and great toe. No radiopaque foreign object or soft tissue gas. IMPRESSION: 1. No acute fracture or dislocation. 2. Possible osteomyelitis of the distal phalanx of the great toe. Clinical correlation is recommended. Electronically Signed   By: Elgie Collard M.D.   On: 02/25/2023 21:35   DG Chest 2 View  Result Date: 02/25/2023 CLINICAL DATA:  Chest pain.  Chest EXAM: CHEST - 2 VIEW COMPARISON:  [place] dated 12/23/2012. FINDINGS: The lungs are clear. There is no pleural effusion or pneumothorax. The cardiac silhouette is within normal limits. No acute osseous pathology. IMPRESSION: No active cardiopulmonary disease.  Electronically Signed   By: Elgie Collard M.D.   On: 02/25/2023 16:42    Procedures Procedures    Medications Ordered in ED Medications  vancomycin (VANCOREADY) IVPB 500 mg/100 mL (has no administration in time range)  morphine (PF) 4 MG/ML injection 4 mg (4 mg Intravenous Given 02/25/23 2332)  potassium chloride SA (KLOR-CON M) CR tablet 40 mEq (40 mEq Oral Given 02/25/23 2324)  iohexol (OMNIPAQUE) 300 MG/ML solution 80 mL (75 mLs Intravenous Contrast Given 02/25/23 2336)  vancomycin (VANCOCIN) IVPB 1000 mg/200 mL premix (1,000 mg Intravenous New Bag/Given 02/26/23 0136)    ED Course/ Medical Decision Making/ A&P  Medical Decision Making Amount and/or Complexity of Data Reviewed Labs: ordered. Radiology: ordered.  Risk Prescription drug management. Decision regarding hospitalization.   Erythema, pain, swelling, ulcer of the left foot concerning for osteomyelitis.  She has failed to respond to 2 courses of antibiotics.  Chest x-ray shows no active cardiopulmonary disease, x-rays of the left foot show possible osteomyelitis of the distal phalanx of the great toe.  I have independently viewed the images, and agree with the radiologist's interpretation.  I have reviewed and interpreted her laboratory tests, and my interpretation is mild hyponatremia which is not felt to be clinically significant, hypokalemia and I have ordered a dose of oral potassium, normal renal function, mild leukocytosis with no left shift, normal hemoglobin.  Increase in absolute immature granulocytes is noted consistent with active infection.  Lactic acid level is normal.  I am ordering additional laboratory tests evident erythrocyte sedimentation rate and procalcitonin.  I am ordering a CT scan of the left foot with contrast to look for signs of osteomyelitis.  I am ordering morphine for pain.  She has failed outpatient management, will need to be admitted.  Sedimentation rate is  moderately elevated at 40, CRP is still pending.  CT scan showed possible fluid collection by the big toe but no clear evidence of osteomyelitis.  He may need MRI to make a definitive diagnosis.  I have ordered a dose of vancomycin.  I discussed the case with Dr. Emmit Pomfret of [facility], who agrees to admit the patient.  Final Clinical Impression(s) / ED Diagnoses Final diagnoses:  Cellulitis of left foot  Skin ulcer of left heel with fat layer exposed (HCC)  Hyponatremia  Hypokalemia    Rx / DC Orders ED Discharge Orders     None         Dione Booze, MD 02/26/23 [phone]

## 2023-02-25 NOTE — ED Triage Notes (Signed)
Patient here today with c/o lt foot pain X 2 months after developing a small wound on the bottom of her foot. States that she has a blister under her Great toe on the Lt foot & went to [facility] 2-3 weeks ago & was given ABT for 10 days. She noted this morning her whole foot was swollen & red. A/Ox4, rates her pain 10/10, Hx of Diabetes.

## 2023-02-25 NOTE — ED Triage Notes (Signed)
Pt reports having 2 blisters on foot that have gotten worse. Pt is diabetic & has neuropathy.

## 2023-02-25 NOTE — ED Triage Notes (Signed)
Pt came in via POV d/t Lt foot pain that was noticed almost 2 months ago. States that she has a blister under her Great toe on the Lt foot & went to [facility] & was given ABT then she noted this morning her whole foot was swollen & red. A/Ox4, rates her pain 10/10, Hx of Diabetes.

## 2023-02-25 NOTE — ED Provider Notes (Addendum)
[facility]    CSN: [id number] Arrival date & time: 02/25/23  1817      History   Chief Complaint Chief Complaint  Patient presents with   Foot Pain    HPI Martha Lozano is a 62 y.o. female. Pt reports worsening foot infection. Was seen in urgent care in [place] and given doxycycline for foot infection. Today infection is much worse, more painful. Was in ED for several hours, was tired of waiting so came here.   FYI pt has large pair of scissors in her bag   Foot Pain    Past Medical History:  Diagnosis Date   Breast cancer, stage 0, right    pt currently taking Tamoxifen   Diabetes mellitus without complication (HCC)    Family history of breast cancer    Family history of prostate cancer    Family history of skin cancer    Hep C w/o coma, chronic (HCC)    Hx of hepatitis C    pt received treatment for Hep C in 2018   Sciatic pain     Patient Active Problem List   Diagnosis Date Noted   Genetic testing 11/08/2017   Family history of prostate cancer    Family history of breast cancer    Family history of skin cancer    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   Polysubstance abuse (HCC) 03/02/2014   Elevated BP 03/02/2014   History of hepatitis C 03/02/2014   Abnormal LFTs 03/02/2014   Smoking 03/02/2014   Thrombocytopenia, unspecified (HCC) 03/02/2014   Paresthesia of both feet 03/02/2014   Diabetes mellitus due to underlying condition without complications (HCC) 03/02/2014    Past Surgical History:  Procedure Laterality Date   BREAST BIOPSY Right 09/12/2017   INCISION AND DRAINAGE ABSCESS Left    hand    OB History     Gravida  2   Para  1   Term  1   Preterm      AB  1   Living         SAB      IAB  1   Ectopic      Multiple      Live Births               Home Medications    Prior to Admission medications   Medication Sig Start Date End Date Taking? Authorizing Provider   atorvastatin (LIPITOR) 20 MG tablet Take 1 tablet (20 mg total) by mouth daily. Patient not taking: Reported on 02/25/2023 11/15/20   Serena Croissant, MD  Buprenorphine HCl-Naloxone HCl (SUBOXONE) 12-3 MG FILM Place 1 Film under the tongue 2 (two) times daily.     [provider]  CVS D3 5000 units capsule Take 5,000 Units by mouth daily. 12/06/17   [provider]  doxycycline (VIBRAMYCIN) 100 MG capsule Take 1 capsule (100 mg total) by mouth 2 (two) times daily. 02/06/23   Fayrene Helper, PA-C  gabapentin (NEURONTIN) 100 MG capsule Take 100 mg by mouth 3 (three) times daily. 11/22/20   [provider]  glipiZIDE (GLUCOTROL XL) 5 MG 24 hr tablet Take 1 tablet (5 mg total) by mouth daily with breakfast. 11/06/18   Serena Croissant, MD  levothyroxine (SYNTHROID) 100 MCG tablet Take 1 tablet (100 mcg total) by mouth daily before breakfast. 11/15/20   Serena Croissant, MD  losartan (COZAAR) 50 MG tablet Take 1 tablet (50 mg total) by mouth daily.  11/10/18   Serena Croissant, MD  meloxicam (MOBIC) 15 MG tablet Take 1 tablet by mouth daily. 11/21/20   [provider]  metFORMIN (GLUCOPHAGE) 500 MG tablet Take 1 tablet (500 mg total) by mouth daily with breakfast. Patient taking differently: Take 1,000 mg by mouth 2 (two) times daily with a meal.  03/02/14   Advani, Ayesha Rumpf, MD  metoprolol tartrate (LOPRESSOR) 50 MG tablet TAKE 1 TABLET BY MOUTH EVERY DAY 11/11/18   Serena Croissant, MD  naloxone [facility]) nasal spray 4 mg/0.1 mL SMARTSIG:Both Nares 11/08/20   [provider]  tamoxifen (NOLVADEX) 20 MG tablet TAKE 1 TABLET BY MOUTH EVERY DAY 01/14/23   Serena Croissant, MD  venlafaxine XR (EFFEXOR-XR) 37.5 MG 24 hr capsule Take by mouth. 11/11/18   [provider]    Family History Family History  Problem Relation Age of Onset   Diabetes Sister    Hypertension Sister    Breast cancer Sister 65   Diabetes Brother    Breast cancer Maternal Grandmother 61   Cervical cancer Mother     Prostate cancer Father 73       metastatic to bones   Cancer Maternal Aunt        type unk dx <50   Skin cancer Paternal Uncle    Pulmonary embolism Maternal Grandfather 105   Cancer Paternal Uncle    Breast cancer Other    Stomach cancer Other     Social History Social History   Tobacco Use   Smoking status: Every Day    Current packs/day: 0.33    Average packs/day: 0.3 packs/day for 35.0 years (11.6 ttl pk-yrs)    Types: Cigarettes   Smokeless tobacco: Never  Substance Use Topics   Alcohol use: Yes    Comment: Occasional   Drug use: Not Currently    Comment: history substance abuse      Allergies   Patient has no known allergies.   Review of Systems Review of Systems   Physical Exam Triage Vital Signs ED Triage Vitals [02/25/23 1906]  Encounter Vitals Group     BP 134/81     Systolic BP Percentile      Diastolic BP Percentile      Pulse Rate (!) 101     Resp 16     Temp 98.6 F (37 C)     Temp Source Oral     SpO2 95 %     Weight 85 lb (38.6 kg)     Height 5' (1.524 m)     Head Circumference      Peak Flow      Pain Score 10     Pain Loc      Pain Education      Exclude from Growth Chart    No data found.  Updated Vital Signs BP 134/81 (BP Location: Left Arm)   Pulse (!) 101   Temp 98.6 F (37 C) (Oral)   Resp 16   Ht 5' (1.524 m)   Wt 85 lb (38.6 kg)   LMP 02/21/2016   SpO2 95%   BMI 16.60 kg/m   Visual Acuity Right Eye Distance:   Left Eye Distance:   Bilateral Distance:    Right Eye Near:   Left Eye Near:    Bilateral Near:     Physical Exam Pulmonary:     Effort: Pulmonary effort is normal.  Musculoskeletal:       Feet:     Comments: Very spastic and  uncontrolled in limb movements  Neurological:     Mental Status: She is alert.      UC Treatments / Results  Labs (all labs ordered are listed, but only abnormal results are displayed) Labs Reviewed  CBG MONITORING, ED     EKG   Radiology  Procedures Procedures (including critical care time)  Medications Ordered in UC Medications - No data to display  Initial Impression / Assessment and Plan / UC Course  I have reviewed the triage vital signs and the nursing notes.  Pertinent labs & imaging results that were available during my care of the patient were reviewed by me and considered in my medical decision making (see chart for details).  Clinical Course as of 02/25/23 1928  Mon Feb 25, 2023  1917 POC CBG, ED [AK]    Clinical Course User Index [AK] Cathlyn Parsons, NP   Pt with worsening L great toe/foot infection. I cannot provide appropriate care here in urgent care, directed to seek care in ED.   Final Clinical Impressions(s) / UC Diagnoses   Final diagnoses:  Left foot infection     Discharge Instructions      Please seek care in the ER   ED Prescriptions   None    PDMP not reviewed this encounter.   Cathlyn Parsons, NP 02/25/23 1934    Cathlyn Parsons, NP 02/25/23 1939

## 2023-02-25 NOTE — ED Notes (Signed)
Patient is being discharged from the Urgent Care and sent to the Emergency Department via self . Per provider, patient is in need of higher level of care due to worsening foot infection. Patient is aware and verbalizes understanding of plan of care.  Vitals:   02/25/23 1906  BP: 134/81  Pulse: (!) 101  Resp: 16  Temp: 98.6 F (37 C)  SpO2: 95%

## 2023-02-25 NOTE — ED Provider Triage Note (Signed)
Emergency Medicine Provider Triage Evaluation Note  Martha Lozano , a 62 y.o. female  was evaluated in triage.  Pt complains of pain and swelling to her great toe. Seen in Ed for blister to heel. Sent home on abx, completed abd however now having worsening redness to great toe into top of foot. No fever, weakness  Review of Systems  Positive: Foot swelling, redness. Negative:   Physical Exam  BP (!) 145/89   Pulse (!) 101   Temp 98.4 F (36.9 C) (Oral)   Resp 16   LMP 02/21/2016   SpO2 100%  Gen:   Awake, no distress   Resp:  Normal effort  MSK:   Moves extremities without difficulty, erythema to left great toe and into distal foot, lesion to heel to left foot Other:    Medical Decision Making  Medically screening exam initiated at 9:25 PM.  Appropriate orders placed.  Martha Lozano was informed that the remainder of the evaluation will be completed by another provider, this initial triage assessment does not replace that evaluation, and the importance of remaining in the ED until their evaluation is complete.  Foot infection   Markeis Allman A, PA-C 02/25/23 2126

## 2023-02-25 NOTE — Discharge Instructions (Signed)
Please seek care in the ER

## 2023-02-26 ENCOUNTER — Encounter (HOSPITAL_COMMUNITY): Payer: Self-pay | Admitting: Family Medicine

## 2023-02-26 ENCOUNTER — Observation Stay (HOSPITAL_COMMUNITY): Payer: Medicaid Other

## 2023-02-26 DIAGNOSIS — M79672 Pain in left foot: Secondary | ICD-10-CM | POA: Diagnosis present

## 2023-02-26 DIAGNOSIS — F1110 Opioid abuse, uncomplicated: Secondary | ICD-10-CM | POA: Diagnosis present

## 2023-02-26 DIAGNOSIS — I70222 Atherosclerosis of native arteries of extremities with rest pain, left leg: Secondary | ICD-10-CM | POA: Diagnosis present

## 2023-02-26 DIAGNOSIS — E1151 Type 2 diabetes mellitus with diabetic peripheral angiopathy without gangrene: Secondary | ICD-10-CM | POA: Diagnosis present

## 2023-02-26 DIAGNOSIS — E1140 Type 2 diabetes mellitus with diabetic neuropathy, unspecified: Secondary | ICD-10-CM | POA: Diagnosis present

## 2023-02-26 DIAGNOSIS — Z681 Body mass index (BMI) 19 or less, adult: Secondary | ICD-10-CM | POA: Diagnosis not present

## 2023-02-26 DIAGNOSIS — L03116 Cellulitis of left lower limb: Secondary | ICD-10-CM | POA: Diagnosis present

## 2023-02-26 DIAGNOSIS — E876 Hypokalemia: Secondary | ICD-10-CM | POA: Diagnosis present

## 2023-02-26 DIAGNOSIS — E11621 Type 2 diabetes mellitus with foot ulcer: Secondary | ICD-10-CM | POA: Diagnosis present

## 2023-02-26 DIAGNOSIS — Z79890 Hormone replacement therapy (postmenopausal): Secondary | ICD-10-CM | POA: Diagnosis not present

## 2023-02-26 DIAGNOSIS — K219 Gastro-esophageal reflux disease without esophagitis: Secondary | ICD-10-CM | POA: Diagnosis present

## 2023-02-26 DIAGNOSIS — L0390 Cellulitis, unspecified: Secondary | ICD-10-CM | POA: Diagnosis not present

## 2023-02-26 DIAGNOSIS — B182 Chronic viral hepatitis C: Secondary | ICD-10-CM | POA: Diagnosis present

## 2023-02-26 DIAGNOSIS — I70245 Atherosclerosis of native arteries of left leg with ulceration of other part of foot: Secondary | ICD-10-CM | POA: Diagnosis not present

## 2023-02-26 DIAGNOSIS — I10 Essential (primary) hypertension: Secondary | ICD-10-CM | POA: Diagnosis present

## 2023-02-26 DIAGNOSIS — E089 Diabetes mellitus due to underlying condition without complications: Secondary | ICD-10-CM | POA: Diagnosis not present

## 2023-02-26 DIAGNOSIS — E1165 Type 2 diabetes mellitus with hyperglycemia: Secondary | ICD-10-CM | POA: Diagnosis present

## 2023-02-26 DIAGNOSIS — L97422 Non-pressure chronic ulcer of left heel and midfoot with fat layer exposed: Secondary | ICD-10-CM | POA: Diagnosis present

## 2023-02-26 DIAGNOSIS — E43 Unspecified severe protein-calorie malnutrition: Secondary | ICD-10-CM | POA: Diagnosis present

## 2023-02-26 DIAGNOSIS — G894 Chronic pain syndrome: Secondary | ICD-10-CM | POA: Diagnosis present

## 2023-02-26 DIAGNOSIS — D696 Thrombocytopenia, unspecified: Secondary | ICD-10-CM | POA: Diagnosis not present

## 2023-02-26 DIAGNOSIS — Z7984 Long term (current) use of oral hypoglycemic drugs: Secondary | ICD-10-CM | POA: Diagnosis not present

## 2023-02-26 DIAGNOSIS — F17210 Nicotine dependence, cigarettes, uncomplicated: Secondary | ICD-10-CM | POA: Diagnosis present

## 2023-02-26 DIAGNOSIS — E871 Hypo-osmolality and hyponatremia: Secondary | ICD-10-CM | POA: Diagnosis present

## 2023-02-26 DIAGNOSIS — F39 Unspecified mood [affective] disorder: Secondary | ICD-10-CM | POA: Diagnosis present

## 2023-02-26 DIAGNOSIS — L97529 Non-pressure chronic ulcer of other part of left foot with unspecified severity: Secondary | ICD-10-CM | POA: Diagnosis present

## 2023-02-26 DIAGNOSIS — F1410 Cocaine abuse, uncomplicated: Secondary | ICD-10-CM | POA: Diagnosis present

## 2023-02-26 DIAGNOSIS — E785 Hyperlipidemia, unspecified: Secondary | ICD-10-CM | POA: Diagnosis present

## 2023-02-26 DIAGNOSIS — L98499 Non-pressure chronic ulcer of skin of other sites with unspecified severity: Secondary | ICD-10-CM | POA: Diagnosis not present

## 2023-02-26 DIAGNOSIS — E039 Hypothyroidism, unspecified: Secondary | ICD-10-CM | POA: Diagnosis present

## 2023-02-26 DIAGNOSIS — I7090 Unspecified atherosclerosis: Secondary | ICD-10-CM | POA: Diagnosis not present

## 2023-02-26 LAB — HIV ANTIBODY (ROUTINE TESTING W REFLEX): HIV Screen 4th Generation wRfx: NONREACTIVE

## 2023-02-26 LAB — RAPID URINE DRUG SCREEN, HOSP PERFORMED
Amphetamines: NOT DETECTED
Barbiturates: NOT DETECTED
Benzodiazepines: NOT DETECTED
Cocaine: POSITIVE — AB
Opiates: POSITIVE — AB
Tetrahydrocannabinol: NOT DETECTED

## 2023-02-26 LAB — MAGNESIUM: Magnesium: 2 mg/dL (ref 1.7–2.4)

## 2023-02-26 LAB — CBC WITH DIFFERENTIAL/PLATELET
Abs Immature Granulocytes: 0.15 10*3/uL — ABNORMAL HIGH (ref 0.00–0.07)
Basophils Absolute: 0.1 10*3/uL (ref 0.0–0.1)
Basophils Relative: 1 %
Eosinophils Absolute: 0.3 10*3/uL (ref 0.0–0.5)
Eosinophils Relative: 2 %
HCT: 38.8 % (ref 36.0–46.0)
Hemoglobin: 12.6 g/dL (ref 12.0–15.0)
Immature Granulocytes: 1 %
Lymphocytes Relative: 24 %
Lymphs Abs: 3.3 10*3/uL (ref 0.7–4.0)
MCH: 29.2 pg (ref 26.0–34.0)
MCHC: 32.5 g/dL (ref 30.0–36.0)
MCV: 89.8 fL (ref 80.0–100.0)
Monocytes Absolute: 1.3 10*3/uL — ABNORMAL HIGH (ref 0.1–1.0)
Monocytes Relative: 9 %
Neutro Abs: 8.5 10*3/uL — ABNORMAL HIGH (ref 1.7–7.7)
Neutrophils Relative %: 63 %
Platelets: 325 10*3/uL (ref 150–400)
RBC: 4.32 MIL/uL (ref 3.87–5.11)
RDW: 12.7 % (ref 11.5–15.5)
WBC: 13.6 10*3/uL — ABNORMAL HIGH (ref 4.0–10.5)
nRBC: 0 % (ref 0.0–0.2)

## 2023-02-26 LAB — COMPREHENSIVE METABOLIC PANEL WITH GFR
ALT: 24 U/L (ref 0–44)
AST: 27 U/L (ref 15–41)
Albumin: 3 g/dL — ABNORMAL LOW (ref 3.5–5.0)
Alkaline Phosphatase: 84 U/L (ref 38–126)
Anion gap: 7 (ref 5–15)
BUN: 7 mg/dL — ABNORMAL LOW (ref 8–23)
CO2: 25 mmol/L (ref 22–32)
Calcium: 8.5 mg/dL — ABNORMAL LOW (ref 8.9–10.3)
Chloride: 104 mmol/L (ref 98–111)
Creatinine, Ser: 0.72 mg/dL (ref 0.44–1.00)
GFR, Estimated: >60 mL/min
Glucose, Bld: 47 mg/dL — ABNORMAL LOW (ref 70–99)
Potassium: 3 mmol/L — ABNORMAL LOW (ref 3.5–5.1)
Sodium: 136 mmol/L (ref 135–145)
Total Bilirubin: 0.5 mg/dL (ref 0.3–1.2)
Total Protein: 6.9 g/dL (ref 6.5–8.1)

## 2023-02-26 LAB — CBG MONITORING, ED
Glucose-Capillary: 151 mg/dL — ABNORMAL HIGH (ref 70–99)
Glucose-Capillary: 151 mg/dL — ABNORMAL HIGH (ref 70–99)

## 2023-02-26 LAB — GLUCOSE, CAPILLARY
Glucose-Capillary: 159 mg/dL — ABNORMAL HIGH (ref 70–99)
Glucose-Capillary: 167 mg/dL — ABNORMAL HIGH (ref 70–99)

## 2023-02-26 MED ORDER — ENOXAPARIN SODIUM 30 MG/0.3ML IJ SOSY
30.0000 mg | PREFILLED_SYRINGE | Freq: Every day | INTRAMUSCULAR | Status: DC
  Administered 2023-02-26 – 2023-03-03 (×6): 30 mg via SUBCUTANEOUS
  Filled 2023-02-26 (×6): qty 0.3

## 2023-02-26 MED ORDER — INSULIN ASPART 100 UNIT/ML IJ SOLN
0.0000 [IU] | Freq: Every day | INTRAMUSCULAR | Status: DC
  Administered 2023-02-27: 3 [IU] via SUBCUTANEOUS
  Administered 2023-02-28: 4 [IU] via SUBCUTANEOUS
  Administered 2023-03-01: 3 [IU] via SUBCUTANEOUS
  Administered 2023-03-02 – 2023-03-04 (×2): 4 [IU] via SUBCUTANEOUS
  Administered 2023-03-05: 5 [IU] via SUBCUTANEOUS
  Filled 2023-02-26: qty 0.05

## 2023-02-26 MED ORDER — HYDRALAZINE HCL 20 MG/ML IJ SOLN: 5.0000 mg | Freq: Three times a day (TID) | INTRAMUSCULAR | Status: DC | PRN

## 2023-02-26 MED ORDER — MORPHINE SULFATE (PF) 2 MG/ML IV SOLN: 1.0000 mg | Freq: Four times a day (QID) | INTRAVENOUS | Status: DC | PRN

## 2023-02-26 MED ORDER — BISACODYL 5 MG PO TBEC: 5.0000 mg | DELAYED_RELEASE_TABLET | Freq: Every day | ORAL | Status: DC | PRN

## 2023-02-26 MED ORDER — MELOXICAM 15 MG PO TABS: 15.0000 mg | ORAL_TABLET | Freq: Every day | ORAL | Status: DC

## 2023-02-26 MED ORDER — VANCOMYCIN HCL IN DEXTROSE 1-5 GM/200ML-% IV SOLN
1000.0000 mg | Freq: Once | INTRAVENOUS | Status: AC
  Administered 2023-02-26: 1000 mg via INTRAVENOUS
  Filled 2023-02-26: qty 200

## 2023-02-26 MED ORDER — VENLAFAXINE HCL ER 37.5 MG PO CP24: 37.5000 mg | ORAL_CAPSULE | Freq: Every day | ORAL | Status: DC

## 2023-02-26 MED ORDER — SODIUM CHLORIDE 0.9% FLUSH
3.0000 mL | Freq: Two times a day (BID) | INTRAVENOUS | Status: DC
  Administered 2023-02-26 – 2023-03-06 (×14): 3 mL via INTRAVENOUS

## 2023-02-26 MED ORDER — MEDIHONEY WOUND/BURN DRESSING EX PSTE
1.0000 | PASTE | Freq: Every day | CUTANEOUS | Status: DC
  Administered 2023-02-27 – 2023-03-07 (×8): 1 via TOPICAL
  Filled 2023-02-26: qty 44

## 2023-02-26 MED ORDER — IPRATROPIUM BROMIDE 0.02 % IN SOLN: 0.5000 mg | Freq: Four times a day (QID) | RESPIRATORY_TRACT | Status: DC | PRN

## 2023-02-26 MED ORDER — POTASSIUM CHLORIDE 20 MEQ PO PACK
20.0000 meq | PACK | Freq: Two times a day (BID) | ORAL | Status: AC
  Administered 2023-02-26 (×2): 20 meq via ORAL
  Filled 2023-02-26 (×2): qty 1

## 2023-02-26 MED ORDER — TAMOXIFEN CITRATE 20 MG PO TABS: 20.0000 mg | ORAL_TABLET | Freq: Every day | ORAL | Status: DC

## 2023-02-26 MED ORDER — VITAMIN D 25 MCG (1000 UNIT) PO TABS
5000.0000 [IU] | ORAL_TABLET | Freq: Every day | ORAL | Status: DC
  Administered 2023-02-27 – 2023-03-07 (×8): 5000 [IU] via ORAL
  Filled 2023-02-26 (×8): qty 5

## 2023-02-26 MED ORDER — SENNOSIDES-DOCUSATE SODIUM 8.6-50 MG PO TABS: 1.0000 | ORAL_TABLET | Freq: Every evening | ORAL | Status: DC | PRN

## 2023-02-26 MED ORDER — ONDANSETRON HCL 4 MG/2ML IJ SOLN: 4.0000 mg | Freq: Four times a day (QID) | INTRAMUSCULAR | Status: DC | PRN

## 2023-02-26 MED ORDER — ACETAMINOPHEN 325 MG PO TABS: 650.0000 mg | ORAL_TABLET | Freq: Four times a day (QID) | ORAL | Status: DC | PRN

## 2023-02-26 MED ORDER — ACETAMINOPHEN 650 MG RE SUPP: 650.0000 mg | Freq: Four times a day (QID) | RECTAL | Status: DC | PRN

## 2023-02-26 MED ORDER — GABAPENTIN 100 MG PO CAPS
100.0000 mg | ORAL_CAPSULE | Freq: Three times a day (TID) | ORAL | Status: DC
  Administered 2023-02-26 – 2023-03-07 (×27): 100 mg via ORAL
  Filled 2023-02-26 (×27): qty 1

## 2023-02-26 MED ORDER — LOSARTAN POTASSIUM 50 MG PO TABS
50.0000 mg | ORAL_TABLET | Freq: Every day | ORAL | Status: DC
  Administered 2023-02-26: 50 mg via ORAL
  Filled 2023-02-26: qty 1
  Filled 2023-02-26: qty 2

## 2023-02-26 MED ORDER — TRAZODONE HCL 50 MG PO TABS
25.0000 mg | ORAL_TABLET | Freq: Every evening | ORAL | Status: DC | PRN
  Administered 2023-03-01 – 2023-03-06 (×3): 25 mg via ORAL
  Filled 2023-02-26 (×4): qty 1

## 2023-02-26 MED ORDER — ONDANSETRON HCL 4 MG PO TABS: 4.0000 mg | ORAL_TABLET | Freq: Four times a day (QID) | ORAL | Status: DC | PRN

## 2023-02-26 MED ORDER — VANCOMYCIN HCL 500 MG/100ML IV SOLN
500.0000 mg | INTRAVENOUS | Status: DC
  Administered 2023-02-27 – 2023-03-02 (×4): 500 mg via INTRAVENOUS
  Filled 2023-02-26 (×5): qty 100

## 2023-02-26 MED ORDER — METOPROLOL TARTRATE 50 MG PO TABS
50.0000 mg | ORAL_TABLET | Freq: Every day | ORAL | Status: DC
  Administered 2023-02-26: 50 mg via ORAL
  Filled 2023-02-26: qty 1
  Filled 2023-02-26: qty 2

## 2023-02-26 MED ORDER — LEVOTHYROXINE SODIUM 100 MCG PO TABS
100.0000 ug | ORAL_TABLET | Freq: Every day | ORAL | Status: DC
  Administered 2023-02-26 – 2023-03-07 (×9): 100 ug via ORAL
  Filled 2023-02-26 (×10): qty 1

## 2023-02-26 MED ORDER — ALBUTEROL SULFATE (2.5 MG/3ML) 0.083% IN NEBU: 2.5000 mg | INHALATION_SOLUTION | Freq: Four times a day (QID) | RESPIRATORY_TRACT | Status: DC | PRN

## 2023-02-26 MED ORDER — HYDROCODONE-ACETAMINOPHEN 5-325 MG PO TABS
1.0000 | ORAL_TABLET | ORAL | Status: DC | PRN
  Administered 2023-03-01: 1 via ORAL
  Administered 2023-03-05 – 2023-03-07 (×8): 2 via ORAL
  Filled 2023-02-26 (×4): qty 2
  Filled 2023-02-26: qty 1
  Filled 2023-02-26 (×4): qty 2

## 2023-02-26 MED ORDER — INSULIN ASPART 100 UNIT/ML IJ SOLN
0.0000 [IU] | Freq: Three times a day (TID) | INTRAMUSCULAR | Status: DC
  Administered 2023-02-26 (×3): 3 [IU] via SUBCUTANEOUS
  Administered 2023-02-27 (×3): 5 [IU] via SUBCUTANEOUS
  Administered 2023-02-28: 8 [IU] via SUBCUTANEOUS
  Administered 2023-02-28: 2 [IU] via SUBCUTANEOUS
  Administered 2023-02-28: 3 [IU] via SUBCUTANEOUS
  Administered 2023-03-01 (×3): 8 [IU] via SUBCUTANEOUS
  Administered 2023-03-02: 5 [IU] via SUBCUTANEOUS
  Administered 2023-03-02: 3 [IU] via SUBCUTANEOUS
  Administered 2023-03-02: 11 [IU] via SUBCUTANEOUS
  Administered 2023-03-03: 5 [IU] via SUBCUTANEOUS
  Administered 2023-03-03: 15 [IU] via SUBCUTANEOUS
  Administered 2023-03-03: 3 [IU] via SUBCUTANEOUS
  Administered 2023-03-04: 15 [IU] via SUBCUTANEOUS
  Administered 2023-03-04: 3 [IU] via SUBCUTANEOUS
  Administered 2023-03-05: 5 [IU] via SUBCUTANEOUS
  Administered 2023-03-05 (×2): 3 [IU] via SUBCUTANEOUS
  Administered 2023-03-06: 5 [IU] via SUBCUTANEOUS
  Administered 2023-03-06: 8 [IU] via SUBCUTANEOUS
  Administered 2023-03-06: 11 [IU] via SUBCUTANEOUS
  Administered 2023-03-07: 3 [IU] via SUBCUTANEOUS
  Filled 2023-02-26: qty 0.15

## 2023-02-26 NOTE — H&P (Signed)
History and Physical   [facility] [place] [facility] Admission History and Physical [employer], D.O.    Patient Name: Martha Lozano MR#: [id number] Date of Birth: 10/24/1960 Date of Admission: 02/25/2023  Referring MD/NP/PA: Dr. Preston Fleeting Primary Care Physician: Patient, [name] Per  Chief Complaint:  Chief Complaint  Patient presents with   Foot Pain    HPI: Martha Lozano is a 62 y.o. female with a known history of breast cancer, diabetes with neuropathy, hepatitis C, substance use presents to the emergency department for evaluation of left foot pain.  Patient was emergency department 3 weeks ago and diagnosed with cellulitis, prescribed doxycycline.  She subsequently received a different antibiotic unsure of the name at an urgent care she reports that she was compliant with both antibiotics.  She comes into the hospital tonight sent from Urgent care for worsening pain, redness and swelling in her left foot and associated ulceration on her heel as well as blister formation on her great toe.  Patient denies fevers/chills, weakness, dizziness, chest pain, shortness of breath, N/V/C/D, abdominal pain, dysuria/frequency, changes in mental status.    Otherwise there has been no change in status. Patient has been taking medication as prescribed and there has been no recent change in medication or diet.  No recent antibiotics.  There has been no recent illness, hospitalizations, travel or sick contacts.    EMS/ED Course: Patient received Vanco. Medical admission has been requested for further management of cellulitis of the left foot, failed outpatient treatment, electrolyte abnormalities. .  Review of Systems:  CONSTITUTIONAL: No fever/chills, fatigue, weakness, weight gain/loss, headache. EYES: No blurry or double vision. ENT: No tinnitus, postnasal drip, redness or soreness of the oropharynx. RESPIRATORY: No cough, dyspnea, wheeze.  No hemoptysis.  CARDIOVASCULAR: No  chest pain, palpitations, syncope, orthopnea. No lower extremity edema.  GASTROINTESTINAL: No nausea, vomiting, abdominal pain, diarrhea, constipation.  No hematemesis, melena or hematochezia. GENITOURINARY: No dysuria, frequency, hematuria. ENDOCRINE: No polyuria or nocturia. No heat or cold intolerance. HEMATOLOGY: No anemia, bruising, bleeding. INTEGUMENTARY: No rashes, ulcers, lesions. MUSCULOSKELETAL: Positive left foot pain, redness and swelling.  No arthritis, gout. NEUROLOGIC: No numbness, tingling, ataxia, seizure-type activity, weakness. PSYCHIATRIC: No anxiety, depression, insomnia.   Past Medical History:  Diagnosis Date   Breast cancer, stage 0, right    pt currently taking Tamoxifen   Diabetes mellitus without complication (HCC)    Family history of breast cancer    Family history of prostate cancer    Family history of skin cancer    Hep C w/o coma, chronic (HCC)    Hx of hepatitis C    pt received treatment for Hep C in 2018   Sciatic pain     Past Surgical History:  Procedure Laterality Date   BREAST BIOPSY Right 09/12/2017   INCISION AND DRAINAGE ABSCESS Left    hand     reports that she has been smoking cigarettes. She has a 11.6 pack-year smoking history. She has never used smokeless tobacco. She reports current alcohol use. She reports that she does not currently use drugs.  No Known Allergies  Family History  Problem Relation Age of Onset   Diabetes Sister    Hypertension Sister    Breast cancer Sister 49   Diabetes Brother    Breast cancer Maternal Grandmother 11   Cervical cancer Mother    Prostate cancer Father 88       metastatic to bones   Cancer Maternal Aunt  type unk dx <50   Skin cancer Paternal Uncle    Pulmonary embolism Maternal Grandfather 42   Cancer Paternal Uncle    Breast cancer Other    Stomach cancer Other     Prior to Admission medications   Medication Sig Start Date End Date Taking? Authorizing Provider   atorvastatin (LIPITOR) 20 MG tablet Take 1 tablet (20 mg total) by mouth daily. Patient not taking: Reported on 02/25/2023 11/15/20   Serena Croissant, MD  Buprenorphine HCl-Naloxone HCl (SUBOXONE) 12-3 MG FILM Place 1 Film under the tongue 2 (two) times daily.     [provider]  [facility] D3 5000 units capsule Take 5,000 Units by mouth daily. 12/06/17   [provider]  doxycycline (VIBRAMYCIN) 100 MG capsule Take 1 capsule (100 mg total) by mouth 2 (two) times daily. 02/06/23   Fayrene Helper, PA-C  gabapentin (NEURONTIN) 100 MG capsule Take 100 mg by mouth 3 (three) times daily. 11/22/20   [provider]  glipiZIDE (GLUCOTROL XL) 5 MG 24 hr tablet Take 1 tablet (5 mg total) by mouth daily with breakfast. 11/06/18   Serena Croissant, MD  levothyroxine (SYNTHROID) 100 MCG tablet Take 1 tablet (100 mcg total) by mouth daily before breakfast. 11/15/20   Serena Croissant, MD  losartan (COZAAR) 50 MG tablet Take 1 tablet (50 mg total) by mouth daily. 11/10/18   Serena Croissant, MD  meloxicam (MOBIC) 15 MG tablet Take 1 tablet by mouth daily. 11/21/20   [provider]  metFORMIN (GLUCOPHAGE) 500 MG tablet Take 1 tablet (500 mg total) by mouth daily with breakfast. Patient taking differently: Take 1,000 mg by mouth 2 (two) times daily with a meal.  03/02/14   Advani, Ayesha Rumpf, MD  metoprolol tartrate (LOPRESSOR) 50 MG tablet TAKE 1 TABLET BY MOUTH EVERY DAY 11/11/18   Serena Croissant, MD  naloxone [facility]) nasal spray 4 mg/0.1 mL SMARTSIG:Both Nares 11/08/20   [provider]  tamoxifen (NOLVADEX) 20 MG tablet TAKE 1 TABLET BY MOUTH EVERY DAY 01/14/23   Serena Croissant, MD  venlafaxine XR (EFFEXOR-XR) 37.5 MG 24 hr capsule Take by mouth. 11/11/18   [provider]    Physical Exam: Vitals:   02/26/23 0042 02/26/23 0234 02/26/23 0234 02/26/23 0237  BP:    (!) 97/54  Pulse: 66 89  89  Resp:    18  Temp:   97.8 F (36.6 C)   TempSrc:   Oral   SpO2: 98% 99%  99%    GENERAL: 62  y.o.-year-old white female patient, well-developed, well-nourished lying in the bed in no acute distress.  Pleasant and cooperative.   HEENT: Head atraumatic, normocephalic. Pupils equal. Mucus membranes moist. NECK: Supple. No JVD. CHEST: Normal breath sounds bilaterally. No wheezing, rales, rhonchi or crackles. No use of accessory muscles of respiration.  No reproducible chest wall tenderness.  CARDIOVASCULAR: S1, S2 normal. No murmurs, rubs, or gallops. Cap refill <2 seconds. Pulses intact distally.  ABDOMEN: Soft, nondistended, nontender. No rebound, guarding, rigidity. Normoactive bowel sounds present in all four quadrants.  EXTREMITIES: No pedal edema, cyanosis, or clubbing. No calf tenderness or Homan's sign.  NEUROLOGIC: The patient is alert and oriented x 3. Cranial nerves II through XII are grossly intact with no focal sensorimotor deficit. PSYCHIATRIC:  Normal affect, mood, thought content. SKIN: There is a 1 cm ulceration over the left heel with thickened skin necrotic center, skin sloughing all around that wound.  There is a large blister formed over the medial aspect  of the left first toe with redness and swelling of the entire left foot    Labs on Admission:  CBC: Recent Labs  Lab 02/25/23 1559 02/25/23 2110  WBC 11.4* 11.2*  NEUTROABS 7.0 6.9  HGB 11.4* 12.0  HCT 34.0* 35.7*  MCV 85.4 87.3  PLT 200 219   Basic Metabolic Panel: Recent Labs  Lab 02/25/23 1559 02/25/23 2110  NA 132* 134*  K 3.0* 3.0*  CL 99 99  CO2 24 25  GLUCOSE 218* 189*  BUN 7* 9  CREATININE 0.68 0.74  CALCIUM 8.4* 8.8*   GFR: Estimated Creatinine Clearance: 45 mL/min (by C-G formula based on SCr of 0.74 mg/dL). Liver Function Tests: Recent Labs  Lab 02/25/23 1559  AST 28  ALT 23  ALKPHOS 83  BILITOT 0.7  PROT 6.3*  ALBUMIN 2.8*   No results for input(s): "LIPASE", "AMYLASE" in the last 168 hours. No results for input(s): "AMMONIA" in the last 168 hours. Coagulation Profile: No  results for input(s): "INR", "PROTIME" in the last 168 hours. Cardiac Enzymes: No results for input(s): "CKTOTAL", "CKMB", "CKMBINDEX", "TROPONINI" in the last 168 hours. BNP (last 3 results) No results for input(s): "PROBNP" in the last 8760 hours. HbA1C: No results for input(s): "HGBA1C" in the last 72 hours. CBG: Recent Labs  Lab 02/25/23 2051  GLUCAP 201*   Lipid Profile: No results for input(s): "CHOL", "HDL", "LDLCALC", "TRIG", "CHOLHDL", "LDLDIRECT" in the last 72 hours. Thyroid Function Tests: No results for input(s): "TSH", "T4TOTAL", "FREET4", "T3FREE", "THYROIDAB" in the last 72 hours. Anemia Panel: No results for input(s): "VITAMINB12", "FOLATE", "FERRITIN", "TIBC", "IRON", "RETICCTPCT" in the last 72 hours. Urine analysis:    Component Value Date/Time   COLORURINE YELLOW 02/25/2023 1618   APPEARANCEUR HAZY (A) 02/25/2023 1618   LABSPEC 1.027 02/25/2023 1618   PHURINE 5.0 02/25/2023 1618   GLUCOSEU >=500 (A) 02/25/2023 1618   HGBUR MODERATE (A) 02/25/2023 1618   BILIRUBINUR NEGATIVE 02/25/2023 1618   KETONESUR NEGATIVE 02/25/2023 1618   PROTEINUR 30 (A) 02/25/2023 1618   UROBILINOGEN 2.0 (H) 04/11/2011 2014   NITRITE NEGATIVE 02/25/2023 1618   LEUKOCYTESUR TRACE (A) 02/25/2023 1618   Sepsis Labs: @LABRCNTIP (procalcitonin:4,lacticidven:4) )No results found for this or any previous visit (from the past 240 hour(s)).   Radiological Exams on Admission: CT FOOT LEFT W CONTRAST  Result Date: 02/26/2023 CLINICAL DATA:  Redness to great toe, large wound to posterior heel, evaluate for osteomyelitis EXAM: CT OF THE LOWER LEFT EXTREMITY WITH CONTRAST TECHNIQUE: Multidetector CT imaging of the lower left extremity was performed according to the standard protocol following intravenous contrast administration. RADIATION DOSE REDUCTION: This exam was performed according to the departmental dose-optimization program which includes automated exposure control, adjustment of the  mA and/or kV according to patient size and/or use of iterative reconstruction technique. CONTRAST:  75mL OMNIPAQUE IOHEXOL 300 MG/ML  SOLN COMPARISON:  Left foot radiographs dated 02/25/2023 FINDINGS: Subcutaneous fluid collection measuring approximately 16 x 13 x 9 mm (series 8/image 54; series 10/image 23), along the medial aspect of the 1st proximal phalanx, suspicious for tiny subcutaneous boil/abscess. No adjacent cortical destruction to suggest osteomyelitis. Mild soft tissue irregularity along the posterior aspect of the calcaneus (series 8/image 60), likely corresponding to the patient's known soft tissue wound. No adjacent cortical destruction to suggest osteomyelitis. No fracture or dislocation is seen. The joint spaces are preserved. IMPRESSION: No evidence of osteomyelitis. Specifically, the 1st digit and posterior calcaneus remain intact. 16 mm subcutaneous fluid collection along the medial  aspect of the 1st proximal phalanx, suspicious for tiny subcutaneous boil/abscess. Electronically Signed   By: Charline Bills M.D.   On: 02/26/2023 00:01   DG Foot Complete Left  Result Date: 02/25/2023 CLINICAL DATA:  Infection. EXAM: LEFT FOOT - COMPLETE 3+ VIEW COMPARISON:  None Available. FINDINGS: Minimal irregularity of the tip of the distal phalanx of the great toe may represent osteomyelitis. Clinical correlation is recommended. There is no acute fracture or dislocation. No significant arthritic changes. There is soft tissue swelling of the forefoot and great toe. No radiopaque foreign object or soft tissue gas. IMPRESSION: 1. No acute fracture or dislocation. 2. Possible osteomyelitis of the distal phalanx of the great toe. Clinical correlation is recommended. Electronically Signed   By: Elgie Collard M.D.   On: 02/25/2023 21:35   DG Chest 2 View  Result Date: 02/25/2023 CLINICAL DATA:  Chest pain.  Chest EXAM: CHEST - 2 VIEW COMPARISON:  [place] dated 12/23/2012. FINDINGS: The lungs are  clear. There is no pleural effusion or pneumothorax. The cardiac silhouette is within normal limits. No acute osseous pathology. IMPRESSION: No active cardiopulmonary disease. Electronically Signed   By: Elgie Collard M.D.   On: 02/25/2023 16:42      Assessment/Plan  This is a 62 y.o. female with a history of breast cancer, diabetes with neuropathy, hepatitis C, hypertension, hyperlipidemia substance use now being admitted with:  #.  Cellulitis of the left foot in the setting of diabetes - Admit observation - Check MRI to evaluate for osteomyelitis - Continue IV vancomycin - Pain control - Local wound care -Consider podiatry evaluation  #.  Mild hypokalemia - Replace orally  #. H/o Diabetes - Accuchecks achs with RISS coverage - Heart healthy, carb controlled diet -Hold glipizide and metformin   #. History of neuropathy - Continue gabapentin, Mobic  #. History of breast cancer - Continue tamoxifen  #. History of hypothyroidism - Continue levothyroxine  #. History of hypertension - Continue losartan, metoprolol  Admission status: Observation IV Fluids: Hep-Lock Diet/Nutrition: Heart healthy, carb controlled Consults called: Wound care DVT Px: Lovenox, SCDs and early ambulation. Code Status: Full Code  Disposition Plan: To home in 1-2 days  All the records are reviewed and case discussed with ED provider. Management plans discussed with the patient and/or family who express understanding and agree with plan of care.  Toniette Devera D.O. on 02/26/2023 at 2:40 AM CC: Primary care physician; Patient, [name] Per   02/26/2023, 2:40 AM

## 2023-02-26 NOTE — ED Notes (Signed)
First attempt at blood draw unsuccessful. Patient refusing 2nd attempt at this time.

## 2023-02-26 NOTE — Consult Note (Addendum)
WOC Nurse Consult Note: Reason for Consult: L heel ulcer  Wound type: full thickness, likely diabetic foot ulcer  Pressure Injury POA: NA  Measurement: 2 cm x 2 cm 100% brown dry necrotic tissue   Drainage (amount, consistency, odor) none  Periwound: callused, some peeling skin  Dressing procedure/placement/frequency: Clean L heel with NS, apply Medihoney to wound bed daily, cover with dry gauze and secure with kerlix roll gauze.  Patient is pending MRI, CT scan found no evidence of osteomyelitis.    Patient is pending podiatry consult for L great toe which appears to be a traumatic injury with edema and ecchymosis of toe, CT reveals subcutaneous fluid collection measuring approximately 16 x 13 x 9 mm  along the medial aspect of the 1st proximal phalanx, suspicious for tiny subcutaneous boil/abscess. No adjacent cortical destruction to suggest osteomyelitis.  Patient has a 1 cm x 1 cm area of full thickness  L plantar foot base of great toe, this too is heavily callused surrounding.  Will not write wound care orders as pending podiatry eval.    POC discussed with patient, bedside nurse and primary MD.  Patient would benefit from outpatient follow-up at wound care center or podiatry office for L heel ulcer.   WOC team will not follow.  Re-consult if further needs arise.   Thank you,    Priscella Mann MSN, RN-BC, [employer] [phone]

## 2023-02-26 NOTE — Progress Notes (Signed)
[facility] PLAN OF CARE NOTE Patient: Martha Lozano NWG:[id number]   PCP: Patient, [name] Per DOB: September 01, 1960   DOA: 02/25/2023   DOS: 02/26/2023    Patient was admitted by my colleague earlier on 02/26/2023. I have reviewed the H&P as well as assessment and plan and agree with the same. Important changes in the plan are listed below.  Plan of care: Principal Problem:   Cellulitis of left lower extremity Active Problems:   Cellulitis Cocaine positive. Continue with antibiotic. Will consult podiatry.  Most likely treatment with IV antibiotics. Patient requesting Suboxone although has not received this medication since May 2024 therefore will not prescribe it.  Continue as needed pain medication.  Level of care: MedSurg  Author: Lynden Oxford, MD  [facility] 02/26/2023 6:20 PM   If 7PM-7AM, please contact night-coverage at [email or web address]

## 2023-02-26 NOTE — Progress Notes (Signed)
Pharmacy Antibiotic Note  Martha Lozano is a 62 y.o. female admitted on 02/25/2023 with left foot cellulitis.  Pharmacy has been consulted for Vancomycin dosing.  Patient received Vancomycin 1gm IV x 1 dose in the ED.  Plan: Vancomycin 500 mg IV Q 24 hrs. Goal AUC 400-550.  Expected AUC: 430.9  SCr used: 0.8 (rounded up from 0.74) Follow renal function Monitor vancomycin levels as needed     Temp (24hrs), Avg:98.2 F (36.8 C), Min:97.8 F (36.6 C), Max:98.6 F (37 C)  Recent Labs  Lab 02/25/23 1559 02/25/23 1604 02/25/23 2110 02/25/23 2118 02/25/23 2341  WBC 11.4*  --  11.2*  --   --   CREATININE 0.68  --  0.74  --   --   LATICACIDVEN  --  0.5  --  0.7 0.7    Estimated Creatinine Clearance: 45 mL/min (by C-G formula based on SCr of 0.74 mg/dL).    No Known Allergies  Antimicrobials this admission: 8/13 Vancomycin >>      Dose adjustments this admission:    Microbiology results:    Thank you for allowing pharmacy to be a part of this patient's care.  Maryellen Pile, PharmD 02/26/2023 2:47 AM

## 2023-02-26 NOTE — ED Notes (Signed)
ED TO INPATIENT HANDOFF REPORT  Name/Age/Gender Martha Lozano 62 y.o. female  Code Status    Code Status Orders  (From admission, onward)           Start     Ordered   02/26/23 0243  Full code  Continuous       Question:  By:  Answer:  Consent: discussion documented in EHR   02/26/23 0243           Code Status History     This patient has a current code status but no historical code status.       Home/SNF/Other Home  Chief Complaint Cellulitis of left lower extremity [L03.116]  Level of Care/Admitting Diagnosis ED Disposition     ED Disposition  Admit   Condition  --   Comment  Hospital Area: [facility] [[id number]]  Level of Care: Med-Surg [16]  May place patient in observation at [facility] or [facility] if equivalent level of care is available:: No  Covid Evaluation: Asymptomatic - no recent exposure (last 10 days) testing not required  Diagnosis: Cellulitis of left lower extremity [161096]  Admitting Physician: Tonye Royalty [[id number]]  Attending Physician: Tonye Royalty [[id number]]          Medical History Past Medical History:  Diagnosis Date   Breast cancer, stage 0, right    pt currently taking Tamoxifen   Diabetes mellitus without complication (HCC)    Family history of breast cancer    Family history of prostate cancer    Family history of skin cancer    Hep C w/o coma, chronic (HCC)    Hx of hepatitis C    pt received treatment for Hep C in 2018   Sciatic pain     Allergies No Known Allergies  IV Location/Drains/Wounds Patient Lines/Drains/Airways Status     Active Line/Drains/Airways     Name Placement date Placement time Site Days   Peripheral IV 02/25/23 20 G 1" Right Antecubital 02/25/23  2322  Antecubital  1   Wound / Incision (Open or Dehisced) 02/06/23 Other (Comment) Heel Left 02/06/23  1757  Heel  20            Labs/Imaging Results for orders placed or performed during the  hospital encounter of 02/25/23 (from the past 48 hour(s))  CBG monitoring, ED     Status: Abnormal   Collection Time: 02/25/23  8:51 PM  Result Value Ref Range   Glucose-Capillary 201 (H) 70 - 99 mg/dL    Comment: Glucose reference range applies only to samples taken after fasting for at least 8 hours.  CBC with Differential     Status: Abnormal   Collection Time: 02/25/23  9:10 PM  Result Value Ref Range   WBC 11.2 (H) 4.0 - 10.5 K/uL   RBC 4.09 3.87 - 5.11 MIL/uL   Hemoglobin 12.0 12.0 - 15.0 g/dL   HCT 29.5 (L) 62.1 - 30.8 %   MCV 87.3 80.0 - 100.0 fL   MCH 29.3 26.0 - 34.0 pg   MCHC 33.6 30.0 - 36.0 g/dL   RDW 65.7 84.6 - 96.2 %   Platelets 219 150 - 400 K/uL   nRBC 0.0 0.0 - 0.2 %   Neutrophils Relative % 61 %   Neutro Abs 6.9 1.7 - 7.7 K/uL   Lymphocytes Relative 26 %   Lymphs Abs 2.9 0.7 - 4.0 K/uL   Monocytes Relative 9 %   Monocytes Absolute 1.0 0.1 - 1.0  K/uL   Eosinophils Relative 2 %   Eosinophils Absolute 0.2 0.0 - 0.5 K/uL   Basophils Relative 1 %   Basophils Absolute 0.1 0.0 - 0.1 K/uL   Immature Granulocytes 1 %   Abs Immature Granulocytes 0.11 (H) 0.00 - 0.07 K/uL    Comment: Performed at [facility], [address], [place], [place] [address]  Basic metabolic panel     Status: Abnormal   Collection Time: 02/25/23  9:10 PM  Result Value Ref Range   Sodium 134 (L) 135 - 145 mmol/L   Potassium 3.0 (L) 3.5 - 5.1 mmol/L   Chloride 99 98 - 111 mmol/L   CO2 25 22 - 32 mmol/L   Glucose, Bld 189 (H) 70 - 99 mg/dL    Comment: Glucose reference range applies only to samples taken after fasting for at least 8 hours.   BUN 9 8 - 23 mg/dL   Creatinine, Ser 8.41 0.44 - 1.00 mg/dL   Calcium 8.8 (L) 8.9 - 10.3 mg/dL   GFR, Estimated >32 >44 mL/min    Comment: (NOTE) Calculated using the CKD-EPI Creatinine Equation (2021)    Anion gap 10 5 - 15    Comment: Performed at [facility], [address] [place], [place] [address]   I-Stat CG4 Lactic Acid     Status: None   Collection Time: 02/25/23  9:18 PM  Result Value Ref Range   Lactic Acid, Venous 0.7 0.5 - 1.9 mmol/L  C-reactive protein     Status: Abnormal   Collection Time: 02/25/23 11:35 PM  Result Value Ref Range   CRP 9.2 (H) <1.0 mg/dL    Comment: Performed at [facility], [address], [place], [place] [address]  Sedimentation rate     Status: Abnormal   Collection Time: 02/25/23 11:35 PM  Result Value Ref Range   Sed Rate 40 (H) 0 - 22 mm/hr    Comment: Performed at [facility], [address] [place], [place] [address]  I-Stat CG4 Lactic Acid     Status: None   Collection Time: 02/25/23 11:41 PM  Result Value Ref Range   Lactic Acid, Venous 0.7 0.5 - 1.9 mmol/L  CBG monitoring, ED     Status: Abnormal   Collection Time: 02/26/23  9:03 AM  Result Value Ref Range   Glucose-Capillary 151 (H) 70 - 99 mg/dL    Comment: Glucose reference range applies only to samples taken after fasting for at least 8 hours.  Comprehensive metabolic panel     Status: Abnormal   Collection Time: 02/26/23 11:10 AM  Result Value Ref Range   Sodium 136 135 - 145 mmol/L   Potassium 3.0 (L) 3.5 - 5.1 mmol/L   Chloride 104 98 - 111 mmol/L   CO2 25 22 - 32 mmol/L   Glucose, Bld 47 (L) 70 - 99 mg/dL    Comment: Glucose reference range applies only to samples taken after fasting for at least 8 hours.   BUN 7 (L) 8 - 23 mg/dL   Creatinine, Ser 7.42 0.44 - 1.00 mg/dL   Calcium 8.5 (L) 8.9 - 10.3 mg/dL   Total Protein 6.9 6.5 - 8.1 g/dL   Albumin 3.0 (L) 3.5 - 5.0 g/dL   AST 27 15 - 41 U/L   ALT 24 0 - 44 U/L   Alkaline Phosphatase 84 38 - 126 U/L   Total Bilirubin 0.5 0.3 - 1.2 mg/dL   GFR,  Estimated >60 >60 mL/min    Comment: (NOTE) Calculated using the CKD-EPI Creatinine Equation (2021)    Anion gap 7 5 - 15    Comment: Performed at [facility], [address] [place], [place] [address]  CBC with  Differential/Platelet     Status: Abnormal   Collection Time: 02/26/23 11:10 AM  Result Value Ref Range   WBC 13.6 (H) 4.0 - 10.5 K/uL   RBC 4.32 3.87 - 5.11 MIL/uL   Hemoglobin 12.6 12.0 - 15.0 g/dL   HCT 91.4 78.2 - 95.6 %   MCV 89.8 80.0 - 100.0 fL   MCH 29.2 26.0 - 34.0 pg   MCHC 32.5 30.0 - 36.0 g/dL   RDW 21.3 08.6 - 57.8 %   Platelets 325 150 - 400 K/uL   nRBC 0.0 0.0 - 0.2 %   Neutrophils Relative % 63 %   Neutro Abs 8.5 (H) 1.7 - 7.7 K/uL   Lymphocytes Relative 24 %   Lymphs Abs 3.3 0.7 - 4.0 K/uL   Monocytes Relative 9 %   Monocytes Absolute 1.3 (H) 0.1 - 1.0 K/uL   Eosinophils Relative 2 %   Eosinophils Absolute 0.3 0.0 - 0.5 K/uL   Basophils Relative 1 %   Basophils Absolute 0.1 0.0 - 0.1 K/uL   Immature Granulocytes 1 %   Abs Immature Granulocytes 0.15 (H) 0.00 - 0.07 K/uL    Comment: Performed at [facility], [address] [place], [place] [address]  Magnesium     Status: None   Collection Time: 02/26/23 11:10 AM  Result Value Ref Range   Magnesium 2.0 1.7 - 2.4 mg/dL    Comment: Performed at [facility], [address], [place], [place] [address]  CBG monitoring, ED     Status: Abnormal   Collection Time: 02/26/23 12:43 PM  Result Value Ref Range   Glucose-Capillary 151 (H) 70 - 99 mg/dL    Comment: Glucose reference range applies only to samples taken after fasting for at least 8 hours.   MR FOOT LEFT WO CONTRAST  Result Date: 02/26/2023 CLINICAL DATA:  Osteonecrosis suspected, foot, x-ray done. Previous studies performed for great toe erythema and possible osteomyelitis. EXAM: MRI OF THE LEFT FOOT WITHOUT CONTRAST TECHNIQUE: Multiplanar, multisequence MR imaging of the left forefoot was performed. No intravenous contrast was administered. COMPARISON:  CT and radiographs of the left foot 02/25/2023. No other comparison imaging. FINDINGS: Bones/Joint/Cartilage There is osteolysis and sclerosis of the tip of the distal 1st  phalanx with associated low T1 marrow signal. No significant marrow T2 hyperintensity is identified. The proximal phalanx appears normal. There are minimal degenerative changes at the 1st metatarsophalangeal joint. No significant joint effusions. The alignment at the Lisfranc joint is normal. Ligaments Intact Lisfranc ligament. Intact collateral ligaments of the metatarsophalangeal joints. Muscles and Tendons Mild nonspecific generalized muscular edema. No evidence of forefoot tendon tear or suspicious tenosynovitis. A small amount of fluid within the flexor hallucis longus tendon sheath in the midfoot typically relates to communication with the ankle joint. Soft tissues As seen on earlier CT, there is an ill-defined subcutaneous collection medial to the proximal phalanx of the great toe which measures approximately 1.5 x 0.9 x 1.3 cm. In this general facility, there is focal skin ulceration along the plantar aspect of the proximal phalanx of the great toe, best seen on the short axis axial images. No other focal fluid collections are identified. There is generalized soft tissue swelling  in the great toe as well as nonspecific edema throughout the dorsal subcutaneous tissues. IMPRESSION: 1. Focal skin ulceration along the plantar aspect of the proximal phalanx of the great toe with nearby ill-defined subcutaneous collection medial to the proximal phalanx of the great toe suspicious for a small abscess. 2. No definitive signs of osteomyelitis or septic arthritis. Nonspecific acro-osteolysis of the distal 1st phalanx which has a broad differential, including connective tissue, vascular, thermal and arthropathic etiologies. Correlate clinically. 3. No evidence of forefoot tendon tear or suspicious tenosynovitis. Electronically Signed   By: Carey Bullocks M.D.   On: 02/26/2023 10:09   CT FOOT LEFT W CONTRAST  Result Date: 02/26/2023 CLINICAL DATA:  Redness to great toe, large wound to posterior heel, evaluate for  osteomyelitis EXAM: CT OF THE LOWER LEFT EXTREMITY WITH CONTRAST TECHNIQUE: Multidetector CT imaging of the lower left extremity was performed according to the standard protocol following intravenous contrast administration. RADIATION DOSE REDUCTION: This exam was performed according to the departmental dose-optimization program which includes automated exposure control, adjustment of the mA and/or kV according to patient size and/or use of iterative reconstruction technique. CONTRAST:  75mL OMNIPAQUE IOHEXOL 300 MG/ML  SOLN COMPARISON:  Left foot radiographs dated 02/25/2023 FINDINGS: Subcutaneous fluid collection measuring approximately 16 x 13 x 9 mm (series 8/image 54; series 10/image 23), along the medial aspect of the 1st proximal phalanx, suspicious for tiny subcutaneous boil/abscess. No adjacent cortical destruction to suggest osteomyelitis. Mild soft tissue irregularity along the posterior aspect of the calcaneus (series 8/image 60), likely corresponding to the patient's known soft tissue wound. No adjacent cortical destruction to suggest osteomyelitis. No fracture or dislocation is seen. The joint spaces are preserved. IMPRESSION: No evidence of osteomyelitis. Specifically, the 1st digit and posterior calcaneus remain intact. 16 mm subcutaneous fluid collection along the medial aspect of the 1st proximal phalanx, suspicious for tiny subcutaneous boil/abscess. Electronically Signed   By: Charline Bills M.D.   On: 02/26/2023 00:01   DG Foot Complete Left  Result Date: 02/25/2023 CLINICAL DATA:  Infection. EXAM: LEFT FOOT - COMPLETE 3+ VIEW COMPARISON:  None Available. FINDINGS: Minimal irregularity of the tip of the distal phalanx of the great toe may represent osteomyelitis. Clinical correlation is recommended. There is no acute fracture or dislocation. No significant arthritic changes. There is soft tissue swelling of the forefoot and great toe. No radiopaque foreign object or soft tissue gas.  IMPRESSION: 1. No acute fracture or dislocation. 2. Possible osteomyelitis of the distal phalanx of the great toe. Clinical correlation is recommended. Electronically Signed   By: Elgie Collard M.D.   On: 02/25/2023 21:35   DG Chest 2 View  Result Date: 02/25/2023 CLINICAL DATA:  Chest pain.  Chest EXAM: CHEST - 2 VIEW COMPARISON:  [place] dated 12/23/2012. FINDINGS: The lungs are clear. There is no pleural effusion or pneumothorax. The cardiac silhouette is within normal limits. No acute osseous pathology. IMPRESSION: No active cardiopulmonary disease. Electronically Signed   By: Elgie Collard M.D.   On: 02/25/2023 16:42    Pending Labs Unresulted Labs (From admission, onward)     Start     Ordered   02/26/23 0830  Culture, blood (Routine X 2) w Reflex to ID Panel  BLOOD CULTURE X 2,   R (with TIMED occurrences)      02/26/23 0830   02/26/23 0711  HIV Antibody (routine testing w rflx)  (HIV Antibody (Routine testing w reflex) panel)  Once,   R  02/26/23 0711   02/26/23 0711  Hemoglobin A1c  (Glycemic Control (SSI)  Q 4 Hours / Glycemic Control (SSI)  AC +/- HS)  Once,   R       Comments: To assess prior glycemic control    02/26/23 0711   02/26/23 0617  Rapid urine drug screen (hospital performed)  ONCE - STAT,   STAT        02/26/23 0616            Vitals/Pain Today's Vitals   02/26/23 0945 02/26/23 1020 02/26/23 1313 02/26/23 1339  BP: (!) 140/62  126/82   Pulse: 80  77   Resp: 18  16   Temp:  97.8 F (36.6 C) 97.8 F (36.6 C)   TempSrc:  Oral Oral   SpO2: 100%  100%   PainSc:    6     Isolation Precautions No active isolations  Medications Medications  tamoxifen (NOLVADEX) tablet 20 mg (has no administration in time range)  losartan (COZAAR) tablet 50 mg (50 mg Oral Given 02/26/23 0924)  metoprolol tartrate (LOPRESSOR) tablet 50 mg (50 mg Oral Given 02/26/23 0925)  venlafaxine XR (EFFEXOR-XR) 24 hr capsule 37.5 mg (has no administration in time  range)  levothyroxine (SYNTHROID) tablet 100 mcg (100 mcg Oral Given 02/26/23 0924)  gabapentin (NEURONTIN) capsule 100 mg (100 mg Oral Given 02/26/23 0924)  Cholecalciferol 5,000 Units (has no administration in time range)  meloxicam (MOBIC) tablet 15 mg (has no administration in time range)  enoxaparin (LOVENOX) injection 30 mg (30 mg Subcutaneous Given 02/26/23 0927)  sodium chloride flush (NS) 0.9 % injection 3 mL (3 mLs Intravenous Given 02/26/23 1055)  acetaminophen (TYLENOL) tablet 650 mg (has no administration in time range)    Or  acetaminophen (TYLENOL) suppository 650 mg (has no administration in time range)  HYDROcodone-acetaminophen (NORCO/VICODIN) 5-325 MG per tablet 1-2 tablet (has no administration in time range)  morphine (PF) 2 MG/ML injection 1 mg (has no administration in time range)  traZODone (DESYREL) tablet 25 mg (has no administration in time range)  senna-docusate (Senokot-S) tablet 1 tablet (has no administration in time range)  bisacodyl (DULCOLAX) EC tablet 5 mg (has no administration in time range)  ondansetron (ZOFRAN) tablet 4 mg (has no administration in time range)    Or  ondansetron (ZOFRAN) injection 4 mg (has no administration in time range)  albuterol (PROVENTIL) (2.5 MG/3ML) 0.083% nebulizer solution 2.5 mg (has no administration in time range)  ipratropium (ATROVENT) nebulizer solution 0.5 mg (has no administration in time range)  hydrALAZINE (APRESOLINE) injection 5 mg (has no administration in time range)  insulin aspart (novoLOG) injection 0-15 Units (3 Units Subcutaneous Given 02/26/23 1312)  insulin aspart (novoLOG) injection 0-5 Units (has no administration in time range)  potassium chloride (KLOR-CON) packet 20 mEq (20 mEq Oral Given 02/26/23 1054)  vancomycin (VANCOREADY) IVPB 500 mg/100 mL (has no administration in time range)  leptospermum manuka honey (MEDIHONEY) paste 1 Application (has no administration in time range)  morphine (PF) 4 MG/ML  injection 4 mg (4 mg Intravenous Given 02/25/23 2332)  potassium chloride SA (KLOR-CON M) CR tablet 40 mEq (40 mEq Oral Given 02/25/23 2324)  iohexol (OMNIPAQUE) 300 MG/ML solution 80 mL (75 mLs Intravenous Contrast Given 02/25/23 2336)  vancomycin (VANCOCIN) IVPB 1000 mg/200 mL premix (0 mg Intravenous Stopped 02/26/23 0236)    Mobility walks with person assist  A&Ox4, on RA, VSS

## 2023-02-27 ENCOUNTER — Inpatient Hospital Stay (HOSPITAL_COMMUNITY): Payer: Medicaid Other

## 2023-02-27 DIAGNOSIS — L98499 Non-pressure chronic ulcer of skin of other sites with unspecified severity: Secondary | ICD-10-CM

## 2023-02-27 DIAGNOSIS — L03116 Cellulitis of left lower limb: Secondary | ICD-10-CM | POA: Diagnosis not present

## 2023-02-27 LAB — GLUCOSE, CAPILLARY
Glucose-Capillary: 240 mg/dL — ABNORMAL HIGH (ref 70–99)
Glucose-Capillary: 238 mg/dL — ABNORMAL HIGH (ref 70–99)
Glucose-Capillary: 201 mg/dL — ABNORMAL HIGH (ref 70–99)
Glucose-Capillary: 298 mg/dL — ABNORMAL HIGH (ref 70–99)

## 2023-02-27 LAB — BASIC METABOLIC PANEL
Anion gap: 7 (ref 5–15)
BUN: 11 mg/dL (ref 8–23)
CO2: 25 mmol/L (ref 22–32)
Calcium: 8.4 mg/dL — ABNORMAL LOW (ref 8.9–10.3)
Chloride: 103 mmol/L (ref 98–111)
Creatinine, Ser: 0.83 mg/dL (ref 0.44–1.00)
GFR, Estimated: >60 mL/min (ref >60)
Glucose, Bld: 205 mg/dL — ABNORMAL HIGH (ref 70–99)
Potassium: 4.3 mmol/L (ref 3.5–5.1)
Sodium: 135 mmol/L (ref 135–145)

## 2023-02-27 LAB — CBC
HCT: 36.4 % (ref 36.0–46.0)
Hemoglobin: 11.6 g/dL — ABNORMAL LOW (ref 12.0–15.0)
MCH: 29 pg (ref 26.0–34.0)
MCHC: 31.9 g/dL (ref 30.0–36.0)
MCV: 91 fL (ref 80.0–100.0)
Platelets: 233 10*3/uL (ref 150–400)
RBC: 4 MIL/uL (ref 3.87–5.11)
RDW: 13 % (ref 11.5–15.5)
WBC: 10.2 10*3/uL (ref 4.0–10.5)
nRBC: 0 % (ref 0.0–0.2)

## 2023-02-27 MED ORDER — GLUCERNA SHAKE PO LIQD
237.0000 mL | Freq: Three times a day (TID) | ORAL | Status: DC
  Administered 2023-02-27 – 2023-03-07 (×21): 237 mL via ORAL
  Filled 2023-02-27 (×4): qty 237

## 2023-02-27 MED ORDER — ADULT MULTIVITAMIN W/MINERALS CH
1.0000 | ORAL_TABLET | Freq: Every day | ORAL | Status: DC
  Administered 2023-02-28 – 2023-03-07 (×7): 1 via ORAL
  Filled 2023-02-27 (×8): qty 1

## 2023-02-27 MED ORDER — TAMOXIFEN CITRATE 10 MG PO TABS
20.0000 mg | ORAL_TABLET | Freq: Every day | ORAL | Status: DC
  Administered 2023-02-27 – 2023-03-07 (×8): 20 mg via ORAL
  Filled 2023-02-27 (×9): qty 2

## 2023-02-27 MED ORDER — ATORVASTATIN CALCIUM 40 MG PO TABS
40.0000 mg | ORAL_TABLET | Freq: Every day | ORAL | Status: DC
  Administered 2023-02-28 – 2023-03-07 (×7): 40 mg via ORAL
  Filled 2023-02-27 (×7): qty 1

## 2023-02-27 MED ORDER — ATORVASTATIN CALCIUM 40 MG PO TABS: 40.0000 mg | ORAL_TABLET | Freq: Every day | ORAL | Status: DC

## 2023-02-27 MED ORDER — ASPIRIN 81 MG PO TBEC: 81.0000 mg | DELAYED_RELEASE_TABLET | Freq: Every day | ORAL | Status: DC

## 2023-02-27 MED ORDER — SODIUM CHLORIDE 0.9 % IV BOLUS
1000.0000 mL | Freq: Once | INTRAVENOUS | Status: AC
  Administered 2023-02-27: 1000 mL via INTRAVENOUS

## 2023-02-27 MED ORDER — ASPIRIN 81 MG PO TBEC
81.0000 mg | DELAYED_RELEASE_TABLET | Freq: Every day | ORAL | Status: DC
  Administered 2023-02-27 – 2023-03-07 (×8): 81 mg via ORAL
  Filled 2023-02-27 (×8): qty 1

## 2023-02-27 NOTE — Progress Notes (Signed)
[facility] Progress Note Patient: Martha Lozano UJW:[id number] DOB: [birth year] DOA: 02/25/2023  DOS: the patient was seen and examined on 02/27/2023  Brief hospital course: PMH of breast cancer, type II DM, neuropathy, hep C, substance abuse presents to the hospital with complaints of left foot pain. Found to has bilateral PAD, left leg cellulitis with small abscess without any evidence of osteomyelitis or septic arthritis. Vascular surgery consulted for PAD.  Assessment and Plan: Left leg cellulitis. MRI negative for any osteomyelitis. Focal skin ulceration seen on the proximal phalanx. No evidence of septic arthritis as well. Small 16 mm subcutaneous fluid collection seen on CT scan. Bilateral ABI 0.38 on right and 0.48 on the left.  TBI are absent bilaterally.  Absent waveform on bilateral great toe.  Monophasic waveform in DP and PTA. Vascular surgery consult for further evaluation. Check lipid panel in the morning. Aspirin 81 mg a day. Follow-up on blood cultures. Continue current IV antibiotic. Discussed with podiatry Dr. Lilian Kapur.  Outpatient follow-up recommended.  Hypokalemia.  Uncontrolled type 2 diabetes mellitus with hyperglycemia without long-term insulin use with diabetic neuropathy. Hemoglobin A1c 13. On glipizide and metformin at home. Currently on sliding scale insulin. Continue gabapentin for neuropathy. Monitor.  Chronic pain syndrome. Patient used to see pain clinic for Suboxone therapy. Has not seen them since May 2024. Outpatient follow-up recommended.  Hypothyroidism. Continue Synthroid.  HTN. Currently blood pressure soft. Hold blood pressure medication and giving IV fluid boluses.  Mood disorder. Continue Effexor.  Polly substance abuse. UDS positive for opiates and cocaine. Patient did have opioids prior to UDS in the hospital. Monitor.  Avoid beta-blockers.  Underweight. BMI 16.6. Placing the patient at high risk for poor  outcome.  Continue supplements.  Subjective: Pain well-controlled.  No nausea or vomiting no fever no chills.  Blood pressure is soft but denies any acute complaint of dizziness or lightheadedness.  No chest pain.  Physical Exam: General: in Mild distress, No Rash Cardiovascular: S1 and S2 Present, No Murmur Respiratory: Good respiratory effort, Bilateral Air entry present. No Crackles, No wheezes Abdomen: Bowel Sound present, No tenderness Extremities: No edema, significantly improving left leg redness.  Erythema and warmth is not present on forefoot anymore and only limited to below the ankle area. Neuro: Alert and oriented x3, no new focal deficit  Data Reviewed: I have Reviewed nursing notes, Vitals, and Lab results. Since last encounter, pertinent lab results CBC and BMP   . I have ordered test including CBC and BMP  . I have discussed pt's care plan and test results with vascular surgery and podiatry Dr. Lilian Kapur  .   Disposition: Status is: Inpatient Remains inpatient appropriate because: Requiring IV antibiotic, culture clearance, vascular evaluation.  enoxaparin (LOVENOX) injection 30 mg Start: 02/26/23 1000 SCDs Start: 02/26/23 0711   Family Communication: No one at bedside Level of care: Med-Surg   Vitals:   02/26/23 2333 02/27/23 0438 02/27/23 1103 02/27/23 1306  BP: (!) 92/56 (!) 96/55 (!) 87/51 114/73  Pulse: 76 71 75 85  Resp: 16 16  18   Temp: 98 F (36.7 C) 98.2 F (36.8 C)  97.8 F (36.6 C)  TempSrc: Oral     SpO2: 99% 100%  100%     Author: Lynden Oxford, MD 02/27/2023 5:49 PM  Please look on [email or web address] to find out who is on call.

## 2023-02-27 NOTE — Plan of Care (Signed)

## 2023-02-27 NOTE — Progress Notes (Signed)
ABI has been completed.     Results can be found under chart review under CV PROC. 02/27/2023 2:45 PM Dwayne Begay RVT, RDMS

## 2023-02-28 DIAGNOSIS — L97422 Non-pressure chronic ulcer of left heel and midfoot with fat layer exposed: Secondary | ICD-10-CM

## 2023-02-28 DIAGNOSIS — E876 Hypokalemia: Secondary | ICD-10-CM

## 2023-02-28 DIAGNOSIS — E871 Hypo-osmolality and hyponatremia: Secondary | ICD-10-CM | POA: Diagnosis not present

## 2023-02-28 DIAGNOSIS — E43 Unspecified severe protein-calorie malnutrition: Secondary | ICD-10-CM | POA: Insufficient documentation

## 2023-02-28 DIAGNOSIS — L03116 Cellulitis of left lower limb: Secondary | ICD-10-CM | POA: Diagnosis not present

## 2023-02-28 LAB — GLUCOSE, CAPILLARY
Glucose-Capillary: 138 mg/dL — ABNORMAL HIGH (ref 70–99)
Glucose-Capillary: 269 mg/dL — ABNORMAL HIGH (ref 70–99)
Glucose-Capillary: 158 mg/dL — ABNORMAL HIGH (ref 70–99)
Glucose-Capillary: 327 mg/dL — ABNORMAL HIGH (ref 70–99)

## 2023-02-28 LAB — LIPID PANEL
Cholesterol: 100 mg/dL (ref 0–200)
HDL: 35 mg/dL — ABNORMAL LOW (ref >40)
LDL Cholesterol: 52 mg/dL (ref 0–99)
Total CHOL/HDL Ratio: 2.9 ratio
Triglycerides: 64 mg/dL (ref <150)
VLDL: 13 mg/dL (ref 0–40)

## 2023-02-28 LAB — VAS US ABI WITH/WO TBI
Left ABI: 0.48
Right ABI: 0.38

## 2023-02-28 MED ORDER — INSULIN STARTER KIT- PEN NEEDLES (ENGLISH)
1.0000 | Freq: Once | Status: AC
  Administered 2023-02-28: 1
  Filled 2023-02-28: qty 1

## 2023-02-28 MED ORDER — INSULIN GLARGINE-YFGN 100 UNIT/ML ~~LOC~~ SOLN
7.0000 [IU] | Freq: Every day | SUBCUTANEOUS | Status: DC
  Administered 2023-02-28: 7 [IU] via SUBCUTANEOUS
  Filled 2023-02-28 (×3): qty 0.07

## 2023-02-28 MED ORDER — ZINC SULFATE 220 (50 ZN) MG PO CAPS
220.0000 mg | ORAL_CAPSULE | Freq: Every day | ORAL | Status: DC
  Administered 2023-02-28 – 2023-03-07 (×7): 220 mg via ORAL
  Filled 2023-02-28 (×7): qty 1

## 2023-02-28 MED ORDER — LIVING WELL WITH DIABETES BOOK
Freq: Once | Status: AC
  Filled 2023-02-28: qty 1

## 2023-02-28 MED ORDER — VITAMIN C 500 MG PO TABS
500.0000 mg | ORAL_TABLET | Freq: Two times a day (BID) | ORAL | Status: DC
  Administered 2023-02-28 – 2023-03-07 (×13): 500 mg via ORAL
  Filled 2023-02-28 (×13): qty 1

## 2023-02-28 NOTE — Progress Notes (Signed)
[facility]                                                                              Christ Erlich, is a 62 y.o. female, DOB - 01-16-1961, ZOX:[id number] Admit date - 02/25/2023    Outpatient Primary MD for the patient is Patient, [name] Per  LOS - 2  days  Chief Complaint  Patient presents with   Foot Pain       Brief summary    Patient is a 62 year old female with breast cancer, type II DM, neuropathy, hep C, substance abuse presents to the hospital with complaints of left foot pain. Found to has bilateral PAD, left leg cellulitis with small abscess without any evidence of osteomyelitis or septic arthritis. Vascular surgery consulted for PAD  Assessment & Plan    Principal Problem:   Cellulitis of left lower extremity, left foot nonhealing ulcer in the setting of uncontrolled diabetes mellitus, severe PAD -MRI negative for any osteomyelitis - Small 16 mm subcutaneous fluid collection seen on CT scan. - Bilateral ABI 0.38 on right and 0.48 on the left.  TBI are absent bilaterally.  Absent waveform on bilateral great toe.  Monophasic waveform in DP and PTA. -Continue IV vancomycin  -Blood cultures negative till date -Vascular surgery consulted, Dr Sherral Hammers will evaluate and recommended to transfer to [facility] for intervention -Lipid panel showed LDL 52, cholesterol 100, TG 64 -Continue aspirin 81 mg daily  Active problems Hypokalemia -Replete as needed   Diabetes mellitus type 2, uncontrolled with hyperglycemia, NIDDM diabetic foot ulcer, severe PAD  -Hemoglobin A1c 13.1 - On glipizide and metformin at home, on hold.  CBG (last 3)  Recent Labs    02/27/23 2151 02/28/23 0751 02/28/23 1205  GLUCAP 298* 138* 269*   -CBGs elevated, add Semglee 7 units at bedtime, continue SSI, moderate    Chronic pain syndrome. - Patient used to see pain clinic for Suboxone therapy, has not seen them since May 2024. Outpatient follow-up recommended.    Hypothyroidism. -Continue Synthroid. -TSH 5.8 in 2015, will recheck in am   Essential hypertension -BP soft, hold oral antihypertensives    Mood disorder. Continue Effexor.    Substance abuse  UDS positive for opiates and cocaine. Patient did have opioids prior to UDS in the hospital.  Avoid beta-blockers.   Severe protein calorie malnutrition, underweight  Etiology: social / environmental circumstances (polysubstance abuse) Signs/Symptoms: severe fat depletion, severe muscle depletion Interventions: MVI, Glucerna shake, Refer to RD note for recommendations  Estimated body mass index is 16.6 kg/m as calculated from the following:   Height as of an earlier encounter on 02/25/23: 5' (1.524 m).   Weight as of an earlier encounter on 02/25/23: 38.6 kg.  Code Status: Full code DVT Prophylaxis:  enoxaparin (LOVENOX) injection 30 mg Start: 02/26/23 1000 SCDs Start: 02/26/23 0711   Level of Care: Level of care: Med-Surg Family Communication: No family member at the bedside Disposition Plan:      Remains inpatient appropriate: Transfer to [facility] for vascular surgery evaluation and intervention   Procedures:    Consultants:   Vascular surgery  Antimicrobials:  Anti-infectives (From admission, onward)    Start     Dose/Rate Route Frequency Ordered Stop   02/27/23 0200  vancomycin (VANCOREADY) IVPB 500 mg/100 mL        500 mg 100 mL/hr over 60 Minutes Intravenous Every 24 hours 02/26/23 0246     02/26/23 0130  vancomycin (VANCOCIN) IVPB 1000 mg/200 mL premix        1,000 mg 200 mL/hr over 60 Minutes Intravenous  Once 02/26/23 0116 02/26/23 0236          Medications  ascorbic acid  500 mg Oral BID   aspirin EC  81 mg Oral Daily   atorvastatin  40 mg Oral Daily   cholecalciferol  5,000 Units Oral Daily   enoxaparin (LOVENOX) injection  30 mg Subcutaneous Daily   feeding supplement (GLUCERNA SHAKE)  237 mL Oral TID BM   gabapentin  100 mg Oral TID   insulin  aspart  0-15 Units Subcutaneous TID WC   insulin aspart  0-5 Units Subcutaneous QHS   leptospermum manuka honey  1 Application Topical Daily   levothyroxine  100 mcg Oral QAC breakfast   multivitamin with minerals  1 tablet Oral Daily   sodium chloride flush  3 mL Intravenous Q12H   tamoxifen  20 mg Oral Daily   zinc sulfate  220 mg Oral Daily      Subjective:   Kwame Carro was seen and examined today.  2 nonhealing wounds on the left foot, + left foot pain.  Patient denies dizziness, chest pain, shortness of breath, abdominal pain, N/V.  No fevers, no acute events overnight. Objective:   Vitals:   02/27/23 1306 02/27/23 1938 02/28/23 0623 02/28/23 1325  BP: 114/73 106/61 129/64 107/72  Pulse: 85 76 78 79  Resp: 18 17 18 18   Temp: 97.8 F (36.6 C) 98 F (36.7 C) 97.8 F (36.6 C) 98.4 F (36.9 C)  TempSrc:  Oral Oral Oral  SpO2: 100% 100% 100% 100%    Intake/Output Summary (Last 24 hours) at 02/28/2023 1406 Last data filed at 02/27/2023 2051 Gross per 24 hour  Intake 240 ml  Output --  Net 240 ml     Wt Readings from Last 3 Encounters:  02/25/23 38.6 kg  02/06/23 49 kg  11/20/21 49.2 kg     Exam General: Alert and oriented x 3, NAD Cardiovascular: S1 S2 auscultated,  RRR Respiratory: Clear to auscultation bilaterally Gastrointestinal: Soft, nontender, nondistended, + bowel sounds Ext: no pedal edema bilaterally Neuro: no new deficits Skin: See below Psych: Normal affect   Pics taken on 02/28/23: Left heel    Pics taken on 02/28/23 (Left foot at base of great toe)     Data Reviewed:  I have personally reviewed following labs    CBC Lab Results  Component Value Date   WBC 10.2 02/27/2023   RBC 4.00 02/27/2023   HGB 11.6 (L) 02/27/2023   HCT 36.4 02/27/2023   MCV 91.0 02/27/2023   MCH 29.0 02/27/2023   PLT 233 02/27/2023   MCHC 31.9 02/27/2023   RDW 13.0 02/27/2023   LYMPHSABS 3.3 02/26/2023   MONOABS 1.3 (H) 02/26/2023   EOSABS 0.3  02/26/2023   BASOSABS 0.1 02/26/2023     Last metabolic panel Lab Results  Component Value Date   NA 135 02/27/2023   K 4.3 02/27/2023   CL 103 02/27/2023   CO2 25 02/27/2023   BUN 11 02/27/2023   CREATININE 0.83 02/27/2023   GLUCOSE 205 (H)  02/27/2023   GFRNONAA >60 02/27/2023   GFRAA >89 03/09/2014   CALCIUM 8.4 (L) 02/27/2023   PROT 6.9 02/26/2023   ALBUMIN 3.0 (L) 02/26/2023   BILITOT 0.5 02/26/2023   ALKPHOS 84 02/26/2023   AST 27 02/26/2023   ALT 24 02/26/2023   ANIONGAP 7 02/27/2023    CBG (last 3)  Recent Labs    02/27/23 2151 02/28/23 0751 02/28/23 1205  GLUCAP 298* 138* 269*      Coagulation Profile: No results for input(s): "INR", "PROTIME" in the last 168 hours.   Radiology Studies: I have personally reviewed the imaging studies  VAS [place] ABI WITH/WO TBI  Result Date: 02/28/2023  LOWER EXTREMITY DOPPLER STUDY Patient Name:  Martha Lozano  Date of Exam:   02/27/2023 Medical Rec #: [id number]         Accession #:    [id number] Date of Birth: March 26, 1961        Patient Gender: F Patient Age:   82 years Exam Location:  [facility] Procedure:      VAS [place] ABI WITH/WO TBI Referring Phys: PRANAV PATEL --------------------------------------------------------------------------------  Indications: Ulceration of left foot High Risk Factors: Diabetes, current smoker, polysubstance abuse.  Limitations: Today's exam was limited due to continuous patient movement. Comparison Study: No previous exams Performing Technologist: Hill, Jody RVT, RDMS  Examination Guidelines: A complete evaluation includes at minimum, Doppler waveform signals and systolic blood pressure reading at the level of bilateral brachial, anterior tibial, and posterior tibial arteries, when vessel segments are accessible. Bilateral testing is considered an integral part of a complete examination. Photoelectric Plethysmograph (PPG) waveforms and toe systolic pressure readings are included as required  and additional duplex testing as needed. Limited examinations for reoccurring indications may be performed as noted.  ABI Findings: +---------+------------------+-----+----------+--------+ Right    Rt Pressure (mmHg)IndexWaveform  Comment  +---------+------------------+-----+----------+--------+ Brachial 104                    triphasic          +---------+------------------+-----+----------+--------+ PTA      37                0.36 monophasic         +---------+------------------+-----+----------+--------+ DP       39                0.38 monophasic         +---------+------------------+-----+----------+--------+ Great Toe0                 0.00 Absent             +---------+------------------+-----+----------+--------+ +---------+------------------+-----+----------+-------+ Left     Lt Pressure (mmHg)IndexWaveform  Comment +---------+------------------+-----+----------+-------+ Brachial 93                     triphasic         +---------+------------------+-----+----------+-------+ PTA      50                0.48 monophasic        +---------+------------------+-----+----------+-------+ DP       48                0.46 monophasic        +---------+------------------+-----+----------+-------+ Great Toe0                 0.00 Absent            +---------+------------------+-----+----------+-------+ +-------+-----------+-----------+------------+------------+ ABI/TBIToday's ABIToday's TBIPrevious ABIPrevious TBI +-------+-----------+-----------+------------+------------+ Right  0.38       absent                              +-------+-----------+-----------+------------+------------+ Left   0.48       absent                              +-------+-----------+-----------+------------+------------+  Summary: Right: Resting right ankle-brachial index indicates severe right lower extremity arterial disease. The right toe-brachial index is absent. Left:  Resting left ankle-brachial index indicates severe left lower extremity arterial disease. The left toe-brachial index is absent. *See table(s) above for measurements and observations.  Suggest Peripheral Vascular Consult. Electronically signed by Sherald Hess MD on 02/28/2023 at 9:45:32 AM.    Final        Thad Ranger M.D. [facility] 02/28/2023, 2:06 PM  Available via Epic secure chat 7am-7pm After 7 pm, please refer to night coverage provider listed on amion.

## 2023-02-28 NOTE — Progress Notes (Signed)
   02/28/23 1510  TOC Brief Assessment  Insurance and Status Reviewed  Patient has primary care physician Yes (Assigned Medicaid PCP)  Home environment has been reviewed Home  Prior level of function: Independent  Prior/Current Home Services No current home services  Social Determinants of Health Reivew SDOH reviewed no interventions necessary  Readmission risk has been reviewed Yes  Transition of care needs no transition of care needs at this time

## 2023-02-28 NOTE — Progress Notes (Signed)
Initial Nutrition Assessment  DOCUMENTATION CODES:   Severe malnutrition in context of social or environmental circumstances, Underweight  INTERVENTION:   -Glucerna Shake po TID, each supplement provides 220 kcal and 10 grams of protein   -500  mg Vitamin C BID -220 mg Zinc sulfate daily x 14 days  NUTRITION DIAGNOSIS:   Severe Malnutrition related to social / environmental circumstances (polysubstance abuse) as evidenced by severe fat depletion, severe muscle depletion.  GOAL:   Patient will meet greater than or equal to 90% of their needs  MONITOR:   PO intake, Supplement acceptance, Labs, Weight trends, I & O's, Skin  REASON FOR ASSESSMENT:   Consult Assessment of nutrition requirement/status  ASSESSMENT:   62 y.o. female with a known history of breast cancer, diabetes with neuropathy, hepatitis C, substance use presents to the emergency department for evaluation of left foot pain.  Patient in room, lying in bed. Did not answer most of my questions. States she does like the supplements she is getting. Has a chocolate Glucerna at bedside, was not interested in drinking at this time even though reports she is very hungry.   Will add Vitamin C and Zinc to aid in wound healing.   Per weight records, pt has lost 24 lbs since May 2023. Pt was unable to tell me UBW.  Medications: Vitamin D  Labs reviewed: CBGs: 138-298  UDS+ cocaine, opiates  NUTRITION - FOCUSED PHYSICAL EXAM:  Flowsheet Row Most Recent Value  Orbital Region Severe depletion  Upper Arm Region Severe depletion  Thoracic and Lumbar Region Unable to assess  Buccal Region Moderate depletion  Temple Region Severe depletion  Clavicle Bone Region Unable to assess  Clavicle and Acromion Bone Region Unable to assess  Scapular Bone Region Unable to assess  Dorsal Hand Unable to assess  Patellar Region Severe depletion  Anterior Thigh Region Severe depletion  Posterior Calf Region Severe depletion  Edema  (RD Assessment) None  Hair Reviewed  Eyes Unable to assess  Mouth Unable to assess  Skin Reviewed       Diet Order:   Diet Order             Diet Carb Modified Fluid consistency: Thin; Room service appropriate? Yes  Diet effective now                   EDUCATION NEEDS:   Not appropriate for education at this time  Skin:  Skin Assessment: Skin Integrity Issues: Skin Integrity Issues:: Diabetic Ulcer Diabetic Ulcer: left great toe, left heel  Last BM:  8/14  Height:   Ht Readings from Last 1 Encounters:  02/25/23 5' (1.524 m)    Weight:   Wt Readings from Last 1 Encounters:  02/25/23 38.6 kg    BMI:  16.4 kg/m^2  Estimated Nutritional Needs:   Kcal:  1450-1650  Protein:  70-95g  Fluid:  1.7L/day   Tilda Franco, MS, RD, LDN Inpatient Clinical Dietitian Contact information available via Amion

## 2023-02-28 NOTE — Progress Notes (Signed)
Ist attempt to give report to moses and [facility] [address].

## 2023-02-28 NOTE — Progress Notes (Signed)
Mobility Specialist - Progress Note   02/28/23 1005  Mobility  Activity Ambulated with assistance to bathroom  Level of Assistance Standby assist, set-up cues, supervision of patient - no hands on  Assistive Device Front wheel walker  Distance Ambulated (ft) 20 ft  Range of Motion/Exercises Active  Activity Response Tolerated well  Mobility Referral Yes  $Mobility charge 1 Mobility  Mobility Specialist Start Time (ACUTE ONLY) 0955  Mobility Specialist Stop Time (ACUTE ONLY) 1003  Mobility Specialist Time Calculation (min) (ACUTE ONLY) 8 min   Pt received in bed and agreed to ambulate to restroom denied hallway, no issues and returned to bed with all needs met.  Marilynne Halsted Mobility Specialist

## 2023-02-28 NOTE — Progress Notes (Signed)
[facility] received from [facility] at 11835Pm . Alert and oriented

## 2023-02-28 NOTE — Inpatient Diabetes Management (Signed)
Inpatient Diabetes Program Recommendations  AACE/ADA: New Consensus Statement on Inpatient Glycemic Control (2015)  Target Ranges:  Prepandial:   less than 140 mg/dL      Peak postprandial:   less than 180 mg/dL (1-2 hours)      Critically ill patients:  140 - 180 mg/dL   Lab Results  Component Value Date   GLUCAP 269 (H) 02/28/2023   HGBA1C 13.1 (H) 02/26/2023    Review of Glycemic Control  Diabetes history: DM2 Outpatient Diabetes medications: metformin 500 mg every day (not taking), glipizide 5 mg every day (not taking) Current orders for Inpatient glycemic control: Semglee 7 units at bedtime, Novolog 0-15 TID with meals and 0-5 HS  HgbA1C - 13.1%  Inpatient Diabetes Program Recommendations:    Pt is willing to go home on insulin, if needed.   To start Semglee 7 units at bedtime tonight  Consider decreasing Novolog to 0-9 TID with meals and 0-5 HS  Add Novolog 3 units TID with meals if eating > 50%  Pt states she has used pens in the past and would be fine with going home on insulin. Demonstrated insulin pen use. Will order insulin pen starter kit.  Discussed HgbA1C of 13.1% (average blood sugar of 329 mg/dL) and importance of controlling blood sugars to prevent complications. Pt could not recall last HgbA1C. Discussed importance of checking CBGs and maintaining good CBG control to prevent long-term and short-term complications. Explained how hyperglycemia leads to damage within blood vessels which lead to the common complications seen with uncontrolled diabetes. Stressed to the patient the importance of improving glycemic control to prevent further complications from uncontrolled diabetes. Discussed impact of nutrition, exercise, stress, sickness, and medications on diabetes control.  Will need to f/u with PCP/Endo for diabetes management. Needs tight control for healing wounds on foot. Pt states she has meter at home and will check blood sugars at least 2-3x/day and take  logbook to PCP for review.  Will check in with pt in am to see if she has any questions/concerns regarding her diabetes control.   Continue to follow.   Thank you. Ailene Ards, RD, LDN, CDCES Inpatient Diabetes Coordinator [phone]

## 2023-02-28 NOTE — Consult Note (Signed)
Hospital Consult    Reason for Consult: Left lower extremity wounds with nonpalpable pulse Requesting Physician: Hospital medicine MRN #:  [id number]  History of Present Illness: This is a 62 y.o. female who presented to the emergency department with a 1 month history of left lower extremity wounds with a nonpalpable pulse.  The wounds are present on the left fifth toe, with appreciated purulence, as well as left heel.  On exam, Javonni was doing well.  A native of [place], she has lived there her entire life.  She has a longstanding history of type 2 diabetes, with current A1c 13.  She notes the wounds have been present for roughly a month.  She is not sure if she stepped on something, or how they occurred.  Notes some purulence from the left first toe.  Denies fevers, chills denies rest pain.  Past Medical History:  Diagnosis Date   Breast cancer, stage 0, right    pt currently taking Tamoxifen   Diabetes mellitus without complication (HCC)    Family history of breast cancer    Family history of prostate cancer    Family history of skin cancer    Hep C w/o coma, chronic (HCC)    Hx of hepatitis C    pt received treatment for Hep C in 2018   Sciatic pain     Past Surgical History:  Procedure Laterality Date   BREAST BIOPSY Right 09/12/2017   INCISION AND DRAINAGE ABSCESS Left    hand    No Known Allergies  Prior to Admission medications   Medication Sig Start Date End Date Taking? Authorizing Provider  atorvastatin (LIPITOR) 20 MG tablet Take 1 tablet (20 mg total) by mouth daily. 11/15/20  Yes Serena Croissant, MD  Buprenorphine HCl-Naloxone HCl (SUBOXONE) 12-3 MG FILM Place 1 Film under the tongue 2 (two) times daily.    Yes [provider]  doxycycline (VIBRAMYCIN) 100 MG capsule Take 1 capsule (100 mg total) by mouth 2 (two) times daily. 02/06/23  Yes Fayrene Helper, PA-C  naloxone [facility]) nasal spray 4 mg/0.1 mL SMARTSIG:Both Nares 11/08/20  Yes [provider]   gabapentin (NEURONTIN) 100 MG capsule Take 100 mg by mouth 3 (three) times daily. Patient not taking: Reported on 02/26/2023 11/22/20   [provider]  glipiZIDE (GLUCOTROL XL) 5 MG 24 hr tablet Take 1 tablet (5 mg total) by mouth daily with breakfast. Patient not taking: Reported on 02/26/2023 11/06/18   Serena Croissant, MD  levothyroxine (SYNTHROID) 100 MCG tablet Take 1 tablet (100 mcg total) by mouth daily before breakfast. Patient not taking: Reported on 02/26/2023 11/15/20   Serena Croissant, MD  losartan (COZAAR) 50 MG tablet Take 1 tablet (50 mg total) by mouth daily. Patient not taking: Reported on 02/26/2023 11/10/18   Serena Croissant, MD  meloxicam (MOBIC) 15 MG tablet Take 1 tablet by mouth daily. Patient not taking: Reported on 02/26/2023 11/21/20   [provider]  metFORMIN (GLUCOPHAGE) 500 MG tablet Take 1 tablet (500 mg total) by mouth daily with breakfast. Patient not taking: Reported on 02/26/2023 03/02/14   Doris Cheadle, MD  metoprolol tartrate (LOPRESSOR) 50 MG tablet TAKE 1 TABLET BY MOUTH EVERY DAY Patient not taking: Reported on 02/26/2023 11/11/18   Serena Croissant, MD  venlafaxine XR (EFFEXOR-XR) 37.5 MG 24 hr capsule Take by mouth. Patient not taking: Reported on 02/26/2023 11/11/18   [provider]    Social History   Socioeconomic History   Marital status: Widowed  Spouse name: Not on file   Number of children: Not on file   Years of education: Not on file   Highest education level: Not on file  Occupational History   Not on file  Tobacco Use   Smoking status: Every Day    Current packs/day: 0.33    Average packs/day: 0.3 packs/day for 35.0 years (11.6 ttl pk-yrs)    Types: Cigarettes   Smokeless tobacco: Never  Vaping Use   Vaping status: Some Days   Substances: Nicotine  Substance and Sexual Activity   Alcohol use: Yes    Comment: Occasional   Drug use: Not Currently    Comment: history substance abuse    Sexual activity: Yes    Birth  control/protection: None  Other Topics Concern   Not on file  Social History Narrative   Not on file   Social Determinants of Health   Financial Resource Strain: Not on file  Food Insecurity: No Food Insecurity (02/26/2023)   Hunger Vital Sign    Worried About Running Out of Food in the Last Year: Never true    Ran Out of Food in the Last Year: Never true  Transportation Needs: No Transportation Needs (02/26/2023)   PRAPARE - Administrator, Civil Service (Medical): No    Lack of Transportation (Non-Medical): No  Physical Activity: Inactive (07/04/2021)   Received from [facility], [facility]   Exercise Vital Sign    Days of Exercise per Week: 0 days    Minutes of Exercise per Session: 0 min  Stress: No Stress Concern Present (07/04/2021)   Received from [facility], [facility] of Occupational Health - Occupational Stress Questionnaire    Feeling of Stress : Only a little  Social Connections: Not on file  Intimate Partner Violence: Not At Risk (02/26/2023)   Humiliation, Afraid, Rape, and Kick questionnaire    Fear of Current or Ex-Partner: No    Emotionally Abused: No    Physically Abused: No    Sexually Abused: No   Family History  Problem Relation Age of Onset   Diabetes Sister    Hypertension Sister    Breast cancer Sister 60   Diabetes Brother    Breast cancer Maternal Grandmother 51   Cervical cancer Mother    Prostate cancer Father 107       metastatic to bones   Cancer Maternal Aunt        type unk dx <50   Skin cancer Paternal Uncle    Pulmonary embolism Maternal Grandfather 55   Cancer Paternal Uncle    Breast cancer Other    Stomach cancer Other     ROS: Otherwise negative unless mentioned in HPI  Physical Examination  Vitals:   02/28/23 0623 02/28/23 1325  BP: 129/64 107/72  Pulse: 78 79  Resp: 18 18  Temp: 97.8 F (36.6 C) 98.4 F (36.9 C)  SpO2: 100% 100%   There is no height or weight on  file to calculate BMI.  General:  WDWN in NAD Gait: Not observed HENT: WNL, normocephalic Pulmonary: normal non-labored breathing, without Rales, rhonchi,  wheezing Cardiac: regular Abdomen: soft, NT/ND, no masses Skin: without rashes Vascular Exam/Pulses: 2+ femorals, nonpalpable pedal's Extremities: with ischemic changes, without Gangrene , without cellulitis; with open wounds;  Musculoskeletal: no muscle wasting or atrophy  Neurologic: A&O X 3;  No focal weakness or paresthesias are detected; speech is fluent/normal Psychiatric:  The pt  has Normal affect. Lymph:  Unremarkable  CBC    Component Value Date/Time   WBC 10.2 02/27/2023 0856   RBC 4.00 02/27/2023 0856   HGB 11.6 (L) 02/27/2023 0856   HCT 36.4 02/27/2023 0856   PLT 233 02/27/2023 0856   MCV 91.0 02/27/2023 0856   MCH 29.0 02/27/2023 0856   MCHC 31.9 02/27/2023 0856   RDW 13.0 02/27/2023 0856   LYMPHSABS 3.3 02/26/2023 1110   MONOABS 1.3 (H) 02/26/2023 1110   EOSABS 0.3 02/26/2023 1110   BASOSABS 0.1 02/26/2023 1110    BMET    Component Value Date/Time   NA 135 02/27/2023 0856   K 4.3 02/27/2023 0856   CL 103 02/27/2023 0856   CO2 25 02/27/2023 0856   GLUCOSE 205 (H) 02/27/2023 0856   BUN 11 02/27/2023 0856   CREATININE 0.83 02/27/2023 0856   CREATININE 0.71 03/09/2014 0916   CALCIUM 8.4 (L) 02/27/2023 0856   GFRNONAA >60 02/27/2023 0856   GFRNONAA >89 03/09/2014 0916   GFRAA >89 03/09/2014 0916    COAGS: No results found for: "INR", "PROTIME"   ASSESSMENT/PLAN: This is a 62 y.o. female who presents with left lower extremity critical limb ischemia with tissue loss at the first toe and heel.  Both have been present for roughly a month.  The left first toe wound appears to have some purulence from the MTP joint.  On physical exam, she had nonpalpable pulses in the feet.  ABI demonstrating severe peripheral arterial disease-no toe pressure.  Patient would benefit from transfer to [facility] to help  facilitate left lower extremity revascularization. Likely angiogram early next week.  After discussing risk and benefits of left lower extremity angiography in an effort to define and improve distal perfusion for wound healing, Ignacio elected to proceed.   Toe management pending revascularization.  Appreciate hospital medicine transfer and admission.  Will continue to follow closely. Plan for left lower extremity angiogram Monday.  Fara Olden MD MS [facility] [phone] 02/28/2023  5:46 PM

## 2023-03-01 DIAGNOSIS — L03116 Cellulitis of left lower limb: Secondary | ICD-10-CM | POA: Diagnosis not present

## 2023-03-01 LAB — GLUCOSE, CAPILLARY
Glucose-Capillary: 283 mg/dL — ABNORMAL HIGH (ref 70–99)
Glucose-Capillary: 284 mg/dL — ABNORMAL HIGH (ref 70–99)
Glucose-Capillary: 272 mg/dL — ABNORMAL HIGH (ref 70–99)

## 2023-03-01 LAB — BASIC METABOLIC PANEL
Anion gap: 13 (ref 5–15)
BUN: 8 mg/dL (ref 8–23)
CO2: 28 mmol/L (ref 22–32)
Calcium: 8.6 mg/dL — ABNORMAL LOW (ref 8.9–10.3)
Chloride: 100 mmol/L (ref 98–111)
Creatinine, Ser: 0.92 mg/dL (ref 0.44–1.00)
GFR, Estimated: >60 mL/min (ref >60)
Glucose, Bld: 332 mg/dL — ABNORMAL HIGH (ref 70–99)
Potassium: 3.5 mmol/L (ref 3.5–5.1)
Sodium: 141 mmol/L (ref 135–145)

## 2023-03-01 LAB — CBC
HCT: 36.6 % (ref 36.0–46.0)
Hemoglobin: 11.7 g/dL — ABNORMAL LOW (ref 12.0–15.0)
MCH: 28.9 pg (ref 26.0–34.0)
MCHC: 32 g/dL (ref 30.0–36.0)
MCV: 90.4 fL (ref 80.0–100.0)
Platelets: 201 10*3/uL (ref 150–400)
RBC: 4.05 MIL/uL (ref 3.87–5.11)
RDW: 13.1 % (ref 11.5–15.5)
WBC: 6.2 10*3/uL (ref 4.0–10.5)
nRBC: 0 % (ref 0.0–0.2)

## 2023-03-01 LAB — TSH: TSH: 1.049 u[IU]/mL (ref 0.350–4.500)

## 2023-03-01 MED ORDER — LOSARTAN POTASSIUM 25 MG PO TABS
25.0000 mg | ORAL_TABLET | Freq: Every day | ORAL | Status: DC
  Administered 2023-03-01 – 2023-03-07 (×6): 25 mg via ORAL
  Filled 2023-03-01 (×6): qty 1

## 2023-03-01 MED ORDER — INSULIN GLARGINE-YFGN 100 UNIT/ML ~~LOC~~ SOLN
10.0000 [IU] | Freq: Every day | SUBCUTANEOUS | Status: DC
  Administered 2023-03-01 – 2023-03-02 (×2): 10 [IU] via SUBCUTANEOUS
  Filled 2023-03-01 (×3): qty 0.1

## 2023-03-01 NOTE — Plan of Care (Signed)

## 2023-03-01 NOTE — Progress Notes (Addendum)
Progress Note   Patient: Martha Lozano DGL:[id number] DOB: [birth year] DOA: 02/25/2023     3 DOS: the patient was seen and examined on 03/01/2023   Brief hospital course:  Patient is a 62 year old female with breast cancer, type II DM, neuropathy, hep C, substance abuse presents to the hospital with complaints of left foot pain. Found to has bilateral PAD with nonhealing left foot ulcer., left leg cellulitis with small abscess without any evidence of osteomyelitis or septic arthritis. Vascular surgery consulted for PAD  Assessment and Plan:   Cellulitis of left lower extremity, left foot nonhealing ulcer (base of the left great toe and left heel )in the setting of uncontrolled diabetes mellitus, severe PAD -MRI negative for any osteomyelitis - Small 16 mm subcutaneous fluid collection seen on CT scan. - Bilateral ABI 0.38 on right and 0.48 on the left.  TBI are absent bilaterally.  Absent waveform on bilateral great toe.  Monophasic waveform in DP and PTA. -Continue IV vancomycin  -Blood cultures negative till date -Vascular surgery consulted and patient is scheduled for left lower extremity angiogram on 03/04/23 for revascularization. -Continue aspirin 81 mg daily   Active problems Hypokalemia -Replete as needed   Diabetes mellitus type 2, uncontrolled with hyperglycemia, NIDDM diabetic foot ulcer, severe PAD  -Hemoglobin A1c 13.1 - On glipizide and metformin at home, on hold. -Will need insulin on discharge -Increase dose of Semglee to 10 units at bedtime.  Continue sliding scale coverage   CBG (last 3)  Recent Labs (last 2 labs)       Recent Labs    02/27/23 2151 02/28/23 0751 02/28/23 1205  GLUCAP 298* 138* 269*          Chronic pain syndrome. - Patient used to see pain clinic for Suboxone therapy, has not seen them since May 2024. Outpatient follow-up recommended.   Hypothyroidism. -Continue Synthroid. -TSH 5.8 in 2015, will recheck in am   Essential  hypertension -Normotensive -Resume losartan but at a lower dose.   Mood disorder. Continue Effexor.     Substance abuse  UDS positive for opiates and cocaine. Patient did have opioids prior to UDS in the hospital.  Avoid beta-blockers.   Severe protein calorie malnutrition, underweight  Etiology: social / environmental circumstances (polysubstance abuse) Signs/Symptoms: severe fat depletion, severe muscle depletion Interventions: MVI, Glucerna shake, Refer to RD note for recommendations   Estimated body mass index is 16.6 kg/m as calculated from the following:   Height as of an earlier encounter on 02/25/23: 5' (1.524 m).   Weight as of an earlier encounter on 02/25/23: 38.6 kg.    Nicotine Dependence Smoking cessation has been discussed with patient in detail. She declined a nicotine transdermal patch.    Subjective: Patient is seen and examined at the bedside.  No new complaints  Physical Exam: Vitals:   02/28/23 1834 02/28/23 1953 03/01/23 0323 03/01/23 0819  BP: (!) 136/103 122/72 128/78 129/66  Pulse: 97 96 80 88  Resp:  16 17   Temp: 98.4 F (36.9 C) 98.2 F (36.8 C) 97.8 F (36.6 C) 98.1 F (36.7 C)  TempSrc: Oral Oral Oral   SpO2: 99% 100% 100% 98%   General: Alert and oriented x 3, NAD Cardiovascular: S1 S2 auscultated,  RRR Respiratory: Clear to auscultation bilaterally Gastrointestinal: Soft, nontender, nondistended, + bowel sounds Ext: no pedal edema bilaterally Neuro: no new deficits Skin: See below Psych: Normal affect    Pics taken on 02/28/23: Left heel     Pics  taken on 02/28/23 (Left foot at base of great toe)         Data Reviewed: Labs reviewed.  Persistent hyperglycemia There are no new results to review at this time.  Family Communication: Plan of care discussed with patient at the bedside.  She verbalizes understanding and agrees to the treatment plan.  Disposition: Status is: Inpatient Remains inpatient appropriate because:  Scheduled for left lower extremity angiogram on 03/04/23  Planned Discharge Destination: Home    Time spent: 35 minutes  Author: Lucile Shutters, MD 03/01/2023 2:45 PM  For on call review [email or web address].

## 2023-03-01 NOTE — Progress Notes (Signed)
  Progress Note    03/01/2023 8:01 AM * No surgery found *  Subjective:  no complaints    Vitals:   02/28/23 1953 03/01/23 0323  BP: 122/72 128/78  Pulse: 96 80  Resp: 16 17  Temp: 98.2 F (36.8 C) 97.8 F (36.6 C)  SpO2: 100% 100%    Physical Exam: General:  resting comfortably Cardiac:  regular Extremities:  left foot wrapped and dry   CBC    Component Value Date/Time   WBC 10.2 02/27/2023 0856   RBC 4.00 02/27/2023 0856   HGB 11.6 (L) 02/27/2023 0856   HCT 36.4 02/27/2023 0856   PLT 233 02/27/2023 0856   MCV 91.0 02/27/2023 0856   MCH 29.0 02/27/2023 0856   MCHC 31.9 02/27/2023 0856   RDW 13.0 02/27/2023 0856   LYMPHSABS 3.3 02/26/2023 1110   MONOABS 1.3 (H) 02/26/2023 1110   EOSABS 0.3 02/26/2023 1110   BASOSABS 0.1 02/26/2023 1110    BMET    Component Value Date/Time   NA 135 02/27/2023 0856   K 4.3 02/27/2023 0856   CL 103 02/27/2023 0856   CO2 25 02/27/2023 0856   GLUCOSE 205 (H) 02/27/2023 0856   BUN 11 02/27/2023 0856   CREATININE 0.83 02/27/2023 0856   CREATININE 0.71 03/09/2014 0916   CALCIUM 8.4 (L) 02/27/2023 0856   GFRNONAA >60 02/27/2023 0856   GFRNONAA >89 03/09/2014 0916   GFRAA >89 03/09/2014 0916    INR No results found for: "INR"   Intake/Output Summary (Last 24 hours) at 03/01/2023 0801 Last data filed at 02/28/2023 1500 Gross per 24 hour  Intake 200 ml  Output --  Net 200 ml      Assessment/Plan:  62 y.o. female with left 5th toe and left heel wounds * No surgery found *   -Patient is doing fine this morning with no issues overnight -L foot is wrapped and dry -Current plan is for LLE angiogram on Monday for revascularization. The patient is still agreeable to this   Loel Dubonnet, PA-C [facility] [phone] 03/01/2023 8:01 AM

## 2023-03-01 NOTE — Progress Notes (Signed)
Pharmacy Antibiotic Note- follow-up  Martha Lozano is a 62 y.o. female admitted on 02/25/2023 with left foot cellulitis.  Pharmacy has been consulted for Vancomycin dosing.  Patient received Vancomycin 1gm IV x 1 dose in the ED.  Plan: Continue Vancomycin 500 mg IV Q 24 hrs. Goal AUC 400-550.  Expected AUC: 430.9  SCr used: 0.8 (rounded up from 0.74) Follow renal function Vanco peak / trough w/ 8/17 dose (peak ordered for 0500)     Temp (24hrs), Avg:98.1 F (36.7 C), Min:97.8 F (36.6 C), Max:98.4 F (36.9 C)  Recent Labs  Lab 02/25/23 1559 02/25/23 1604 02/25/23 2110 02/25/23 2118 02/25/23 2341 02/26/23 1110 02/27/23 0856 03/01/23 0941  WBC 11.4*  --  11.2*  --   --  13.6* 10.2 6.2  CREATININE 0.68  --  0.74  --   --  0.72 0.83 0.92  LATICACIDVEN  --  0.5  --  0.7 0.7  --   --   --     Estimated Creatinine Clearance: 39.1 mL/min (by C-G formula based on SCr of 0.92 mg/dL).    No Known Allergies  Antimicrobials this admission: 8/13 Vancomycin >>      Dose adjustments this admission:    Microbiology results: 8/13 Bcx- ngtd    Thank you for allowing pharmacy to be a part of this patient's care.  Greta Doom BS, PharmD, BCPS Clinical Pharmacist 03/01/2023 2:18 PM  Contact: [phone] after 3 PM  "Be curious, not judgmental..." -Debbora Dus

## 2023-03-02 DIAGNOSIS — E089 Diabetes mellitus due to underlying condition without complications: Secondary | ICD-10-CM

## 2023-03-02 LAB — GLUCOSE, CAPILLARY
Glucose-Capillary: 174 mg/dL — ABNORMAL HIGH (ref 70–99)
Glucose-Capillary: 243 mg/dL — ABNORMAL HIGH (ref 70–99)
Glucose-Capillary: 320 mg/dL — ABNORMAL HIGH (ref 70–99)
Glucose-Capillary: 346 mg/dL — ABNORMAL HIGH (ref 70–99)

## 2023-03-02 LAB — VANCOMYCIN, PEAK: Vancomycin Pk: 10 ug/mL — ABNORMAL LOW (ref 30–40)

## 2023-03-02 NOTE — Progress Notes (Signed)
Progress Note    03/02/2023 10:42 AM   Subjective: no complaints today  Vitals:   03/01/23 2116 03/02/23 0836  BP: (!) 160/86 134/75  Pulse: 95 81  Resp: 18   Temp: 99.3 F (37.4 C) 98.3 F (36.8 C)  SpO2: 98% 97%    Physical Exam: Awake alert and oriented Nonlabored respirations Palpable left common femoral pulse no pulses below the palpable on the left Left foot dressing clean dry intact  CBC    Component Value Date/Time   WBC 6.2 03/01/2023 0941   RBC 4.05 03/01/2023 0941   HGB 11.7 (L) 03/01/2023 0941   HCT 36.6 03/01/2023 0941   PLT 201 03/01/2023 0941   MCV 90.4 03/01/2023 0941   MCH 28.9 03/01/2023 0941   MCHC 32.0 03/01/2023 0941   RDW 13.1 03/01/2023 0941   LYMPHSABS 3.3 02/26/2023 1110   MONOABS 1.3 (H) 02/26/2023 1110   EOSABS 0.3 02/26/2023 1110   BASOSABS 0.1 02/26/2023 1110    BMET    Component Value Date/Time   NA 141 03/01/2023 0941   K 3.5 03/01/2023 0941   CL 100 03/01/2023 0941   CO2 28 03/01/2023 0941   GLUCOSE 332 (H) 03/01/2023 0941   BUN 8 03/01/2023 0941   CREATININE 0.92 03/01/2023 0941   CREATININE 0.71 03/09/2014 0916   CALCIUM 8.6 (L) 03/01/2023 0941   GFRNONAA >60 03/01/2023 0941   GFRNONAA >89 03/09/2014 0916   GFRAA >89 03/09/2014 0916    INR No results found for: "INR"   Intake/Output Summary (Last 24 hours) at 03/02/2023 1042 Last data filed at 03/02/2023 0902 Gross per 24 hour  Intake 300 ml  Output --  Net 300 ml   ABI Findings:  +---------+------------------+-----+----------+--------+  Right   Rt Pressure (mmHg)IndexWaveform  Comment   +---------+------------------+-----+----------+--------+  Brachial 104                    triphasic           +---------+------------------+-----+----------+--------+  PTA     37                0.36 monophasic          +---------+------------------+-----+----------+--------+  DP      39                0.38 monophasic           +---------+------------------+-----+----------+--------+  Great Toe0                 0.00 Absent              +---------+------------------+-----+----------+--------+   +---------+------------------+-----+----------+-------+  Left    Lt Pressure (mmHg)IndexWaveform  Comment  +---------+------------------+-----+----------+-------+  Brachial 93                     triphasic          +---------+------------------+-----+----------+-------+  PTA     50                0.48 monophasic         +---------+------------------+-----+----------+-------+  DP      48                0.46 monophasic         +---------+------------------+-----+----------+-------+  Great Toe0                 0.00 Absent             +---------+------------------+-----+----------+-------+   +-------+-----------+-----------+------------+------------+  ABI/TBIToday's ABIToday's TBIPrevious ABIPrevious TBI  +-------+-----------+-----------+------------+------------+  Right 0.38       absent                               +-------+-----------+-----------+------------+------------+  Left  0.48       absent                               +-------+-----------+-----------+------------+------------+         Summary:  Right: Resting right ankle-brachial index indicates severe right lower  extremity arterial disease. The right toe-brachial index is absent.   Left: Resting left ankle-brachial index indicates severe left lower  extremity arterial disease. The left toe-brachial index is absent.    Assessment:  62 y.o. female is here with left fifth toe and heel wounds with severely depressed ABIs with toe pressures of 0 bilaterally.  Plan: Angio Monday right common femoral approach   Natalio Salois C. Randie Heinz, MD [facility] [place] Office: [phone] Pager: [phone]  03/02/2023 10:42 AM

## 2023-03-02 NOTE — Plan of Care (Signed)

## 2023-03-02 NOTE — Progress Notes (Signed)
Progress Note   Patient: Martha Lozano ZOX:[id number] DOB: Jun 18, 1961 DOA: 02/25/2023     4 DOS: the patient was seen and examined on 03/02/2023   Brief hospital course: 62 year old female with breast cancer, type II DM, neuropathy, hep C, substance abuse presents to the hospital with complaints of left foot pain. Found to have bilateral PAD with nonhealing left foot ulcer., left leg cellulitis with small abscess without any evidence of osteomyelitis or septic arthritis. Vascular surgery consulted for PAD.  Vascular surgery scheduled for left lower extremity angiogram on 03/04/23 for revascularization   Assessment and Plan:    Cellulitis of left lower extremity, left foot nonhealing ulcer (base of the left great toe and left heel )in the setting of uncontrolled diabetes mellitus, severe PAD -MRI negative for any osteomyelitis - Small 16 mm subcutaneous fluid collection seen on CT scan. - Bilateral ABI 0.38 on right and 0.48 on the left.  TBI are absent bilaterally.  Absent waveform on bilateral great toe.  Monophasic waveform in DP and PTA. -Continue IV vancomycin  -Blood cultures negative till date -Vascular surgery consulted and patient is scheduled for left lower extremity angiogram on 03/04/23 for revascularization. -Continue aspirin 81 mg daily   Active problems Hypokalemia: replaced, resolved   Diabetes mellitus type 2, with hyperglycemia, NIDDM diabetic foot ulcer, severe PAD: Blood sugars better controlled this AM after increasing Semglee to 10 units at bedtime -Hemoglobin A1c 13.1 - On glipizide and metformin at home, on hold. -Continue Semglee to 10 units at bedtime.  Continue sliding scale coverage -Pt will need insulin on discharge  Chronic pain syndrome. - Patient used to see pain clinic for Suboxone therapy, has not seen them since May 2024. Outpatient follow-up recommended.   Hypothyroidism. -Continue Synthroid. -TSH 5.8 in 2015, will recheck in am   Essential  hypertension -Normotensive -Resume losartan but at a lower dose.   Mood disorder. Continue Effexor.     Substance abuse  UDS positive for opiates and cocaine. Patient did have opioids prior to UDS in the hospital.  Avoid beta-blockers.   Severe protein calorie malnutrition, underweight  Etiology: social / environmental circumstances (polysubstance abuse) Signs/Symptoms: severe fat depletion, severe muscle depletion Interventions: MVI, Glucerna shake, Refer to RD note for recommendations   Estimated body mass index is 16.6 kg/m as calculated from the following:   Height as of an earlier encounter on 02/25/23: 5' (1.524 m).   Weight as of an earlier encounter on 02/25/23: 38.6 kg.   Nicotine Dependence Smoking cessation has been discussed with patient in detail. She declined a nicotine transdermal patch.       Subjective: Patient denies fever, chills Reported mild to moderate pain in left foot Denies any nausea, vomiting, abdominal pain, chest pain, shortness of breath, headache, dizziness.  Physical Exam: Vitals:   03/01/23 0819 03/01/23 1536 03/01/23 2116 03/02/23 0836  BP: 129/66 134/75 (!) 160/86 134/75  Pulse: 88 96 95 81  Resp:  20 18   Temp: 98.1 F (36.7 C) 98.2 F (36.8 C) 99.3 F (37.4 C) 98.3 F (36.8 C)  TempSrc:  Oral Oral Oral  SpO2: 98% 98% 98% 97%   Physical Exam Constitutional:      General: She is not in acute distress.    Appearance: Normal appearance. She is not ill-appearing.     Comments: Thin built female  HENT:     Head: Normocephalic and atraumatic.     Nose: Nose normal. No congestion.     Mouth/Throat:  Mouth: Mucous membranes are moist.     Pharynx: Oropharynx is clear. No oropharyngeal exudate.  Eyes:     Extraocular Movements: Extraocular movements intact.     Conjunctiva/sclera: Conjunctivae normal.     Pupils: Pupils are equal, round, and reactive to light.  Cardiovascular:     Rate and Rhythm: Normal rate and regular  rhythm.     Pulses: Normal pulses.     Heart sounds: Normal heart sounds. No murmur heard. Pulmonary:     Effort: Pulmonary effort is normal. No respiratory distress.     Breath sounds: Normal breath sounds.  Abdominal:     General: Abdomen is flat. Bowel sounds are normal.     Palpations: Abdomen is soft.  Musculoskeletal:        General: Tenderness present. Normal range of motion.     Cervical back: Normal range of motion and neck supple.  Skin:    General: Skin is warm and dry.     Capillary Refill: Capillary refill takes 2 to 3 seconds.  Neurological:     Mental Status: She is alert and oriented to person, place, and time.     Cranial Nerves: No cranial nerve deficit.     Sensory: No sensory deficit.     Motor: No weakness.  Psychiatric:        Mood and Affect: Mood normal.        Behavior: Behavior normal.    Left foot findings - dressed appropriately at this enceounter        Data Reviewed:  Latest Reference Range & Units 03/01/23 09:41  Sodium 135 - 145 mmol/L 141  Potassium 3.5 - 5.1 mmol/L 3.5  Chloride 98 - 111 mmol/L 100  CO2 22 - 32 mmol/L 28  Glucose 70 - 99 mg/dL 161 (H)  BUN 8 - 23 mg/dL 8  Creatinine 0.96 - 0.45 mg/dL 4.09  Calcium 8.9 - 81.1 mg/dL 8.6 (L)  Anion gap 5 - 15  13  GFR, Estimated >60 mL/min >60  WBC 4.0 - 10.5 K/uL 6.2  RBC 3.87 - 5.11 MIL/uL 4.05  Hemoglobin 12.0 - 15.0 g/dL 91.4 (L)  HCT 78.2 - 95.6 % 36.6  MCV 80.0 - 100.0 fL 90.4  MCH 26.0 - 34.0 pg 28.9  MCHC 30.0 - 36.0 g/dL 21.3  RDW 08.6 - 57.8 % 13.1  Platelets 150 - 400 K/uL 201  nRBC 0.0 - 0.2 % 0.0  TSH 0.350 - 4.500 uIU/mL 1.049  (H): Data is abnormally high (L): Data is abnormally low  Family Communication:   Disposition: Status is: Inpatient   Planned Discharge Destination: Home    Time spent: 25 minutes  Author: Ernestene Mention, MD 03/02/2023 10:34 AM  For on call review [email or web address].

## 2023-03-03 LAB — GLUCOSE, CAPILLARY
Glucose-Capillary: 205 mg/dL — ABNORMAL HIGH (ref 70–99)
Glucose-Capillary: 371 mg/dL — ABNORMAL HIGH (ref 70–99)
Glucose-Capillary: 180 mg/dL — ABNORMAL HIGH (ref 70–99)
Glucose-Capillary: 196 mg/dL — ABNORMAL HIGH (ref 70–99)

## 2023-03-03 LAB — BASIC METABOLIC PANEL
Anion gap: 10 (ref 5–15)
BUN: 9 mg/dL (ref 8–23)
CO2: 28 mmol/L (ref 22–32)
Calcium: 8.5 mg/dL — ABNORMAL LOW (ref 8.9–10.3)
Chloride: 99 mmol/L (ref 98–111)
Creatinine, Ser: 0.79 mg/dL (ref 0.44–1.00)
GFR, Estimated: >60 mL/min (ref >60)
Glucose, Bld: 352 mg/dL — ABNORMAL HIGH (ref 70–99)
Potassium: 3.8 mmol/L (ref 3.5–5.1)
Sodium: 137 mmol/L (ref 135–145)

## 2023-03-03 LAB — CBC
HCT: 34.7 % — ABNORMAL LOW (ref 36.0–46.0)
Hemoglobin: 11.2 g/dL — ABNORMAL LOW (ref 12.0–15.0)
MCH: 29.7 pg (ref 26.0–34.0)
MCHC: 32.3 g/dL (ref 30.0–36.0)
MCV: 92 fL (ref 80.0–100.0)
Platelets: 210 10*3/uL (ref 150–400)
RBC: 3.77 MIL/uL — ABNORMAL LOW (ref 3.87–5.11)
RDW: 13.2 % (ref 11.5–15.5)
WBC: 6.6 10*3/uL (ref 4.0–10.5)
nRBC: 0 % (ref 0.0–0.2)

## 2023-03-03 LAB — VANCOMYCIN, TROUGH: Vancomycin Tr: 4 ug/mL — ABNORMAL LOW (ref 15–20)

## 2023-03-03 MED ORDER — LINEZOLID 600 MG/300ML IV SOLN
600.0000 mg | Freq: Two times a day (BID) | INTRAVENOUS | Status: DC
  Administered 2023-03-03 – 2023-03-06 (×5): 600 mg via INTRAVENOUS
  Filled 2023-03-03 (×6): qty 300

## 2023-03-03 MED ORDER — INSULIN GLARGINE-YFGN 100 UNIT/ML ~~LOC~~ SOLN
15.0000 [IU] | Freq: Every day | SUBCUTANEOUS | Status: DC
  Administered 2023-03-03: 15 [IU] via SUBCUTANEOUS
  Filled 2023-03-03 (×2): qty 0.15

## 2023-03-03 MED ORDER — VANCOMYCIN HCL IN DEXTROSE 1-5 GM/200ML-% IV SOLN
1000.0000 mg | INTRAVENOUS | Status: DC
  Administered 2023-03-03: 1000 mg via INTRAVENOUS
  Filled 2023-03-03: qty 200

## 2023-03-03 NOTE — Plan of Care (Signed)

## 2023-03-03 NOTE — Progress Notes (Signed)
Pharmacy Antibiotic Note  Martha Lozano is a 62 y.o. female admitted on 02/25/2023 with left foot cellulitis.  Pharmacy has been consulted for Vancomycin dosing.    Vancomycin peak is 10, trough is 4 - calculated AUC is low at 180. Scr stable at 0.92 yesterday.   Plan: Increase vancomycin to 1g IV Q24 hrs Follow renal function Monitor vancomycin levels as needed     Temp (24hrs), Avg:98.3 F (36.8 C), Min:98 F (36.7 C), Max:98.7 F (37.1 C)  Recent Labs  Lab 02/25/23 1559 02/25/23 1604 02/25/23 2110 02/25/23 2118 02/25/23 2341 02/26/23 1110 02/27/23 0856 03/01/23 0941 03/02/23 0813 03/03/23 0142  WBC 11.4*  --  11.2*  --   --  13.6* 10.2 6.2  --   --   CREATININE 0.68  --  0.74  --   --  0.72 0.83 0.92  --   --   LATICACIDVEN  --  0.5  --  0.7 0.7  --   --   --   --   --   VANCOTROUGH  --   --   --   --   --   --   --   --   --  4*  VANCOPEAK  --   --   --   --   --   --   --   --  10*  --     Estimated Creatinine Clearance: 39.1 mL/min (by C-G formula based on SCr of 0.92 mg/dL).    No Known Allergies  Antimicrobials this admission: 8/13 Vancomycin >>     Microbiology results:  Bcx 8/16: ngtd   Thank you for allowing pharmacy to participate in this patient's care,  Sherron Monday, PharmD, BCCCP Clinical Pharmacist  Phone: [phone] 03/03/2023 2:49 AM  Please check AMION for all [facility] Pharmacy phone numbers After 10:00 PM, call Main Pharmacy [phone]

## 2023-03-03 NOTE — Progress Notes (Signed)
  Progress Note    03/03/2023 2:12 PM * No surgery date entered *  Subjective: Feeling okay today  Vitals:   03/03/23 0545 03/03/23 0829  BP: 128/64 125/70  Pulse: 75 69  Resp: 18 17  Temp: 98.4 F (36.9 C) 98.5 F (36.9 C)  SpO2: 98% 100%    Physical Exam: Awake alert and oriented Bilateral common femoral pulses palpable Dressing on left foot clean dry intact  CBC    Component Value Date/Time   WBC 6.6 03/03/2023 1133   RBC 3.77 (L) 03/03/2023 1133   HGB 11.2 (L) 03/03/2023 1133   HCT 34.7 (L) 03/03/2023 1133   PLT 210 03/03/2023 1133   MCV 92.0 03/03/2023 1133   MCH 29.7 03/03/2023 1133   MCHC 32.3 03/03/2023 1133   RDW 13.2 03/03/2023 1133   LYMPHSABS 3.3 02/26/2023 1110   MONOABS 1.3 (H) 02/26/2023 1110   EOSABS 0.3 02/26/2023 1110   BASOSABS 0.1 02/26/2023 1110    BMET    Component Value Date/Time   NA 137 03/03/2023 1133   K 3.8 03/03/2023 1133   CL 99 03/03/2023 1133   CO2 28 03/03/2023 1133   GLUCOSE 352 (H) 03/03/2023 1133   BUN 9 03/03/2023 1133   CREATININE 0.79 03/03/2023 1133   CREATININE 0.71 03/09/2014 0916   CALCIUM 8.5 (L) 03/03/2023 1133   GFRNONAA >60 03/03/2023 1133   GFRNONAA >89 03/09/2014 0916   GFRAA >89 03/09/2014 0916    INR No results found for: "INR"   Intake/Output Summary (Last 24 hours) at 03/03/2023 1412 Last data filed at 03/03/2023 1255 Gross per 24 hour  Intake 1013.17 ml  Output --  Net 1013.17 ml     Assessment/plan:  62 y.o. female is here with left foot wound with severely depressed bilateral ABIs and toe pressures of 0.  Plan for angiogram tomorrow in the Cath Lab from right common femoral approach.    Loucinda Croy C. Randie Heinz, MD [facility] Office: [phone] Pager: [phone]  03/03/2023 2:12 PM

## 2023-03-03 NOTE — Progress Notes (Addendum)
Progress Note   Patient: Martha Lozano WGN:[id number] DOB: Jul 08, 1961 DOA: 02/25/2023     5 DOS: the patient was seen and examined on 03/03/2023   Brief hospital course: 62 year old female with breast cancer, type II DM, neuropathy, hep C, substance abuse presents to the hospital with complaints of left foot pain. Found to have bilateral PAD with nonhealing left foot ulcer., left leg cellulitis with small abscess without any evidence of osteomyelitis or septic arthritis. Vascular surgery consulted for PAD.  Vascular surgery scheduled for left lower extremity angiogram on 03/04/23 for revascularization   Assessment and Plan:    Cellulitis of left lower extremity, left foot nonhealing ulcer (base of the left great toe and left heel) in the setting of uncontrolled diabetes mellitus, severe PAD -MRI negative for any osteomyelitis - Small 16 mm subcutaneous fluid collection seen on CT scan. - Bilateral ABI 0.38 on right and 0.48 on the left.  TBI are absent bilaterally.  Absent waveform on bilateral great toe.  Monophasic waveform in DP and PTA. -Change IV vancomycin to IV Zyvox Q12hrs (vancomycin level was subtherapeutic-Based on her vancomycin levels and her low body weight, the dose recommended for her to be therapeutic would be ~40 mg/kg which is double of a typical loading dose. Given the requirement for such high doses, switched to zyvox) -Blood cultures negative till date -Vascular surgery consulted and patient is scheduled for left lower extremity angiogram on 03/04/23 for revascularization. -Continue aspirin 81 mg daily   Diabetes mellitus type 2, with hyperglycemia: Blood sugars are uncontrolled this AM  NIDDM diabetic foot ulcer, severe PAD:  Increase Semglee to 15 units at bedtime -Continue SSI prn, f/u FSBS -Hemoglobin A1c 13.1 - On glipizide and metformin at home, on hold. -Pt will need insulin on discharge  Chronic pain syndrome. - Patient used to see pain clinic for  Suboxone therapy, has not seen them since May 2024. Outpatient follow-up recommended.  Hypokalemia: replaced, resolved   Hypothyroidism. -Continue Synthroid. -TSH 5.8 in 2015, will recheck in am   Essential hypertension -Normotensive -Resume losartan but at a lower dose.   Mood disorder. Continue Effexor.   Substance abuse  UDS positive for opiates and cocaine. Patient did have opioids prior to UDS in the hospital.  Avoid beta-blockers.   Severe protein calorie malnutrition, underweight  Etiology: social / environmental circumstances (polysubstance abuse) Signs/Symptoms: severe fat depletion, severe muscle depletion Interventions: MVI, Glucerna shake, Refer to RD note for recommendations Estimated body mass index is 16.6 kg/m as calculated from the following:   Height as of an earlier encounter on 02/25/23: 5' (1.524 m).   Weight as of an earlier encounter on 02/25/23: 38.6 kg.   Nicotine Dependence Smoking cessation has been discussed with patient in detail. She declined a nicotine transdermal patch.       Subjective:   Reported mild to moderate pain in left foot Denies fever, chills, nausea, vomiting, abdominal pain, chest pain, shortness of breath, headache, dizziness.  Physical Exam: Vitals:   03/02/23 1703 03/02/23 2124 03/03/23 0545 03/03/23 0829  BP: 131/75 123/68 128/64 125/70  Pulse: (!) 101 89 75 69  Resp:  18 18 17   Temp: 98.7 F (37.1 C) 98 F (36.7 C) 98.4 F (36.9 C) 98.5 F (36.9 C)  TempSrc: Oral Oral Oral Oral  SpO2: 100% 99% 98% 100%   Physical Exam Constitutional:      General: She is not in acute distress.    Appearance: Normal appearance. She is not ill-appearing.  Comments: Thin built female  HENT:     Head: Normocephalic and atraumatic.     Nose: Nose normal. No congestion.     Mouth/Throat:     Mouth: Mucous membranes are moist.     Pharynx: Oropharynx is clear. No oropharyngeal exudate.  Eyes:     Extraocular Movements:  Extraocular movements intact.     Conjunctiva/sclera: Conjunctivae normal.     Pupils: Pupils are equal, round, and reactive to light.  Cardiovascular:     Rate and Rhythm: Normal rate and regular rhythm.     Pulses: Normal pulses.     Heart sounds: Normal heart sounds. No murmur heard. Pulmonary:     Effort: Pulmonary effort is normal. No respiratory distress.     Breath sounds: Normal breath sounds.  Abdominal:     General: Abdomen is flat. Bowel sounds are normal.     Palpations: Abdomen is soft.  Musculoskeletal:        General: Tenderness present. Normal range of motion.     Cervical back: Normal range of motion and neck supple.  Skin:    General: Skin is warm and dry.     Capillary Refill: Capillary refill takes 2 to 3 seconds.  Neurological:     Mental Status: She is alert and oriented to person, place, and time.     Cranial Nerves: No cranial nerve deficit.     Sensory: No sensory deficit.     Motor: No weakness.  Psychiatric:        Mood and Affect: Mood normal.        Behavior: Behavior normal.    Left foot findings - dressed appropriately at this enceounter        Data Reviewed:  Latest Reference Range & Units 03/01/23 09:41  Sodium 135 - 145 mmol/L 141  Potassium 3.5 - 5.1 mmol/L 3.5  Chloride 98 - 111 mmol/L 100  CO2 22 - 32 mmol/L 28  Glucose 70 - 99 mg/dL 782 (H)  BUN 8 - 23 mg/dL 8  Creatinine 9.56 - 2.13 mg/dL 0.86  Calcium 8.9 - 57.8 mg/dL 8.6 (L)  Anion gap 5 - 15  13  GFR, Estimated >60 mL/min >60  WBC 4.0 - 10.5 K/uL 6.2  RBC 3.87 - 5.11 MIL/uL 4.05  Hemoglobin 12.0 - 15.0 g/dL 46.9 (L)  HCT 62.9 - 52.8 % 36.6  MCV 80.0 - 100.0 fL 90.4  MCH 26.0 - 34.0 pg 28.9  MCHC 30.0 - 36.0 g/dL 41.3  RDW 24.4 - 01.0 % 13.1  Platelets 150 - 400 K/uL 201  nRBC 0.0 - 0.2 % 0.0  TSH 0.350 - 4.500 uIU/mL 1.049  (H): Data is abnormally high (L): Data is abnormally low  Latest Reference Range & Units 03/02/23 22:21 03/03/23 08:27 03/03/23 11:35   Glucose-Capillary 70 - 99 mg/dL 272 (H) 536 (H) 644 (H)  (H): Data is abnormally high  Family Communication:   Disposition: Status is: Inpatient   Planned Discharge Destination: Home    Time spent: 35 minutes  Author: Ernestene Mention, MD 03/03/2023 11:54 AM  For on call review [email or web address].

## 2023-03-03 NOTE — Plan of Care (Signed)
Patient alert and oriented. No distress noted. Dressing to foot changed as per orders. Will continue to monitor.  Problem: Nutrition: Goal: Adequate nutrition will be maintained Outcome: Progressing   Problem: Coping: Goal: Level of anxiety will decrease Outcome: Progressing   Problem: Pain Managment: Goal: General experience of comfort will improve Outcome: Progressing   Problem: Safety: Goal: Ability to remain free from injury will improve Outcome: Progressing   Problem: Skin Integrity: Goal: Risk for impaired skin integrity will decrease Outcome: Progressing

## 2023-03-04 ENCOUNTER — Encounter (HOSPITAL_COMMUNITY)
Admission: EM | Disposition: A | Discharge status: Home / Self Care | Payer: Self-pay | Source: Home / Self Care | Attending: Internal Medicine

## 2023-03-04 ENCOUNTER — Inpatient Hospital Stay (HOSPITAL_COMMUNITY): Payer: Medicaid Other

## 2023-03-04 DIAGNOSIS — I70245 Atherosclerosis of native arteries of left leg with ulceration of other part of foot: Secondary | ICD-10-CM

## 2023-03-04 DIAGNOSIS — L0390 Cellulitis, unspecified: Secondary | ICD-10-CM | POA: Diagnosis not present

## 2023-03-04 DIAGNOSIS — I7090 Unspecified atherosclerosis: Secondary | ICD-10-CM

## 2023-03-04 DIAGNOSIS — L03116 Cellulitis of left lower limb: Secondary | ICD-10-CM | POA: Diagnosis not present

## 2023-03-04 DIAGNOSIS — E43 Unspecified severe protein-calorie malnutrition: Secondary | ICD-10-CM

## 2023-03-04 HISTORY — PX: ABDOMINAL AORTOGRAM W/LOWER EXTREMITY: CATH118223

## 2023-03-04 LAB — CBC
HCT: 35 % — ABNORMAL LOW (ref 36.0–46.0)
Hemoglobin: 11.3 g/dL — ABNORMAL LOW (ref 12.0–15.0)
MCH: 29.4 pg (ref 26.0–34.0)
MCHC: 32.3 g/dL (ref 30.0–36.0)
MCV: 91.1 fL (ref 80.0–100.0)
Platelets: 205 10*3/uL (ref 150–400)
RBC: 3.84 MIL/uL — ABNORMAL LOW (ref 3.87–5.11)
RDW: 13 % (ref 11.5–15.5)
WBC: 7.5 10*3/uL (ref 4.0–10.5)
nRBC: 0 % (ref 0.0–0.2)

## 2023-03-04 LAB — BASIC METABOLIC PANEL
Anion gap: 7 (ref 5–15)
BUN: 13 mg/dL (ref 8–23)
CO2: 28 mmol/L (ref 22–32)
Calcium: 8.2 mg/dL — ABNORMAL LOW (ref 8.9–10.3)
Chloride: 99 mmol/L (ref 98–111)
Creatinine, Ser: 0.84 mg/dL (ref 0.44–1.00)
GFR, Estimated: >60 mL/min (ref >60)
Glucose, Bld: 483 mg/dL — ABNORMAL HIGH (ref 70–99)
Potassium: 3.7 mmol/L (ref 3.5–5.1)
Sodium: 134 mmol/L — ABNORMAL LOW (ref 135–145)

## 2023-03-04 LAB — GLUCOSE, CAPILLARY
Glucose-Capillary: 197 mg/dL — ABNORMAL HIGH (ref 70–99)
Glucose-Capillary: 163 mg/dL — ABNORMAL HIGH (ref 70–99)
Glucose-Capillary: 238 mg/dL — ABNORMAL HIGH (ref 70–99)
Glucose-Capillary: 381 mg/dL — ABNORMAL HIGH (ref 70–99)
Glucose-Capillary: 322 mg/dL — ABNORMAL HIGH (ref 70–99)

## 2023-03-04 SURGERY — ABDOMINAL AORTOGRAM W/LOWER EXTREMITY
Anesthesia: LOCAL

## 2023-03-04 MED ORDER — LABETALOL HCL 5 MG/ML IV SOLN: 10.0000 mg | INTRAVENOUS | Status: DC | PRN

## 2023-03-04 MED ORDER — SODIUM CHLORIDE 0.9% FLUSH
3.0000 mL | Freq: Two times a day (BID) | INTRAVENOUS | Status: DC
  Administered 2023-03-04: 3 mL via INTRAVENOUS

## 2023-03-04 MED ORDER — ORAL CARE MOUTH RINSE: 15.0000 mL | OROMUCOSAL | Status: DC | PRN

## 2023-03-04 MED ORDER — INSULIN GLARGINE-YFGN 100 UNIT/ML ~~LOC~~ SOLN
18.0000 [IU] | Freq: Every day | SUBCUTANEOUS | Status: DC
  Administered 2023-03-04: 18 [IU] via SUBCUTANEOUS
  Filled 2023-03-04 (×2): qty 0.18

## 2023-03-04 MED ORDER — HEPARIN SODIUM (PORCINE) 5000 UNIT/ML IJ SOLN: 5000.0000 [IU] | Freq: Three times a day (TID) | INTRAMUSCULAR | Status: DC

## 2023-03-04 MED ORDER — MIDAZOLAM HCL 2 MG/2ML IJ SOLN
INTRAMUSCULAR | Status: AC
  Filled 2023-03-04: qty 2

## 2023-03-04 MED ORDER — ONDANSETRON HCL 4 MG/2ML IJ SOLN: 4.0000 mg | Freq: Four times a day (QID) | INTRAMUSCULAR | Status: DC | PRN

## 2023-03-04 MED ORDER — FENTANYL CITRATE (PF) 100 MCG/2ML IJ SOLN
INTRAMUSCULAR | Status: AC
  Filled 2023-03-04: qty 2

## 2023-03-04 MED ORDER — SODIUM CHLORIDE 0.9% FLUSH: 3.0000 mL | INTRAVENOUS | Status: DC | PRN

## 2023-03-04 MED ORDER — SODIUM CHLORIDE 0.9 % IV SOLN: INTRAVENOUS | Status: DC

## 2023-03-04 MED ORDER — HEPARIN (PORCINE) IN NACL 1000-0.9 UT/500ML-% IV SOLN
INTRAVENOUS | Status: DC | PRN
  Administered 2023-03-04 (×2): 500 mL

## 2023-03-04 MED ORDER — MIDAZOLAM HCL 2 MG/2ML IJ SOLN
INTRAMUSCULAR | Status: DC | PRN
  Administered 2023-03-04: 1 mg via INTRAVENOUS

## 2023-03-04 MED ORDER — ACETAMINOPHEN 325 MG PO TABS: 650.0000 mg | ORAL_TABLET | ORAL | Status: DC | PRN

## 2023-03-04 MED ORDER — LIDOCAINE HCL (PF) 1 % IJ SOLN
INTRAMUSCULAR | Status: AC
  Filled 2023-03-04: qty 30

## 2023-03-04 MED ORDER — ATROPINE SULFATE 1 MG/10ML IJ SOSY
PREFILLED_SYRINGE | INTRAMUSCULAR | Status: AC
  Filled 2023-03-04: qty 10

## 2023-03-04 MED ORDER — HYDRALAZINE HCL 20 MG/ML IJ SOLN: 5.0000 mg | INTRAMUSCULAR | Status: DC | PRN

## 2023-03-04 MED ORDER — IODIXANOL 320 MG/ML IV SOLN
INTRAVENOUS | Status: DC | PRN
  Administered 2023-03-04: 40 mL via INTRA_ARTERIAL

## 2023-03-04 MED ORDER — SODIUM CHLORIDE 0.9 % WEIGHT BASED INFUSION
1.0000 mL/kg/h | INTRAVENOUS | Status: AC
  Administered 2023-03-04: 1 mL/kg/h via INTRAVENOUS

## 2023-03-04 MED ORDER — FENTANYL CITRATE (PF) 100 MCG/2ML IJ SOLN
INTRAMUSCULAR | Status: DC | PRN
  Administered 2023-03-04: 25 ug via INTRAVENOUS

## 2023-03-04 MED ORDER — OXYCODONE HCL 5 MG PO TABS: 5.0000 mg | ORAL_TABLET | ORAL | Status: DC | PRN

## 2023-03-04 MED ORDER — SODIUM CHLORIDE 0.9 % IV SOLN: 250.0000 mL | INTRAVENOUS | Status: DC | PRN

## 2023-03-04 MED ORDER — LIDOCAINE HCL (PF) 1 % IJ SOLN
INTRAMUSCULAR | Status: DC | PRN
  Administered 2023-03-04: 10 mL

## 2023-03-04 SURGICAL SUPPLY — 11 items
CATH OMNI FLUSH 5F 65CM (CATHETERS) IMPLANT
COVER DOME SNAP 22 D (MISCELLANEOUS) IMPLANT
KIT MICROPUNCTURE NIT STIFF (SHEATH) IMPLANT
KIT SINGLE USE MANIFOLD (KITS) IMPLANT
PROTECTION STATION PRESSURIZED (MISCELLANEOUS) ×1
SET ATX-X65L (MISCELLANEOUS) IMPLANT
SHEATH PINNACLE 5F 10CM (SHEATH) IMPLANT
SHEATH PROBE COVER 6X72 (BAG) IMPLANT
STATION PROTECTION PRESSURIZED (MISCELLANEOUS) IMPLANT
TRAY PV CATH (CUSTOM PROCEDURE TRAY) ×1 IMPLANT
WIRE BENTSON .035X145CM (WIRE) IMPLANT

## 2023-03-04 NOTE — Progress Notes (Signed)
5Fr RFA sheathed pulled by [facility] nurse under this author's supervision. Bedrest started at 1250. Hemostasis achieved. Level 0. RFA site is clean, dry, and intact. No bleeding or hematoma present.

## 2023-03-04 NOTE — Progress Notes (Signed)
Progress Note   Patient: Martha Lozano ZOX:[id number] DOB: 1960/08/09 DOA: 02/25/2023     6 DOS: the patient was seen and examined on 03/04/2023   Brief hospital course: 62 year old female with breast cancer, type II DM, neuropathy, hep C, substance abuse presents to the hospital with complaints of left foot pain. Found to have bilateral PAD with nonhealing left foot ulcer., left leg cellulitis with small abscess without any evidence of osteomyelitis or septic arthritis. Vascular surgery consulted for PAD.  Vascular surgery scheduled for left lower extremity angiogram on 03/04/23 for revascularization  8/19: S/p angiography today with vascular surgery, found to have extensive disease, they are planning bypass during current hospitalization.   Assessment and Plan:    Cellulitis of left lower extremity, left foot nonhealing ulcer (base of the left great toe and left heel) in the setting of uncontrolled diabetes mellitus, severe PAD -MRI negative for any osteomyelitis - Small 16 mm subcutaneous fluid collection seen on CT scan. -Lower extremity angiography with extensive PAD, patient will need bypass during current hospitalization. -Initially received vancomycin which was later switched with Zyvox-continue Zyvox -Blood cultures negative till date   Diabetes mellitus type 2, with hyperglycemia:NIDDM diabetic foot ulcer, severe PAD:  CBG remained elevated Increase Semglee to 18 units at bedtime -Continue SSI prn, f/u FSBS -Hemoglobin A1c 13.1 - On glipizide and metformin at home, on hold. -Pt will need insulin on discharge  Chronic pain syndrome. - Patient used to see pain clinic for Suboxone therapy, has not seen them since May 2024. Outpatient follow-up recommended.  Hypokalemia: replaced, resolved   Hypothyroidism. -Continue Synthroid. -TSH normal  Essential hypertension -Normotensive -Resume losartan but at a lower dose.   Mood disorder. Continue Effexor.   Substance  abuse  UDS positive for opiates and cocaine. Patient did have opioids prior to UDS in the hospital.  Avoid beta-blockers.   Severe protein calorie malnutrition, underweight  Etiology: social / environmental circumstances (polysubstance abuse) Signs/Symptoms: severe fat depletion, severe muscle depletion Interventions: MVI, Glucerna shake, Refer to RD note for recommendations Estimated body mass index is 16.6 kg/m as calculated from the following:   Height as of an earlier encounter on 02/25/23: 5' (1.524 m).   Weight as of an earlier encounter on 02/25/23: 38.6 kg. -Dietitian consult   Nicotine Dependence Smoking cessation has been discussed with patient in detail. She declined a nicotine transdermal patch.      Subjective: Patient just had her procedure.  No new concern .  Physical Exam: Vitals:   03/04/23 1255 03/04/23 1300 03/04/23 1305 03/04/23 1334  BP: (!) 129/91 126/75 (!) 131/90 124/74  Pulse: 68 78 80 95  Resp: 13 12 17 15   Temp:    98.7 F (37.1 C)  TempSrc:    Oral  SpO2: 100% 100% 98% 100%    General.  Severely malnourished lady, in no acute distress. Pulmonary.  Lungs clear bilaterally, normal respiratory effort. CV.  Regular rate and rhythm, no JVD, rub or murmur. Abdomen.  Soft, nontender, nondistended, BS positive. CNS.  Alert and oriented .  No focal neurologic deficit. Extremities.  No edema, no cyanosis, pulses intact and symmetrical. Psychiatry.  Judgment and insight appears normal.        Data Reviewed: Prior data reviewed  Family Communication:   Disposition: Status is: Inpatient   Planned Discharge Destination: Home  DVT prophylaxis.  Lovenox Time spent: 40 minutes  This record has been created using Conservation officer, historic buildings. Errors have been sought and corrected,but may  not always be located. Such creation errors do not reflect on the standard of care.   Author: Arnetha Courser, MD 03/04/2023 3:37 PM  For on call review  [email or web address].

## 2023-03-04 NOTE — Plan of Care (Signed)
  Problem: Education: Goal: Knowledge of General Education information will improve Description: Including pain rating scale, medication(s)/side effects and non-pharmacologic comfort measures Outcome: Progressing   Problem: Health Behavior/Discharge Planning: Goal: Ability to manage health-related needs will improve Outcome: Progressing   Problem: Clinical Measurements: Goal: Ability to maintain clinical measurements within normal limits will improve Outcome: Progressing Goal: Will remain free from infection Outcome: Progressing   Problem: Activity: Goal: Risk for activity intolerance will decrease Outcome: Progressing   Problem: Nutrition: Goal: Adequate nutrition will be maintained Outcome: Progressing   Problem: Coping: Goal: Level of anxiety will decrease Outcome: Progressing   Problem: Pain Managment: Goal: General experience of comfort will improve Outcome: Progressing   Problem: Safety: Goal: Ability to remain free from injury will improve Outcome: Progressing   Problem: Skin Integrity: Goal: Risk for impaired skin integrity will decrease Outcome: Progressing   Problem: Education: Goal: Understanding of CV disease, CV risk reduction, and recovery process will improve Outcome: Progressing   Problem: Activity: Goal: Ability to return to baseline activity level will improve Outcome: Progressing   Problem: Cardiovascular: Goal: Ability to achieve and maintain adequate cardiovascular perfusion will improve Outcome: Progressing Goal: Vascular access site(s) Level 0-1 will be maintained Outcome: Progressing   Problem: Health Behavior/Discharge Planning: Goal: Ability to safely manage health-related needs after discharge will improve Outcome: Progressing

## 2023-03-04 NOTE — H&P (View-Only) (Signed)
  Progress Note    03/04/2023 10:58 AM Day of Surgery  Subjective: Resting comfortably  Vitals:   03/04/23 0807 03/04/23 1044  BP: 126/74   Pulse: 69   Resp: 18   Temp: 98.3 F (36.8 C)   SpO2: 99% 96%    Physical Exam: Awake alert and oriented Bilateral common femoral pulses palpable Dressing on left foot  CBC    Component Value Date/Time   WBC 7.5 03/04/2023 0358   RBC 3.84 (L) 03/04/2023 0358   HGB 11.3 (L) 03/04/2023 0358   HCT 35.0 (L) 03/04/2023 0358   PLT 205 03/04/2023 0358   MCV 91.1 03/04/2023 0358   MCH 29.4 03/04/2023 0358   MCHC 32.3 03/04/2023 0358   RDW 13.0 03/04/2023 0358   LYMPHSABS 3.3 02/26/2023 1110   MONOABS 1.3 (H) 02/26/2023 1110   EOSABS 0.3 02/26/2023 1110   BASOSABS 0.1 02/26/2023 1110    BMET    Component Value Date/Time   NA 134 (L) 03/04/2023 0358   K 3.7 03/04/2023 0358   CL 99 03/04/2023 0358   CO2 28 03/04/2023 0358   GLUCOSE 483 (H) 03/04/2023 0358   BUN 13 03/04/2023 0358   CREATININE 0.84 03/04/2023 0358   CREATININE 0.71 03/09/2014 0916   CALCIUM 8.2 (L) 03/04/2023 0358   GFRNONAA >60 03/04/2023 0358   GFRNONAA >89 03/09/2014 0916   GFRAA >89 03/09/2014 0916    INR No results found for: "INR"   Intake/Output Summary (Last 24 hours) at 03/04/2023 1058 Last data filed at 03/04/2023 0310 Gross per 24 hour  Intake 772 ml  Output --  Net 772 ml     Assessment/plan:  62 y.o. female is here with left foot wound with severely depressed bilateral ABIs and toe pressures of 0.  Plan for angiogram today in the Cath Lab from right common femoral approach.    Victorino Sparrow MD [facility] [place] Office: [phone] Pager: [phone]  03/04/2023 10:58 AM

## 2023-03-04 NOTE — Interval H&P Note (Signed)
History and Physical Interval Note:  03/04/2023 11:39 AM  Martha Lozano  has presented today for surgery, with the diagnosis of left leg ischemia with tissue lose at toe and heal.  The various methods of treatment have been discussed with the patient and family. After consideration of risks, benefits and other options for treatment, the patient has consented to  Procedure(s): ABDOMINAL AORTOGRAM W/LOWER EXTREMITY (N/A) as a surgical intervention.  The patient's history has been reviewed, patient examined, no change in status, stable for surgery.  I have reviewed the patient's chart and labs.  Questions were answered to the patient's satisfaction.     Lemar Livings

## 2023-03-04 NOTE — Inpatient Diabetes Management (Addendum)
Inpatient Diabetes Program Recommendations  AACE/ADA: New Consensus Statement on Inpatient Glycemic Control (2015)  Target Ranges:  Prepandial:   less than 140 mg/dL      Peak postprandial:   less than 180 mg/dL (1-2 hours)      Critically ill patients:  140 - 180 mg/dL   Lab Results  Component Value Date   GLUCAP 381 (H) 03/04/2023   HGBA1C 13.1 (H) 02/26/2023    Review of Glycemic Control  Current orders for Inpatient glycemic control:  Semglee 18 units qhs Novolog 0-15 units tid + hs  Glucerna tid between meals  Inpatient Diabetes Program Recommendations:    -   Add Novolog 3 units tid meal coverage if eating >50% of meals.   Discharge Recommendations: Long acting recommendations: Insulin Glargine (LANTUS) Solostar Pen 18 units QHS  Short acting recommendations:  Meal coverage ONLY Insulin aspart (NOVOLOG) FlexPen  3 units TID with meals if eating full meal   Supply/Referral recommendations: Glucometer Test strips Lancet device Lancets Pen needles - standard   Use Adult Diabetes Insulin Treatment Post Discharge order set.  Thanks,  Christena Deem RN, MSN, BC-ADM Inpatient Diabetes Coordinator Team Pager [phone] (8a-5p)

## 2023-03-04 NOTE — Progress Notes (Signed)
VASCULAR LAB    Vein mapping has been performed.  See CV proc for preliminary results.   Roben Schliep, RVT 03/04/2023, 3:00 PM

## 2023-03-04 NOTE — Plan of Care (Signed)

## 2023-03-04 NOTE — Progress Notes (Signed)
  Progress Note    03/04/2023 10:58 AM Day of Surgery  Subjective: Resting comfortably  Vitals:   03/04/23 0807 03/04/23 1044  BP: 126/74   Pulse: 69   Resp: 18   Temp: 98.3 F (36.8 C)   SpO2: 99% 96%    Physical Exam: Awake alert and oriented Bilateral common femoral pulses palpable Dressing on left foot  CBC    Component Value Date/Time   WBC 7.5 03/04/2023 0358   RBC 3.84 (L) 03/04/2023 0358   HGB 11.3 (L) 03/04/2023 0358   HCT 35.0 (L) 03/04/2023 0358   PLT 205 03/04/2023 0358   MCV 91.1 03/04/2023 0358   MCH 29.4 03/04/2023 0358   MCHC 32.3 03/04/2023 0358   RDW 13.0 03/04/2023 0358   LYMPHSABS 3.3 02/26/2023 1110   MONOABS 1.3 (H) 02/26/2023 1110   EOSABS 0.3 02/26/2023 1110   BASOSABS 0.1 02/26/2023 1110    BMET    Component Value Date/Time   NA 134 (L) 03/04/2023 0358   K 3.7 03/04/2023 0358   CL 99 03/04/2023 0358   CO2 28 03/04/2023 0358   GLUCOSE 483 (H) 03/04/2023 0358   BUN 13 03/04/2023 0358   CREATININE 0.84 03/04/2023 0358   CREATININE 0.71 03/09/2014 0916   CALCIUM 8.2 (L) 03/04/2023 0358   GFRNONAA >60 03/04/2023 0358   GFRNONAA >89 03/09/2014 0916   GFRAA >89 03/09/2014 0916    INR No results found for: "INR"   Intake/Output Summary (Last 24 hours) at 03/04/2023 1058 Last data filed at 03/04/2023 0310 Gross per 24 hour  Intake 772 ml  Output --  Net 772 ml     Assessment/plan:  62 y.o. female is here with left foot wound with severely depressed bilateral ABIs and toe pressures of 0.  Plan for angiogram today in the Cath Lab from right common femoral approach.    Victorino Sparrow MD [facility] [place] Office: [phone] Pager: [phone]  03/04/2023 10:58 AM

## 2023-03-04 NOTE — Op Note (Signed)
    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 Randie Heinz, MD Procedure Performed: 1.  Ultrasound-guided cannulation right common femoral artery  2.  Catheter aorta and aortogram 3.  Catheter left common femoral artery left lower extremity angiogram 4.  Right lower extremity angiogram 5.  Moderate sedation with fentanyl and Versed for 18 minutes   Indications: 62 year old female with known peripheral arterial disease left first metatarsal wound with severely depressed toe pressures bilaterally indicated for angiography with possible invention.  Findings: Bilateral common femoral arteries are calcified.  The aorta and iliac segments are diminutive although free of flow-limiting stenosis throughout the bilateral common and external iliac arteries and the bilateral hypogastric arteries are patent.  SFAs are flush occluded bilaterally and reconstitutes above the knee popliteal arteries which are diminutive but three-vessel runoff to the foot.  Plan will be for left common femoral to above-knee versus below-knee popliteal artery bypass during this hospitalization.   Procedure:  The patient was identified in the holding area and taken to room 8.  The patient was then placed supine on the table and prepped and draped in the usual sterile fashion.  A time out was called.  Ultrasound was used to evaluate the right common femoral artery which was noted be patent although heavily calcified.  The area was anesthetized and cannulated with a micropuncture wire and a micropuncture sheath.  An ultrasound image was saved to the permanent record.  Concomitantly we administered fentanyl and Versed moderate sedation and her vital signs were monitored throughout the case by bedside nursing.  We placed a Bentson wire followed by 5  French sheath and Omni catheter to the level of L1 and aortogram was performed.  We crossed the bifurcation with Omni catheter and Bentson wire perform left lower extremity angiography.  With the above findings she will need surgical intervention.  The catheter was removed over the wire and right lower extremity angiography was performed through the sheath.  The sheath will be pulled in postoperative holding.  The patient tolerated the procedure without any complication.  Contrast: 40 cc   Starlett Pehrson C. Randie Heinz, MD [facility] [place] Office: [phone] Pager: [phone]

## 2023-03-04 NOTE — Plan of Care (Signed)
Patient alert, oriented and anxious about having angiogram. Patient given encouragement. Medicated for pain with relief. Resting comfortably. Will continue to monitor.  Problem: Coping: Goal: Level of anxiety will decrease Outcome: Progressing   Problem: Pain Managment: Goal: General experience of comfort will improve Outcome: Progressing   Problem: Skin Integrity: Goal: Risk for impaired skin integrity will decrease Outcome: Progressing

## 2023-03-05 ENCOUNTER — Encounter (HOSPITAL_COMMUNITY)
Admission: EM | Disposition: A | Discharge status: Home / Self Care | Payer: Self-pay | Source: Home / Self Care | Attending: Internal Medicine

## 2023-03-05 ENCOUNTER — Inpatient Hospital Stay (HOSPITAL_COMMUNITY): Payer: Medicaid Other

## 2023-03-05 ENCOUNTER — Encounter (HOSPITAL_COMMUNITY): Payer: Self-pay | Admitting: Internal Medicine

## 2023-03-05 ENCOUNTER — Other Ambulatory Visit: Payer: Self-pay

## 2023-03-05 ENCOUNTER — Inpatient Hospital Stay (HOSPITAL_COMMUNITY): Admission: RE | Admit: 2023-03-05 | Payer: Medicaid Other | Source: Home / Self Care | Admitting: Vascular Surgery

## 2023-03-05 DIAGNOSIS — D696 Thrombocytopenia, unspecified: Secondary | ICD-10-CM

## 2023-03-05 DIAGNOSIS — L0390 Cellulitis, unspecified: Secondary | ICD-10-CM | POA: Diagnosis not present

## 2023-03-05 DIAGNOSIS — L03116 Cellulitis of left lower limb: Secondary | ICD-10-CM | POA: Diagnosis not present

## 2023-03-05 DIAGNOSIS — E1151 Type 2 diabetes mellitus with diabetic peripheral angiopathy without gangrene: Secondary | ICD-10-CM

## 2023-03-05 DIAGNOSIS — E43 Unspecified severe protein-calorie malnutrition: Secondary | ICD-10-CM | POA: Diagnosis not present

## 2023-03-05 DIAGNOSIS — I70245 Atherosclerosis of native arteries of left leg with ulceration of other part of foot: Secondary | ICD-10-CM | POA: Diagnosis not present

## 2023-03-05 HISTORY — PX: FEMORAL-POPLITEAL BYPASS GRAFT: SHX937

## 2023-03-05 HISTORY — PX: ENDARTERECTOMY FEMORAL: SHX5804

## 2023-03-05 LAB — GLUCOSE, CAPILLARY
Glucose-Capillary: 191 mg/dL — ABNORMAL HIGH (ref 70–99)
Glucose-Capillary: 164 mg/dL — ABNORMAL HIGH (ref 70–99)
Glucose-Capillary: 226 mg/dL — ABNORMAL HIGH (ref 70–99)
Glucose-Capillary: 192 mg/dL — ABNORMAL HIGH (ref 70–99)
Glucose-Capillary: 237 mg/dL — ABNORMAL HIGH (ref 70–99)
Glucose-Capillary: 356 mg/dL — ABNORMAL HIGH (ref 70–99)

## 2023-03-05 LAB — TYPE AND SCREEN
ABO/RH(D): A POS
Antibody Screen: NEGATIVE

## 2023-03-05 LAB — SURGICAL PCR SCREEN
MRSA, PCR: NEGATIVE
Staphylococcus aureus: NEGATIVE

## 2023-03-05 LAB — ABO/RH: ABO/RH(D): A POS

## 2023-03-05 SURGERY — BYPASS GRAFT FEMORAL-POPLITEAL ARTERY
Anesthesia: General | Site: Leg Upper | Laterality: Left

## 2023-03-05 MED ORDER — SODIUM CHLORIDE 0.9 % IV SOLN: 500.0000 mL | Freq: Once | INTRAVENOUS | Status: DC | PRN

## 2023-03-05 MED ORDER — HEMOSTATIC AGENTS (NO CHARGE) OPTIME
TOPICAL | Status: DC | PRN
  Administered 2023-03-05: 2 via TOPICAL

## 2023-03-05 MED ORDER — DEXAMETHASONE SODIUM PHOSPHATE 10 MG/ML IJ SOLN
INTRAMUSCULAR | Status: DC | PRN
  Administered 2023-03-05: 5 mg via INTRAVENOUS

## 2023-03-05 MED ORDER — POTASSIUM CHLORIDE CRYS ER 20 MEQ PO TBCR: 20.0000 meq | EXTENDED_RELEASE_TABLET | Freq: Every day | ORAL | Status: DC | PRN

## 2023-03-05 MED ORDER — ONDANSETRON HCL 4 MG/2ML IJ SOLN: 4.0000 mg | Freq: Once | INTRAMUSCULAR | Status: DC | PRN

## 2023-03-05 MED ORDER — INSULIN ASPART 100 UNIT/ML IJ SOLN
8.0000 [IU] | Freq: Once | INTRAMUSCULAR | Status: AC
  Administered 2023-03-05: 8 [IU] via SUBCUTANEOUS

## 2023-03-05 MED ORDER — CHLORHEXIDINE GLUCONATE 0.12 % MT SOLN
OROMUCOSAL | Status: AC
  Filled 2023-03-05: qty 15

## 2023-03-05 MED ORDER — ACETAMINOPHEN 650 MG RE SUPP: 325.0000 mg | RECTAL | Status: DC | PRN

## 2023-03-05 MED ORDER — HEPARIN 6000 UNIT IRRIGATION SOLUTION
Status: AC
  Filled 2023-03-05: qty 500

## 2023-03-05 MED ORDER — CEFAZOLIN SODIUM-DEXTROSE 2-3 GM-%(50ML) IV SOLR
INTRAVENOUS | Status: DC | PRN
  Administered 2023-03-05: 2 g via INTRAVENOUS

## 2023-03-05 MED ORDER — FENTANYL CITRATE (PF) 250 MCG/5ML IJ SOLN
INTRAMUSCULAR | Status: AC
  Filled 2023-03-05: qty 5

## 2023-03-05 MED ORDER — ALUM & MAG HYDROXIDE-SIMETH 200-200-20 MG/5ML PO SUSP: 15.0000 mL | ORAL | Status: DC | PRN

## 2023-03-05 MED ORDER — DOCUSATE SODIUM 100 MG PO CAPS
100.0000 mg | ORAL_CAPSULE | Freq: Every day | ORAL | Status: DC
  Administered 2023-03-06: 100 mg via ORAL
  Filled 2023-03-05 (×2): qty 1

## 2023-03-05 MED ORDER — ONDANSETRON HCL 4 MG/2ML IJ SOLN
INTRAMUSCULAR | Status: DC | PRN
Start: 2023-03-05 — End: 2023-03-05
  Administered 2023-03-05: 4 mg via INTRAVENOUS

## 2023-03-05 MED ORDER — PANTOPRAZOLE SODIUM 40 MG PO TBEC
40.0000 mg | DELAYED_RELEASE_TABLET | Freq: Every day | ORAL | Status: DC
  Administered 2023-03-05 – 2023-03-07 (×3): 40 mg via ORAL
  Filled 2023-03-05 (×3): qty 1

## 2023-03-05 MED ORDER — HYDROMORPHONE HCL 1 MG/ML IJ SOLN
INTRAMUSCULAR | Status: AC
  Filled 2023-03-05: qty 1

## 2023-03-05 MED ORDER — SUGAMMADEX SODIUM 200 MG/2ML IV SOLN
INTRAVENOUS | Status: DC | PRN
  Administered 2023-03-05: 77.2 mg via INTRAVENOUS

## 2023-03-05 MED ORDER — PROPOFOL 10 MG/ML IV BOLUS
INTRAVENOUS | Status: AC
  Filled 2023-03-05: qty 20

## 2023-03-05 MED ORDER — PHENYLEPHRINE HCL-NACL 20-0.9 MG/250ML-% IV SOLN
INTRAVENOUS | Status: DC | PRN
  Administered 2023-03-05: 20 ug/min via INTRAVENOUS

## 2023-03-05 MED ORDER — MAGNESIUM SULFATE 2 GM/50ML IV SOLN: 2.0000 g | Freq: Every day | INTRAVENOUS | Status: DC | PRN

## 2023-03-05 MED ORDER — LACTATED RINGERS IV SOLN: INTRAVENOUS | Status: DC

## 2023-03-05 MED ORDER — CLEVIDIPINE BUTYRATE 0.5 MG/ML IV EMUL
INTRAVENOUS | Status: AC
  Filled 2023-03-05: qty 50

## 2023-03-05 MED ORDER — DEXMEDETOMIDINE HCL IN NACL 80 MCG/20ML IV SOLN
INTRAVENOUS | Status: DC | PRN
  Administered 2023-03-05 (×2): 8 ug via INTRAVENOUS

## 2023-03-05 MED ORDER — SURGIFLO WITH THROMBIN (HEMOSTATIC MATRIX KIT) OPTIME
TOPICAL | Status: DC | PRN
  Administered 2023-03-05: 1 via TOPICAL

## 2023-03-05 MED ORDER — MORPHINE SULFATE (PF) 2 MG/ML IV SOLN
2.0000 mg | INTRAVENOUS | Status: DC | PRN
  Administered 2023-03-05 – 2023-03-06 (×5): 2 mg via INTRAVENOUS
  Filled 2023-03-05 (×5): qty 1

## 2023-03-05 MED ORDER — GUAIFENESIN-DM 100-10 MG/5ML PO SYRP: 15.0000 mL | ORAL_SOLUTION | ORAL | Status: DC | PRN

## 2023-03-05 MED ORDER — INSULIN GLARGINE-YFGN 100 UNIT/ML ~~LOC~~ SOLN
20.0000 [IU] | Freq: Every day | SUBCUTANEOUS | Status: DC
  Administered 2023-03-05: 20 [IU] via SUBCUTANEOUS
  Filled 2023-03-05 (×2): qty 0.2

## 2023-03-05 MED ORDER — CHLORHEXIDINE GLUCONATE 0.12 % MT SOLN: 15.0000 mL | Freq: Once | OROMUCOSAL | Status: AC

## 2023-03-05 MED ORDER — HEPARIN SODIUM (PORCINE) 1000 UNIT/ML IJ SOLN
INTRAMUSCULAR | Status: DC | PRN
  Administered 2023-03-05: 5000 [IU] via INTRAVENOUS

## 2023-03-05 MED ORDER — HEPARIN SODIUM (PORCINE) 5000 UNIT/ML IJ SOLN
5000.0000 [IU] | Freq: Three times a day (TID) | INTRAMUSCULAR | Status: DC
  Administered 2023-03-06 – 2023-03-07 (×4): 5000 [IU] via SUBCUTANEOUS
  Filled 2023-03-05 (×3): qty 1

## 2023-03-05 MED ORDER — ROCURONIUM BROMIDE 10 MG/ML (PF) SYRINGE
PREFILLED_SYRINGE | INTRAVENOUS | Status: DC | PRN
  Administered 2023-03-05: 60 mg via INTRAVENOUS

## 2023-03-05 MED ORDER — PROTAMINE SULFATE 10 MG/ML IV SOLN
INTRAVENOUS | Status: DC | PRN
  Administered 2023-03-05: 5 mg via INTRAVENOUS
  Administered 2023-03-05: 25 mg via INTRAVENOUS

## 2023-03-05 MED ORDER — MIDAZOLAM HCL 2 MG/2ML IJ SOLN
INTRAMUSCULAR | Status: AC
  Filled 2023-03-05: qty 2

## 2023-03-05 MED ORDER — INSULIN ASPART 100 UNIT/ML IJ SOLN: 0.0000 [IU] | INTRAMUSCULAR | Status: DC | PRN

## 2023-03-05 MED ORDER — OXYCODONE HCL 5 MG PO TABS: 5.0000 mg | ORAL_TABLET | Freq: Once | ORAL | Status: DC | PRN

## 2023-03-05 MED ORDER — 0.9 % SODIUM CHLORIDE (POUR BTL) OPTIME
TOPICAL | Status: DC | PRN
  Administered 2023-03-05: 1000 mL

## 2023-03-05 MED ORDER — LABETALOL HCL 5 MG/ML IV SOLN: 10.0000 mg | INTRAVENOUS | Status: DC | PRN

## 2023-03-05 MED ORDER — ALBUMIN HUMAN 5 % IV SOLN: INTRAVENOUS | Status: DC | PRN

## 2023-03-05 MED ORDER — LIDOCAINE 2% (20 MG/ML) 5 ML SYRINGE
INTRAMUSCULAR | Status: DC | PRN
  Administered 2023-03-05: 100 mg via INTRAVENOUS

## 2023-03-05 MED ORDER — METOPROLOL TARTRATE 5 MG/5ML IV SOLN: 2.0000 mg | INTRAVENOUS | Status: DC | PRN

## 2023-03-05 MED ORDER — HYDROMORPHONE HCL 1 MG/ML IJ SOLN
0.2500 mg | INTRAMUSCULAR | Status: DC | PRN
  Administered 2023-03-05 (×2): 0.5 mg via INTRAVENOUS

## 2023-03-05 MED ORDER — OXYCODONE HCL 5 MG/5ML PO SOLN: 5.0000 mg | Freq: Once | ORAL | Status: DC | PRN

## 2023-03-05 MED ORDER — EPHEDRINE SULFATE-NACL 50-0.9 MG/10ML-% IV SOSY
PREFILLED_SYRINGE | INTRAVENOUS | Status: DC | PRN
  Administered 2023-03-05 (×2): 10 mg via INTRAVENOUS

## 2023-03-05 MED ORDER — LACTATED RINGERS IV SOLN: INTRAVENOUS | Status: DC | PRN

## 2023-03-05 MED ORDER — CLOPIDOGREL BISULFATE 75 MG PO TABS
75.0000 mg | ORAL_TABLET | Freq: Every day | ORAL | Status: DC
  Administered 2023-03-06 – 2023-03-07 (×2): 75 mg via ORAL
  Filled 2023-03-05 (×3): qty 1

## 2023-03-05 MED ORDER — FENTANYL CITRATE (PF) 250 MCG/5ML IJ SOLN
INTRAMUSCULAR | Status: DC | PRN
  Administered 2023-03-05 (×4): 50 ug via INTRAVENOUS

## 2023-03-05 MED ORDER — ACETAMINOPHEN 325 MG PO TABS: 325.0000 mg | ORAL_TABLET | ORAL | Status: DC | PRN

## 2023-03-05 MED ORDER — SODIUM CHLORIDE 0.9 % IV SOLN: INTRAVENOUS | Status: DC

## 2023-03-05 MED ORDER — HEPARIN 6000 UNIT IRRIGATION SOLUTION
Status: DC | PRN
  Administered 2023-03-05: 1

## 2023-03-05 MED ORDER — ACETAMINOPHEN 10 MG/ML IV SOLN: 1000.0000 mg | Freq: Once | INTRAVENOUS | Status: DC | PRN

## 2023-03-05 MED ORDER — ORAL CARE MOUTH RINSE: 15.0000 mL | Freq: Once | OROMUCOSAL | Status: AC

## 2023-03-05 MED ORDER — CEFAZOLIN SODIUM-DEXTROSE 2-4 GM/100ML-% IV SOLN
2.0000 g | Freq: Three times a day (TID) | INTRAVENOUS | Status: AC
  Administered 2023-03-05 – 2023-03-06 (×2): 2 g via INTRAVENOUS
  Filled 2023-03-05 (×2): qty 100

## 2023-03-05 MED ORDER — PROPOFOL 10 MG/ML IV BOLUS
INTRAVENOUS | Status: DC | PRN
  Administered 2023-03-05: 130 mg via INTRAVENOUS

## 2023-03-05 SURGICAL SUPPLY — 63 items
ADH SKN CLS APL DERMABOND .7 (GAUZE/BANDAGES/DRESSINGS) ×2
AGENT HMST KT MTR STRL THRMB (HEMOSTASIS) ×2
BAG COUNTER SPONGE SURGICOUNT (BAG) ×2 IMPLANT
BAG SPNG CNTER NS LX DISP (BAG) ×2
BANDAGE ESMARK 6X9 LF (GAUZE/BANDAGES/DRESSINGS) IMPLANT
BLADE CLIPPER SURG (BLADE) ×2 IMPLANT
BNDG CMPR 5X4 KNIT ELC UNQ LF (GAUZE/BANDAGES/DRESSINGS) ×2
BNDG CMPR 9X6 STRL LF SNTH (GAUZE/BANDAGES/DRESSINGS)
BNDG ELASTIC 4INX 5YD STR LF (GAUZE/BANDAGES/DRESSINGS) IMPLANT
BNDG ESMARK 6X9 LF (GAUZE/BANDAGES/DRESSINGS)
CANISTER SUCT 3000ML PPV (MISCELLANEOUS) ×2 IMPLANT
CLIP FOGARTY SPRING 6M (CLIP) IMPLANT
CLIP TI MEDIUM 24 (CLIP) ×2 IMPLANT
CLIP TI WIDE RED SMALL 24 (CLIP) ×2 IMPLANT
COVER PROBE W GEL 5X96 (DRAPES) ×2 IMPLANT
CUFF TOURN SGL QUICK 24 (TOURNIQUET CUFF)
CUFF TOURN SGL QUICK 34 (TOURNIQUET CUFF)
CUFF TOURN SGL QUICK 42 (TOURNIQUET CUFF) IMPLANT
CUFF TRNQT CYL 24X4X16.5-23 (TOURNIQUET CUFF) IMPLANT
CUFF TRNQT CYL 34X4.125X (TOURNIQUET CUFF) IMPLANT
DERMABOND ADVANCED .7 DNX12 (GAUZE/BANDAGES/DRESSINGS) ×2 IMPLANT
DRAIN CHANNEL 15F RND FF W/TCR (WOUND CARE) IMPLANT
DRAIN RELI 100 BL SUC LF ST (DRAIN)
DRAPE C-ARM 42X72 X-RAY (DRAPES) IMPLANT
ELECT REM PT RETURN 9FT ADLT (ELECTROSURGICAL) ×2
ELECTRODE REM PT RTRN 9FT ADLT (ELECTROSURGICAL) ×2 IMPLANT
EVACUATOR SILICONE 100CC (DRAIN) IMPLANT
GAUZE SPONGE 4X4 12PLY STRL (GAUZE/BANDAGES/DRESSINGS) IMPLANT
GLOVE BIOGEL PI IND STRL 8 (GLOVE) ×2 IMPLANT
GOWN STRL REUS W/ TWL LRG LVL3 (GOWN DISPOSABLE) ×4 IMPLANT
GOWN STRL REUS W/TWL 2XL LVL3 (GOWN DISPOSABLE) ×4 IMPLANT
GOWN STRL REUS W/TWL LRG LVL3 (GOWN DISPOSABLE) ×4
GRAFT PROPATEN W/RING 6X80X60 (Vascular Products) IMPLANT
HEMOSTAT SNOW SURGICEL 2X4 (HEMOSTASIS) IMPLANT
INSERT FOGARTY SM (MISCELLANEOUS) IMPLANT
KIT BASIN OR (CUSTOM PROCEDURE TRAY) ×2 IMPLANT
KIT TURNOVER KIT B (KITS) ×2 IMPLANT
LOOP VASCULAR MINI 18 RED (MISCELLANEOUS) ×4
NS IRRIG 1000ML POUR BTL (IV SOLUTION) ×4 IMPLANT
PACK PERIPHERAL VASCULAR (CUSTOM PROCEDURE TRAY) ×2 IMPLANT
PAD ARMBOARD 7.5X6 YLW CONV (MISCELLANEOUS) ×4 IMPLANT
SET MICROPUNCTURE 5F STIFF (MISCELLANEOUS) IMPLANT
STOPCOCK 4 WAY LG BORE MALE ST (IV SETS) IMPLANT
SURGIFLO W/THROMBIN 8M KIT (HEMOSTASIS) IMPLANT
SUT ETHILON 3 0 PS 1 (SUTURE) IMPLANT
SUT MNCRL AB 4-0 PS2 18 (SUTURE) ×6 IMPLANT
SUT PROLENE 5 0 C 1 24 (SUTURE) ×2 IMPLANT
SUT PROLENE 6 0 BV (SUTURE) ×2 IMPLANT
SUT PROLENE 7 0 BV 1 (SUTURE) IMPLANT
SUT SILK 2 0 PERMA HAND 18 BK (SUTURE) IMPLANT
SUT SILK 3 0 (SUTURE)
SUT SILK 3-0 18XBRD TIE 12 (SUTURE) IMPLANT
SUT VIC AB 2-0 CT1 27 (SUTURE) ×4
SUT VIC AB 2-0 CT1 TAPERPNT 27 (SUTURE) ×4 IMPLANT
SUT VIC AB 3-0 SH 27 (SUTURE) ×6
SUT VIC AB 3-0 SH 27X BRD (SUTURE) ×6 IMPLANT
TAPE UMBILICAL 1/8X30 (MISCELLANEOUS) IMPLANT
TOWEL GREEN STERILE (TOWEL DISPOSABLE) ×2 IMPLANT
TRAY FOLEY MTR SLVR 16FR STAT (SET/KITS/TRAYS/PACK) ×2 IMPLANT
TUBING EXTENTION W/L.L. (IV SETS) IMPLANT
UNDERPAD 30X36 HEAVY ABSORB (UNDERPADS AND DIAPERS) ×2 IMPLANT
VASCULAR TIE MINI RED 18IN STL (MISCELLANEOUS) IMPLANT
WATER STERILE IRR 1000ML POUR (IV SOLUTION) ×2 IMPLANT

## 2023-03-05 NOTE — Anesthesia Procedure Notes (Signed)
Procedure Name: Intubation Date/Time: 03/05/2023 7:48 AM  Performed by: Loleta Kenson Groh, CRNAPre-anesthesia Checklist: Patient identified, Patient being monitored, Timeout performed, Emergency Drugs available and Suction available Patient Re-evaluated:Patient Re-evaluated prior to induction Oxygen Delivery Method: Circle system utilized Preoxygenation: Pre-oxygenation with 100% oxygen Induction Type: IV induction Ventilation: Mask ventilation without difficulty Laryngoscope Size: Mac and 3 Grade View: Grade I Tube type: Oral Tube size: 6.5 mm Number of attempts: 1 Airway Equipment and Method: Stylet Placement Confirmation: ETT inserted through vocal cords under direct vision, positive ETCO2 and breath sounds checked- equal and bilateral Secured at: 21 cm Tube secured with: Tape Dental Injury: Teeth and Oropharynx as per pre-operative assessment

## 2023-03-05 NOTE — Anesthesia Procedure Notes (Signed)
Arterial Line Insertion Start/End8/20/2024 7:20 AM, 03/05/2023 7:30 AM Performed by: Garfield Cornea, CRNA, CRNA  Patient location: Pre-op. Preanesthetic checklist: patient identified, IV checked, site marked, risks and benefits discussed, surgical consent, monitors and equipment checked, pre-op evaluation, timeout performed and anesthesia consent Lidocaine 1% used for infiltration radial was placed Catheter size: 20 G Hand hygiene performed  and maximum sterile barriers used   Attempts: 1 Procedure performed without using ultrasound guided technique. Following insertion, dressing applied. Post procedure assessment: normal and unchanged

## 2023-03-05 NOTE — Anesthesia Postprocedure Evaluation (Signed)
Anesthesia Post Note  Patient: Naysha Tuffy  Procedure(s) Performed: LEFT FEMORAL-ABOVE KNEE POPLITEAL ARTERY BYPASS GRAFT USING PROPATEN RING GRAFT X 80CM (Left: Leg Upper) ILIOFEMORAL ENDARTERECTOMY (Left: Groin)     Patient location during evaluation: PACU Anesthesia Type: General Level of consciousness: awake and alert Pain management: pain level controlled Vital Signs Assessment: post-procedure vital signs reviewed and stable Respiratory status: spontaneous breathing, nonlabored ventilation, respiratory function stable and patient connected to nasal cannula oxygen Cardiovascular status: blood pressure returned to baseline and stable Postop Assessment: no apparent nausea or vomiting Anesthetic complications: no  No notable events documented.  Last Vitals:  Vitals:   03/05/23 1200 03/05/23 1215  BP: 109/65 114/67  Pulse: 80 71  Resp: 20 13  Temp:    SpO2: 100% 97%    Last Pain:  Vitals:   03/05/23 1215  TempSrc:   PainSc: Asleep                 Hayley Horn S

## 2023-03-05 NOTE — Anesthesia Preprocedure Evaluation (Addendum)
Anesthesia Evaluation  Patient identified by MRN, date of birth, ID band Patient awake    Reviewed: Allergy & Precautions, H&P , NPO status , Patient's Chart, lab work & pertinent test results  Airway Mallampati: II  TM Distance: >3 FB Neck ROM: Full    Dental no notable dental hx.    Pulmonary Current Smoker   Pulmonary exam normal breath sounds clear to auscultation       Cardiovascular + Peripheral Vascular Disease  Normal cardiovascular exam Rhythm:Regular Rate:Normal     Neuro/Psych negative neurological ROS  negative psych ROS   GI/Hepatic negative GI ROS,,,(+)     substance abuse  cocaine use  Endo/Other  diabetes, Poorly Controlled    Renal/GU negative Renal ROS  negative genitourinary   Musculoskeletal negative musculoskeletal ROS (+)    Abdominal   Peds negative pediatric ROS (+)  Hematology negative hematology ROS (+)   Anesthesia Other Findings   Reproductive/Obstetrics negative OB ROS                             Anesthesia Physical Anesthesia Plan  ASA: 4  Anesthesia Plan: General   Post-op Pain Management:    Induction: Intravenous  PONV Risk Score and Plan: 2 and Ondansetron, Dexamethasone and Treatment may vary due to age or medical condition  Airway Management Planned: Oral ETT  Additional Equipment: Arterial line  Intra-op Plan:   Post-operative Plan: Extubation in OR  Informed Consent: I have reviewed the patients History and Physical, chart, labs and discussed the procedure including the risks, benefits and alternatives for the proposed anesthesia with the patient or authorized representative who has indicated his/her understanding and acceptance.     Dental advisory given  Plan Discussed with: CRNA and Surgeon  Anesthesia Plan Comments:        Anesthesia Quick Evaluation

## 2023-03-05 NOTE — Transfer of Care (Signed)
Immediate Anesthesia Transfer of Care Note  Patient: Lakiva Grantham  Procedure(s) Performed: LEFT FEMORAL-ABOVE KNEE POPLITEAL ARTERY BYPASS GRAFT USING PROPATEN RING GRAFT X 80CM (Left: Leg Upper) ILIOFEMORAL ENDARTERECTOMY (Left: Groin)  Patient Location: PACU  Anesthesia Type:General  Level of Consciousness: awake  Airway & Oxygen Therapy: Patient Spontanous Breathing  Post-op Assessment: Report given to RN and Post -op Vital signs reviewed and stable  Post vital signs: Reviewed and stable  Last Vitals:  Vitals Value Taken Time  BP 119/77 03/05/23 1027  Temp    Pulse 83 03/05/23 1028  Resp 14 03/05/23 1028  SpO2 96 % 03/05/23 1028  Vitals shown include unfiled device data.  Last Pain:  Vitals:   03/05/23 0701  TempSrc: Oral  PainSc: 0-No pain      Patients Stated Pain Goal: 0 (03/04/23 1216)  Complications: No notable events documented.

## 2023-03-05 NOTE — Progress Notes (Signed)
  Progress Note    03/05/2023 7:19 AM Day of Surgery  Subjective: Resting comfortably, no complaints  Vitals:   03/05/23 0644 03/05/23 0701  BP: 114/65 104/64  Pulse: 68 69  Resp: 17 18  Temp: 98 F (36.7 C) 98.2 F (36.8 C)  SpO2: 98% 98%    Physical Exam: Awake alert and oriented Bilateral common femoral pulses palpable Dressing on left foot  CBC    Component Value Date/Time   WBC 7.5 03/04/2023 0358   RBC 3.84 (L) 03/04/2023 0358   HGB 11.3 (L) 03/04/2023 0358   HCT 35.0 (L) 03/04/2023 0358   PLT 205 03/04/2023 0358   MCV 91.1 03/04/2023 0358   MCH 29.4 03/04/2023 0358   MCHC 32.3 03/04/2023 0358   RDW 13.0 03/04/2023 0358   LYMPHSABS 3.3 02/26/2023 1110   MONOABS 1.3 (H) 02/26/2023 1110   EOSABS 0.3 02/26/2023 1110   BASOSABS 0.1 02/26/2023 1110    BMET    Component Value Date/Time   NA 134 (L) 03/04/2023 0358   K 3.7 03/04/2023 0358   CL 99 03/04/2023 0358   CO2 28 03/04/2023 0358   GLUCOSE 483 (H) 03/04/2023 0358   BUN 13 03/04/2023 0358   CREATININE 0.84 03/04/2023 0358   CREATININE 0.71 03/09/2014 0916   CALCIUM 8.2 (L) 03/04/2023 0358   GFRNONAA >60 03/04/2023 0358   GFRNONAA >89 03/09/2014 0916   GFRAA >89 03/09/2014 0916    INR No results found for: "INR"   Intake/Output Summary (Last 24 hours) at 03/05/2023 0719 Last data filed at 03/05/2023 0300 Gross per 24 hour  Intake 666.45 ml  Output --  Net 666.45 ml     Assessment/plan:  62 y.o. female is here with left foot wound with severely depressed bilateral ABIs and toe pressures of 0.  Angiogram with flush SFA occlusion. Needs Femoral to above knee bypass for critical limb ischemia with tissue loss.  After discussing the risks and benefits of the above, Nisaa elected to proceed.     Victorino Sparrow MD

## 2023-03-05 NOTE — Progress Notes (Signed)
Progress Note   Patient: Martha Lozano NFA:[id number] DOB: August 17, 1960 DOA: 02/25/2023     7 DOS: the patient was seen and examined on 03/05/2023   Brief hospital course: 62 year old female with breast cancer, type II DM, neuropathy, hep C, substance abuse presents to the hospital with complaints of left foot pain. Found to have bilateral PAD with nonhealing left foot ulcer., left leg cellulitis with small abscess without any evidence of osteomyelitis or septic arthritis. Vascular surgery consulted for PAD.  Vascular surgery scheduled for left lower extremity angiogram on 03/04/23 for revascularization  8/19: S/p angiography today with vascular surgery, found to have extensive disease, they are planning bypass during current hospitalization.  8/20: Vital stable, s/p left femoral to above-knee bypass with vascular surgery.  Doppler positive left foot pulses.   Assessment and Plan:    Cellulitis of left lower extremity, left foot nonhealing ulcer (base of the left great toe and left heel) in the setting of uncontrolled diabetes mellitus, severe PAD -MRI negative for any osteomyelitis - Small 16 mm subcutaneous fluid collection seen on CT scan. -Lower extremity angiography with extensive PAD, patient will need bypass during current hospitalization. -Initially received vancomycin which was later switched with Zyvox-continue Zyvox -Blood cultures negative till date  Severe PAD.  S/p lower extremity angiography followed by left lower extremity femoral to above-knee popliteal bypass today. Doppler palpable pulses in left lower extremity after the procedure. -Continue aspirin and Plavix -Follow-up vascular surgery recommendations   Diabetes mellitus type 2, with hyperglycemia:NIDDM diabetic foot ulcer, severe PAD:  CBG remained elevated Increase Semglee to 20 units at bedtime -Continue SSI prn, f/u FSBS -Hemoglobin A1c 13.1 - On glipizide and metformin at home, on hold. -Pt will need  insulin on discharge  Chronic pain syndrome. - Patient used to see pain clinic for Suboxone therapy, has not seen them since May 2024. Outpatient follow-up recommended.  Hypokalemia: replaced, resolved   Hypothyroidism. -Continue Synthroid. -TSH normal  Essential hypertension -Normotensive -Resume losartan but at a lower dose.   Mood disorder. Continue Effexor.   Substance abuse  UDS positive for opiates and cocaine. Patient did have opioids prior to UDS in the hospital.  Avoid beta-blockers.   Severe protein calorie malnutrition, underweight  Etiology: social / environmental circumstances (polysubstance abuse) Signs/Symptoms: severe fat depletion, severe muscle depletion Interventions: MVI, Glucerna shake, Refer to RD note for recommendations Estimated body mass index is 16.6 kg/m as calculated from the following:   Height as of an earlier encounter on 02/25/23: 5' (1.524 m).   Weight as of an earlier encounter on 02/25/23: 38.6 kg. -Dietitian consult   Nicotine Dependence Smoking cessation has been discussed with patient in detail. She declined a nicotine transdermal patch.      Subjective: Patient was seen after the bypass today.  Denies any pain, tingling or numbness. Marland Kitchen Physical Exam: Vitals:   03/05/23 1100 03/05/23 1115 03/05/23 1130 03/05/23 1149  BP: 122/65 126/63 126/68 119/78  Pulse: 78 80 80 75  Resp: 18 15 14 16   Temp:   98 F (36.7 C) 98.7 F (37.1 C)  TempSrc:    Oral  SpO2: 95% 94% 94% 99%  Weight:      Height:       General.  Severely malnourished lady, in no acute distress. Pulmonary.  Lungs clear bilaterally, normal respiratory effort. CV.  Regular rate and rhythm, no JVD, rub or murmur. Abdomen.  Soft, nontender, nondistended, BS positive. CNS.  Alert and oriented .  No focal neurologic  deficit. Extremities.  No edema, no cyanosis, Doppler positive pulses on left foot, 1+ on right. Psychiatry.  Judgment and insight appears normal.         Data Reviewed: Prior data reviewed  Family Communication:   Disposition: Status is: Inpatient   Planned Discharge Destination: Home  DVT prophylaxis.  Lovenox Time spent: 45 minutes  This record has been created using Conservation officer, historic buildings. Errors have been sought and corrected,but may not always be located. Such creation errors do not reflect on the standard of care.   Author: Arnetha Courser, MD 03/05/2023 12:08 PM  For on call review [email or web address].

## 2023-03-05 NOTE — Op Note (Signed)
NAME: Martha Lozano    MRN: [id number] DOB: 1960/10/14    DATE OF OPERATION: 03/05/2023  PREOP DIAGNOSIS:    Left leg critical limb ischemia with tissue loss at the left great toe and heel  POSTOP DIAGNOSIS:    Same  PROCEDURE:    Left iliofemoral endarterectomy Left femoral to above knee popliteal artery with 6mm PTFE tunneled subfascial  SURGEON: Victorino Sparrow  ASSIST: Emilie Rutter, PA  ANESTHESIA: General   EBL:  INDICATIONS:    Martha Lozano is a 62 y.o. female who presents with left leg critical limb ischemia with tissue loss of the left great toe and heel.  She was taken for angiography and noted to have a flush occlusion of the superficial femoral artery.  Martha Lozano has a history of uncontrolled diabetes with an A1c of 13, as well as cocaine abuse.  We discussed the importance of managing both of these as this will directly impact wound healing and bypass patency.  After discussing the risks and benefits of left femoral to above-knee popliteal artery bypass using 6 mm ringed PTFE, Martha Lozano elected to proceed.  FINDINGS:   60% stenosis of the left distal external neck artery, common femoral artery requiring endarterectomy Healthy above-knee popliteal artery  TECHNIQUE:   Patient was brought to the OR laid in the supine position.  General anesthesia was induced and the patient was prepped and draped in standard fashion.  The case began with ultrasound insonation of the left common femoral artery.  The bifurcation was marked.  An oblique incision was made in the groin, and carried down to the common femoral artery.  This was exposed and the distal external iliac, profunda, superficial femoral arteries were controlled with use of Vesseloops.  There were 2 profunda branches coming off of the common femoral artery.  Next, my attention turned to the above-knee popliteal artery.  An 8 cm longitudinal incision was made on the medial aspect of the thigh.  This was  carried down to the above-knee popliteal artery.  It was soft.  The vessel was dissected and controlled with the use of Vesseloops.   A 6 mm Gore tunneler was brought onto the field and a tunnel made in the anatomic plane from the above-knee pop to the common femoral artery.  A 6 mm ringed PTFE bypass was then run through this tract.  The patient was heparinized to an ACT greater than 250, and the left external iliac artery, profunda, superficial femoral artery were clamped.  Next, the artery was opened longitudinally and iliofemoral endarterectomy followed.  Next, the graft was beveled, and sewn into place using 6-0 Prolene suture in running fashion.   Once complete, arteries were backbled through the graft, and graft flushed.  Next, my attention turned to the distal anastomosis.  The artery was controlled with vessel clamps, and opened via a longitudinal arteriotomy.  The graft was cut to length, and beveled and sewn in end-to-side fashion using running 6-0 Prolene suture.  Prior to completion, the graft was bled, and popliteal artery backbled.  Upon completion, Martha Lozano had a palpable dorsalis pedis and posterior tibial pulse in the foot.  Heparin was reversed, and hemostasis achieved with use of thrombin product and cautery.  Wound beds were irrigated with copious amounts of saline and closed in layers using 2-0 Vicryl suture with Monocryl and Dermabond at the level of the skin.    Martha Snide, MD [facility] DATE OF DICTATION:  03/05/2023  

## 2023-03-05 NOTE — Progress Notes (Signed)
  Progress Note    03/05/2023 4:14 PM Day of Surgery  Subjective:  L groin incision pain   Vitals:   03/05/23 1200 03/05/23 1215  BP: 109/65 114/67  Pulse: 80 71  Resp: 20 13  Temp:    SpO2: 100% 97%   Physical Exam: Lungs:  non labored Incisions:  L groin c/d/I without hematoma Extremities:  palpable L DP pulse Neurologic: A&O  CBC    Component Value Date/Time   WBC 7.5 03/04/2023 0358   RBC 3.84 (L) 03/04/2023 0358   HGB 11.3 (L) 03/04/2023 0358   HCT 35.0 (L) 03/04/2023 0358   PLT 205 03/04/2023 0358   MCV 91.1 03/04/2023 0358   MCH 29.4 03/04/2023 0358   MCHC 32.3 03/04/2023 0358   RDW 13.0 03/04/2023 0358   LYMPHSABS 3.3 02/26/2023 1110   MONOABS 1.3 (H) 02/26/2023 1110   EOSABS 0.3 02/26/2023 1110   BASOSABS 0.1 02/26/2023 1110    BMET    Component Value Date/Time   NA 134 (L) 03/04/2023 0358   K 3.7 03/04/2023 0358   CL 99 03/04/2023 0358   CO2 28 03/04/2023 0358   GLUCOSE 483 (H) 03/04/2023 0358   BUN 13 03/04/2023 0358   CREATININE 0.84 03/04/2023 0358   CREATININE 0.71 03/09/2014 0916   CALCIUM 8.2 (L) 03/04/2023 0358   GFRNONAA >60 03/04/2023 0358   GFRNONAA >89 03/09/2014 0916   GFRAA >89 03/09/2014 0916    INR No results found for: "INR"   Intake/Output Summary (Last 24 hours) at 03/05/2023 1614 Last data filed at 03/05/2023 1018 Gross per 24 hour  Intake 1946.45 ml  Output 250 ml  Net 1696.45 ml     Assessment/Plan:  62 y.o. female is s/p L femoral to AK popliteal w PTFE Day of Surgery   L foot well perfused with palpable L DP Continue ACE wrap overnight Avoid pressure to L heel; pillow placed under the calf Plavix starting tomorrow   Emilie Rutter, PA-C [facility] [phone] 03/05/2023 4:14 PM

## 2023-03-06 ENCOUNTER — Encounter (HOSPITAL_COMMUNITY): Payer: Self-pay | Admitting: Vascular Surgery

## 2023-03-06 DIAGNOSIS — L03116 Cellulitis of left lower limb: Secondary | ICD-10-CM | POA: Diagnosis not present

## 2023-03-06 LAB — BASIC METABOLIC PANEL
Anion gap: 8 (ref 5–15)
BUN: 11 mg/dL (ref 8–23)
CO2: 26 mmol/L (ref 22–32)
Calcium: 7.9 mg/dL — ABNORMAL LOW (ref 8.9–10.3)
Chloride: 99 mmol/L (ref 98–111)
Creatinine, Ser: 0.71 mg/dL (ref 0.44–1.00)
GFR, Estimated: >60 mL/min (ref >60)
Glucose, Bld: 315 mg/dL — ABNORMAL HIGH (ref 70–99)
Potassium: 3.7 mmol/L (ref 3.5–5.1)
Sodium: 133 mmol/L — ABNORMAL LOW (ref 135–145)

## 2023-03-06 LAB — GLUCOSE, CAPILLARY
Glucose-Capillary: 278 mg/dL — ABNORMAL HIGH (ref 70–99)
Glucose-Capillary: 342 mg/dL — ABNORMAL HIGH (ref 70–99)
Glucose-Capillary: 217 mg/dL — ABNORMAL HIGH (ref 70–99)
Glucose-Capillary: 209 mg/dL — ABNORMAL HIGH (ref 70–99)

## 2023-03-06 LAB — POCT I-STAT 7, (LYTES, BLD GAS, ICA,H+H)
Acid-Base Excess: 1 mmol/L (ref 0.0–2.0)
Bicarbonate: 25.4 mmol/L (ref 20.0–28.0)
Calcium, Ion: 1.15 mmol/L (ref 1.15–1.40)
HCT: 35 % — ABNORMAL LOW (ref 36.0–46.0)
Hemoglobin: 11.9 g/dL — ABNORMAL LOW (ref 12.0–15.0)
O2 Saturation: 100 %
Potassium: 3.8 mmol/L (ref 3.5–5.1)
Sodium: 139 mmol/L (ref 135–145)
TCO2: 27 mmol/L (ref 22–32)
pCO2 arterial: 39 mmHg (ref 32–48)
pH, Arterial: 7.423 (ref 7.35–7.45)
pO2, Arterial: 294 mmHg — ABNORMAL HIGH (ref 83–108)

## 2023-03-06 LAB — CBC
HCT: 29 % — ABNORMAL LOW (ref 36.0–46.0)
Hemoglobin: 9.5 g/dL — ABNORMAL LOW (ref 12.0–15.0)
MCH: 30.2 pg (ref 26.0–34.0)
MCHC: 32.8 g/dL (ref 30.0–36.0)
MCV: 92.1 fL (ref 80.0–100.0)
Platelets: 179 10*3/uL (ref 150–400)
RBC: 3.15 MIL/uL — ABNORMAL LOW (ref 3.87–5.11)
RDW: 13.5 % (ref 11.5–15.5)
WBC: 9.5 10*3/uL (ref 4.0–10.5)
nRBC: 0 % (ref 0.0–0.2)

## 2023-03-06 LAB — POCT ACTIVATED CLOTTING TIME: Activated Clotting Time: 262 s

## 2023-03-06 MED ORDER — INSULIN ASPART 100 UNIT/ML IJ SOLN
4.0000 [IU] | Freq: Three times a day (TID) | INTRAMUSCULAR | Status: DC
  Administered 2023-03-06 – 2023-03-07 (×2): 4 [IU] via SUBCUTANEOUS

## 2023-03-06 MED ORDER — LINEZOLID 600 MG PO TABS
600.0000 mg | ORAL_TABLET | Freq: Two times a day (BID) | ORAL | Status: DC
  Administered 2023-03-06 – 2023-03-07 (×2): 600 mg via ORAL
  Filled 2023-03-06 (×4): qty 1

## 2023-03-06 MED ORDER — INSULIN GLARGINE-YFGN 100 UNIT/ML ~~LOC~~ SOLN
22.0000 [IU] | Freq: Every day | SUBCUTANEOUS | Status: DC
  Administered 2023-03-06: 22 [IU] via SUBCUTANEOUS
  Filled 2023-03-06 (×2): qty 0.22

## 2023-03-06 NOTE — Progress Notes (Signed)
Martha Lozano, Martha Lozano   Brief Narrative:  62 year old female with breast cancer, type II DM, neuropathy, hep C, substance abuse presented with left foot pain.  She was found to have bilateral PAD with nonhealing left foot ulcer with left leg cellulitis with small abscess without any evidence of osteomyelitis or septic arthritis.  She underwent left lower extremity angiogram on 03/04/2023 by vascular surgery: Was found to have extensive disease.  She underwent left iliofemoral endarterectomy and femoral to above-knee popliteal artery bypass by vascular surgery on 03/05/2023.  Assessment & Plan:   Left lower extremity cellulitis with nonhealing ulcer (base of left great toe and left heel) in the setting of uncontrolled diabetes mellitus and severe PAD Severe PAD -MRI negative for any osteomyelitis. -underwent left lower extremity angiogram on 03/04/2023 by vascular surgery: Was found to have extensive disease.  She underwent left iliofemoral endarterectomy and femoral to above-knee popliteal artery bypass by vascular surgery on 03/05/2023.  Follow further vascular surgery recommendations. -Initially on vancomycin: Subsequently switched to Zyvox.  Currently on Zyvox.  Blood cultures negative so far. -Continue aspirin and Plavix -PT eval.  Diabetes mellitus type 2 with hyperglycemia -Continue CBGs with SSI.  Increase long-acting insulin.  Add NovoLog with meals.  Carb modified diet.  Chronic pain syndrome -Lozano needs to be see pain clinic for Suboxone therapy: Has not seen them since May 2024.  Outpatient follow-up.  Hypothyroidism -Continue Synthroid  Essential hypertension -Blood pressure intermittently on the lower side.  Continue low-dose of losartan  GERD -continue PPI  Hyperlipidemia -Continue statin  Mood disorder -Effexor on hold.  Substance abuse -UDS positive for opiates and cocaine.   Avoid beta-blockers.  Severe protein calorie malnutrition -Follow nutrition recommendations  Nicotine dependence -Counseled regarding smoking cessation by prior hospitalist   DVT prophylaxis: Heparin subcutaneous Code Status: Full Family Communication: None at bedside Disposition Plan: Status is: Inpatient Remains inpatient appropriate because: Of severity of illness    Consultants: Vascular surgery  Procedures: As above  Antimicrobials:  Anti-infectives (From admission, onward)    Start     Dose/Rate Route Frequency Ordered Stop   03/06/23 1000  linezolid (ZYVOX) tablet 600 mg        600 mg Oral Every 12 hours 03/06/23 0839     03/05/23 1600  ceFAZolin (ANCEF) IVPB 2g/100 mL premix        2 g 200 mL/hr over 30 Minutes Intravenous Every 8 hours 03/05/23 1149 03/06/23 0102   03/03/23 2200  linezolid (ZYVOX) IVPB 600 mg  Status:  Discontinued        600 mg 300 mL/hr over 60 Minutes Intravenous Every 12 hours 03/03/23 1433 03/06/23 0839   03/03/23 0400  vancomycin (VANCOCIN) IVPB 1000 mg/200 mL premix  Status:  Discontinued        1,000 mg 200 mL/hr over 60 Minutes Intravenous Every 24 hours 03/03/23 0248 03/03/23 1433   02/27/23 0200  vancomycin (VANCOREADY) IVPB 500 mg/100 mL  Status:  Discontinued        500 mg 100 mL/hr over 60 Minutes Intravenous Every 24 hours 02/26/23 0246 03/03/23 0248   02/26/23 0130  vancomycin (VANCOCIN) IVPB 1000 mg/200 mL premix        1,000 mg 200 mL/hr over 60 Minutes Intravenous  Once 02/26/23 0116 02/26/23 0236        Subjective: Lozano seen and examined at bedside.  Complains of intermittent lower extremity pain  but feels better.  Martha fever or vomiting reported.  Objective: Vitals:   03/06/23 0039 03/06/23 0300 03/06/23 0738 03/06/23 1114  BP:  104/74 105/72 120/62  Pulse: 86 77 75 79  Resp: 13 16 16 17   Temp:  98.3 F (36.8 C) 98 F (36.7 C) 98.2 F (36.8 C)  TempSrc:  Oral Oral Oral  SpO2: 99% 99% 100% 100%  Weight:       Height:        Intake/Output Summary (Last 24 hours) at 03/06/2023 1323 Last data filed at 03/06/2023 0740 Gross Lozano 24 hour  Intake 1462.58 ml  Output 2450 ml  Net -987.42 ml   Filed Weights   03/05/23 0644  Weight: 38.6 kg    Examination:  General exam: Appears calm and comfortable.  Looks chronically ill and deconditioned.  On room air.  Very thinly built.  Flat affect. Respiratory system: Bilateral decreased breath sounds at bases Cardiovascular system: S1 & S2 heard, Rate controlled Gastrointestinal system: Abdomen is nondistended, soft and nontender. Normal bowel sounds heard. Extremities: Left lower extremity dressing present.   Data Reviewed: I have personally reviewed following labs and imaging studies  CBC: Recent Labs  Lab 03/01/23 0941 03/03/23 1133 03/04/23 0358 03/06/23 0305  WBC 6.2 6.6 7.5 9.5  HGB 11.7* 11.2* 11.3* 9.5*  HCT 36.6 34.7* 35.0* 29.0*  MCV 90.4 92.0 91.1 92.1  PLT 201 210 205 179   Basic Metabolic Panel: Recent Labs  Lab 03/01/23 0941 03/03/23 1133 03/04/23 0358 03/06/23 0305  NA 141 137 134* 133*  K 3.5 3.8 3.7 3.7  CL 100 99 99 99  CO2 28 28 28 26   GLUCOSE 332* 352* 483* 315*  BUN 8 9 13 11   CREATININE 0.92 0.79 0.84 0.71  CALCIUM 8.6* 8.5* 8.2* 7.9*   GFR: Estimated Creatinine Clearance: 45 mL/min (by C-G formula based on SCr of 0.71 mg/dL). Liver Function Tests: Martha results for input(s): "AST", "ALT", "ALKPHOS", "BILITOT", "PROT", "ALBUMIN" in the last 168 hours. Martha results for input(s): "LIPASE", "AMYLASE" in the last 168 hours. Martha results for input(s): "AMMONIA" in the last 168 hours. Coagulation Profile: Martha results for input(s): "INR", "PROTIME" in the last 168 hours. Cardiac Enzymes: Martha results for input(s): "CKTOTAL", "CKMB", "CKMBINDEX", "TROPONINI" in the last 168 hours. BNP (last 3 results) Martha results for input(s): "PROBNP" in the last 8760 hours. HbA1C: Martha results for input(s): "HGBA1C" in the last 72  hours. CBG: Recent Labs  Lab 03/05/23 1148 03/05/23 1638 03/05/23 2147 03/06/23 0557 03/06/23 1117  GLUCAP 192* 237* 356* 278* 342*   Lipid Profile: Martha results for input(s): "CHOL", "HDL", "LDLCALC", "TRIG", "CHOLHDL", "LDLDIRECT" in the last 72 hours. Thyroid Function Tests: Martha results for input(s): "TSH", "T4TOTAL", "FREET4", "T3FREE", "THYROIDAB" in the last 72 hours. Anemia Panel: Martha results for input(s): "VITAMINB12", "FOLATE", "FERRITIN", "TIBC", "IRON", "RETICCTPCT" in the last 72 hours. Sepsis Labs: Martha results for input(s): "PROCALCITON", "LATICACIDVEN" in the last 168 hours.  Recent Results (from the past 240 hour(s))  Culture, blood (Routine X 2) w Reflex to ID Panel     Status: None   Collection Time: 02/26/23 11:05 AM   Specimen: Right Antecubital; Blood  Result Value Ref Range Status   Specimen Description   Final    RIGHT ANTECUBITAL Performed at [facility], [address] [place], [place] [address]    Special Requests   Final    BOTTLES DRAWN AEROBIC AND ANAEROBIC Blood Culture adequate volume Performed at [facility], [address], [place], [place] [address]    Culture   Final    Martha GROWTH 5 DAYS Performed at [facility], [address] [place], [place] [address]    Report Status 03/03/2023 FINAL  Final  Culture, blood (Routine X 2) w Reflex to ID Panel     Status: None   Collection Time: 02/26/23 11:10 AM   Specimen: BLOOD LEFT ARM  Result Value Ref Range Status   Specimen Description   Final    BLOOD LEFT ARM Performed at [facility], [address] [place], [place] [address]    Special Requests   Final    BOTTLES DRAWN AEROBIC AND ANAEROBIC Blood Culture adequate volume Performed at [facility], [address], [place], [place] [address]    Culture   Final    Martha GROWTH 5 DAYS Performed at [facility], [address], [place], [place] [address]     Report Status 03/03/2023 FINAL  Final  Surgical pcr screen     Status: None   Collection Time: 03/05/23  6:00 AM   Specimen: Nasal Mucosa; Nasal Swab  Result Value Ref Range Status   MRSA, PCR NEGATIVE NEGATIVE Final   Staphylococcus aureus NEGATIVE NEGATIVE Final    Comment: (NOTE) The Xpert SA Assay (FDA approved for NASAL specimens in patients 52 years of age and older), is one component of a comprehensive surveillance program. It is not intended to diagnose infection nor to guide or monitor treatment. Performed at [facility], [address] [place], [place] [address]          Radiology Studies: PERIPHERAL VASCULAR CATHETERIZATION  Result Date: 03/05/2023 See surgical note for result.  VAS [place] LOWER EXTREMITY SAPHENOUS VEIN MAPPING  Result Date: 03/04/2023 LOWER EXTREMITY VEIN MAPPING Lozano Name:  ALAYSA DIGILIO  Date of Exam:   03/04/2023 Medical Rec #: [id number]         Accession #:    [id number] Date of Birth: 28-Dec-1960        Lozano Gender: F Lozano Age:   16 years Exam Location:  [facility] Procedure:      VAS [place] LOWER EXTREMITY SAPHENOUS VEIN MAPPING Referring Phys: Lemar Livings --------------------------------------------------------------------------------  Indications:  Pre-op History:      History of PAD; Lozano is pre-operative for lower extremity               bypass graft. Risk Factors: PAD.  Limitations: Body habitus (BMI 16), diminutive veins Comparison Study: Martha prior study Performing Technologist: Sherren Kerns RVS  Examination Guidelines: A complete evaluation includes B-mode imaging, spectral Doppler, color Doppler, and power Doppler as needed of all accessible portions of each vessel. Bilateral testing is considered an integral part of a complete examination. Limited examinations for reoccurring indications may be performed as noted. +-------------+------------+----------------------+-------------+--------------+  RT Diameter RT Findings           GSV           LT Diameter  LT Findings       (cm)                                           (cm)                    +-------------+------------+----------------------+-------------+--------------+     0.47  Saphenofemoral        0.46                                                    Junction                                   +-------------+------------+----------------------+-------------+--------------+     0.13                     Proximal thigh        0.28       thrombosed   +-------------+------------+----------------------+-------------+--------------+                  not           Mid thigh           0.19       thrombosed                 visualized                                                   +-------------+------------+----------------------+-------------+--------------+                  not          Distal thigh         0.20       thrombosed                 visualized                                                   +-------------+------------+----------------------+-------------+--------------+                  not              Knee                      not visualized               visualized                                                   +-------------+------------+----------------------+-------------+--------------+                  not           Prox calf                    not visualized               visualized                                                   +-------------+------------+----------------------+-------------+--------------+  not            Mid calf                    not visualized               visualized                                                   +-------------+------------+----------------------+-------------+--------------+                  not          Distal calf          0.25                                  visualized                                                    +-------------+------------+----------------------+-------------+--------------+                  not             Ankle                      not visualized               visualized                                                   +-------------+------------+----------------------+-------------+--------------+ Diagnosing physician: Sherald Hess MD Electronically signed by Sherald Hess MD on 03/04/2023 at 6:23:52 PM.    Final         Scheduled Meds:  ascorbic acid  500 mg Oral BID   aspirin EC  81 mg Oral Daily   atorvastatin  40 mg Oral Daily   cholecalciferol  5,000 Units Oral Daily   clopidogrel  75 mg Oral Q0600   docusate sodium  100 mg Oral Daily   feeding supplement (GLUCERNA SHAKE)  237 mL Oral TID BM   gabapentin  100 mg Oral TID   heparin  5,000 Units Subcutaneous Q8H   insulin aspart  0-15 Units Subcutaneous TID WC   insulin aspart  0-5 Units Subcutaneous QHS   insulin glargine-yfgn  20 Units Subcutaneous QHS   leptospermum manuka honey  1 Application Topical Daily   levothyroxine  100 mcg Oral QAC breakfast   linezolid  600 mg Oral Q12H   losartan  25 mg Oral Daily   multivitamin with minerals  1 tablet Oral Daily   pantoprazole  40 mg Oral Daily   sodium chloride flush  3 mL Intravenous Q12H   tamoxifen  20 mg Oral Daily   zinc sulfate  220 mg Oral Daily   Continuous Infusions:  sodium chloride Stopped (03/04/23 0951)   sodium chloride     magnesium sulfate bolus IVPB            Glade Lloyd, MD [facility]  03/06/2023, 1:23 PM

## 2023-03-06 NOTE — Plan of Care (Signed)
  Problem: Education: Goal: Knowledge of General Education information will improve Description: Including pain rating scale, medication(s)/side effects and non-pharmacologic comfort measures Outcome: Progressing   Problem: Health Behavior/Discharge Planning: Goal: Ability to manage health-related needs will improve Outcome: Progressing   Problem: Clinical Measurements: Goal: Ability to maintain clinical measurements within normal limits will improve Outcome: Progressing   Problem: Activity: Goal: Risk for activity intolerance will decrease Outcome: Progressing   Problem: Nutrition: Goal: Adequate nutrition will be maintained Outcome: Progressing   Problem: Coping: Goal: Level of anxiety will decrease Outcome: Progressing   Problem: Pain Managment: Goal: General experience of comfort will improve Outcome: Progressing   Problem: Skin Integrity: Goal: Risk for impaired skin integrity will decrease Outcome: Progressing   Problem: Education: Goal: Understanding of CV disease, CV risk reduction, and recovery process will improve Outcome: Progressing   Problem: Education: Goal: Knowledge of prescribed regimen will improve Outcome: Progressing   Problem: Activity: Goal: Ability to tolerate increased activity will improve Outcome: Progressing   Problem: Clinical Measurements: Goal: Postoperative complications will be avoided or minimized Outcome: Progressing

## 2023-03-06 NOTE — Progress Notes (Addendum)
  Progress Note    03/06/2023 7:39 AM 1 Day Post-Op  Subjective:  still having some soreness in the left groin    Vitals:   03/06/23 0039 03/06/23 0300  BP:  104/74  Pulse: 86 77  Resp: 13 16  Temp:  98.3 F (36.8 C)  SpO2: 99% 99%    Physical Exam: General:  resting comfortably Cardiac:  regular Lungs:  nonlabored Incisions:  L groin incision dry and intact without hematoma. L above knee incision intact and dry Extremities:  LLE with kerlex and ace for compression. Palpable left DP pulse   CBC    Component Value Date/Time   WBC 9.5 03/06/2023 0305   RBC 3.15 (L) 03/06/2023 0305   HGB 9.5 (L) 03/06/2023 0305   HCT 29.0 (L) 03/06/2023 0305   PLT 179 03/06/2023 0305   MCV 92.1 03/06/2023 0305   MCH 30.2 03/06/2023 0305   MCHC 32.8 03/06/2023 0305   RDW 13.5 03/06/2023 0305   LYMPHSABS 3.3 02/26/2023 1110   MONOABS 1.3 (H) 02/26/2023 1110   EOSABS 0.3 02/26/2023 1110   BASOSABS 0.1 02/26/2023 1110    BMET    Component Value Date/Time   NA 133 (L) 03/06/2023 0305   K 3.7 03/06/2023 0305   CL 99 03/06/2023 0305   CO2 26 03/06/2023 0305   GLUCOSE 315 (H) 03/06/2023 0305   BUN 11 03/06/2023 0305   CREATININE 0.71 03/06/2023 0305   CREATININE 0.71 03/09/2014 0916   CALCIUM 7.9 (L) 03/06/2023 0305   GFRNONAA >60 03/06/2023 0305   GFRNONAA >89 03/09/2014 0916   GFRAA >89 03/09/2014 0916    INR No results found for: "INR"   Intake/Output Summary (Last 24 hours) at 03/06/2023 0739 Last data filed at 03/06/2023 0500 Gross per 24 hour  Intake 2502.58 ml  Output 2700 ml  Net -197.42 ml      Assessment/Plan:  62 y.o. female is 1 day post op, s/p:  left iliofemoral endarterectomy, left femoral to AK pop bypass with PTFE   -No issues overnight. Still having soreness in the left groin at the incision site but this is controlled with pain meds -L groin and L AK pop incisions are dry and intact. No hematoma in the left groin -LLE is well perfused with a  palpable DP pulse -Continue ace wrap to the left leg for light compression. Continue floating the heel while in bed -Pending mobilization with PT/OT -Continue asa, plavix, and statin   Loel Dubonnet, PA-C [facility] [phone] 03/06/2023 7:39 AM  VASCULAR STAFF ADDENDUM: I have independently interviewed and examined the patient. I agree with the above.  Palpable DP/PT Groin soft PT OT today. Has outpt follow up with [facility] for left toe wound. No Osteo on MRI, abscess drained. Possible d/c tomorrow pend recs and continued improvement.   Fara Olden, MD [facility] Phone Number: [phone] 03/06/2023 8:58 AM

## 2023-03-06 NOTE — Inpatient Diabetes Management (Signed)
Inpatient Diabetes Program Recommendations  AACE/ADA: New Consensus Statement on Inpatient Glycemic Control (2015)  Target Ranges:  Prepandial:   less than 140 mg/dL      Peak postprandial:   less than 180 mg/dL (1-2 hours)      Critically ill patients:  140 - 180 mg/dL    Latest Reference Range & Units 03/05/23 07:06 03/05/23 10:27 03/05/23 11:48 03/05/23 16:38 03/05/23 21:47  Glucose-Capillary 70 - 99 mg/dL 829 (H) 562 (H) 130 (H) 237 (H) 356 (H)  (H): Data is abnormally high  Latest Reference Range & Units 03/06/23 05:57  Glucose-Capillary 70 - 99 mg/dL 865 (H)  (H): Data is abnormally high    Home DM Meds: Glipizide 5 mg Daily        Metformin 500 mg Daily  Current Orders: Semglee 20 units at bedtime      Novolog Moderate Correction Scale/ SSI (0-15 units) TID AC + HS    MD- Note CBG 278 this AM.  Pt did receive 5 mg Decadron X 1 dose yest at 8am.  Pt has been taught how to use Insulin pen for home in case decision made to d/c home on Insulin given A1c is 13.1%  Please consider for in-hospital regimen:  1. Increase Semglee to 22 units at bedtime  2. Start Novolog Meal Coverage: Novolog 4 units TID with meals HOLD if pt NPO HOLD if pt eats <50% meals    --Will follow patient during hospitalization--  Ambrose Finland RN, MSN, CDCES Diabetes Coordinator Inpatient Glycemic Control Team Team Pager: [phone] (8a-5p)

## 2023-03-06 NOTE — Progress Notes (Signed)
Mobility Specialist Progress Note:   03/06/23 1540  Mobility  Activity Ambulated with assistance in hallway  Level of Assistance Contact guard assist, steadying assist  Assistive Device Front wheel walker  Distance Ambulated (ft) 900 ft  Activity Response Tolerated well  Mobility Referral Yes  $Mobility charge 1 Mobility  Mobility Specialist Start Time (ACUTE ONLY) 1513  Mobility Specialist Stop Time (ACUTE ONLY) 1533  Mobility Specialist Time Calculation (min) (ACUTE ONLY) 20 min    Pre Mobility: 104 HR, 92% SpO2 Post Mobility:  91 HR  Pt received in chair, agreeable to mobility. Asymptomatic throughout w/ no complaints. Requested to use BR upon returning to room. Void successful. Pt left in bed with call bell and all needs met.  [place]ante Earlene Plater Mobility Specialist Please contact via Special educational needs teacher or Rehab office at [phone]

## 2023-03-06 NOTE — Evaluation (Signed)
Physical Therapy Evaluation Patient Details Name: Martha Lozano MRN: [id number] DOB: 1961/04/28 Today's Date: 03/06/2023  History of Present Illness  62 y.o. female admitted here with left foot wound with severely depressed bilateral ABIs and toe pressures of 0. pt now s/p  L femoral [place] popliteal artery bypass. PMH:  breast cancer, diabetes with neuropathy, hepatitis C, substance use   Clinical Impression  Pt admitted with above. Pt eager to mobilize and go home. Pt tolerated AROM to L hip, knee, and ankle well within pain limitation as well as ambulation with RW. Pt with good home set up and support. Pt with need to practice stairs prior to d/c however I don't anticipate her needed follow up PT post d/c. Acute PT to address stair negotiation next session.        If plan is discharge home, recommend the following: A little help with walking and/or transfers;A little help with bathing/dressing/bathroom;Assist for transportation;Help with stairs or ramp for entrance   Can travel by private vehicle        Equipment Recommendations Rolling walker (2 wheels) (youth, pt is 5')  Recommendations for Other Services       Functional Status Assessment Patient has had a recent decline in their functional status and demonstrates the ability to make significant improvements in function in a reasonable and predictable amount of time.     Precautions / Restrictions Precautions Precautions: Fall Restrictions Weight Bearing Restrictions: No      Mobility  Bed Mobility Overal bed mobility: Modified Independent             General bed mobility comments: increased time but able to manage LEs off EOB    Transfers Overall transfer level: Needs assistance Equipment used: Rolling walker (2 wheels) Transfers: Sit to/from Stand Sit to Stand: Contact guard assist           General transfer comment: verbal cues for hand placement, contact guard to steady due to first time up after  surgery, verbal cues to reach back for chair    Ambulation/Gait Ambulation/Gait assistance: Contact guard assist Gait Distance (Feet): 120 Feet Assistive device: Rolling walker (2 wheels) Gait Pattern/deviations: Step-to pattern, Decreased step length - right, Decreased stance time - left, Decreased stride length, Antalgic Gait velocity: dec Gait velocity interpretation: <1.31 ft/sec, indicative of household ambulator   General Gait Details: pt with increased bilat UE WBing when in stance phase on L LE however was able to decrease UE WBing with increased distance. verbal cues to stay in walker  Stairs            Wheelchair Mobility     Tilt Bed    Modified Rankin (Stroke Patients Only)       Balance Overall balance assessment: Mild deficits observed, not formally tested                                           Pertinent Vitals/Pain Pain Assessment Pain Assessment: 0-10 Pain Score: 8  Pain Location: L groin Pain Descriptors / Indicators: Sharp Pain Intervention(s): Patient requesting pain meds-RN notified (RN provided IV morphine post PT eval)    Home Living Family/patient expects to be discharged to:: Private residence Living Arrangements: Spouse/significant other Available Help at Discharge: Family;Available 24 hours/day Type of Home: House Home Access: Stairs to enter Entrance Stairs-Rails: Can reach both Entrance Stairs-Number of Steps: 3   Home Layout:  One level Home Equipment: None      Prior Function Prior Level of Function : Independent/Modified Independent             Mobility Comments: indep, no AD ADLs Comments: indep     Extremity/Trunk Assessment   Upper Extremity Assessment Upper Extremity Assessment: Overall WFL for tasks assessed    Lower Extremity Assessment Lower Extremity Assessment: LLE deficits/detail LLE Deficits / Details: due to surgery limited AROM of hip due to pain, limited WBing    Cervical /  Trunk Assessment Cervical / Trunk Assessment: Normal  Communication   Communication Communication: No apparent difficulties  Cognition Arousal: Alert Behavior During Therapy: WFL for tasks assessed/performed Overall Cognitive Status: Within Functional Limits for tasks assessed                                          General Comments General comments (skin integrity, edema, etc.): VSS, L LE with ace wrap    Exercises General Exercises - Lower Extremity Ankle Circles/Pumps: AROM, Both, 10 reps, Supine Quad Sets: AROM, Left, 10 reps, Seated (with LEs extended) Gluteal Sets: AROM, Both, 5 reps Long Arc Quad: AROM, Both, 10 reps, Seated   Assessment/Plan    PT Assessment Patient needs continued PT services  PT Problem List Decreased strength;Decreased range of motion;Decreased activity tolerance;Decreased balance;Decreased mobility       PT Treatment Interventions DME instruction;Gait training;Stair training;Functional mobility training;Therapeutic activities;Therapeutic exercise;Balance training;Neuromuscular re-education    PT Goals (Current goals can be found in the Care Plan section)  Acute Rehab PT Goals Patient Stated Goal: home ASAP PT Goal Formulation: With patient Time For Goal Achievement: 03/20/23 Potential to Achieve Goals: Good    Frequency Min 1X/week     Co-evaluation               AM-PAC PT "6 Clicks" Mobility  Outcome Measure Help needed turning from your back to your side while in a flat bed without using bedrails?: None Help needed moving from lying on your back to sitting on the side of a flat bed without using bedrails?: None Help needed moving to and from a bed to a chair (including a wheelchair)?: A Little Help needed standing up from a chair using your arms (e.g., wheelchair or bedside chair)?: A Little Help needed to walk in hospital room?: A Little Help needed climbing 3-5 steps with a railing? : A Little 6 Click Score:  20    End of Session   Activity Tolerance: Patient tolerated treatment well Patient left: in chair;with call bell/phone within reach Nurse Communication: Mobility status;Patient requests pain meds PT Visit Diagnosis: Difficulty in walking, not elsewhere classified (R26.2)    Time: 9528-4132 PT Time Calculation (min) (ACUTE ONLY): 21 min   Charges:   PT Evaluation $PT Eval Low Complexity: 1 Low   PT General Charges $$ ACUTE PT VISIT: 1 Visit         Lewis Shock, PT, DPT Acute Rehabilitation Services Secure chat preferred Office #: [phone]   Iona Hansen 03/06/2023, 9:26 AM

## 2023-03-06 NOTE — Evaluation (Signed)
Occupational Therapy Evaluation Patient Details Name: Martha Lozano MRN: [id number] DOB: July 23, 1960 Today's Date: 03/06/2023   History of Present Illness 62 y.o. female admitted here with left foot wound with severely depressed bilateral ABIs and toe pressures of 0. pt now s/p  L femoral [place] popliteal artery bypass. PMH:  breast cancer, diabetes with neuropathy, hepatitis C, substance use   Clinical Impression   Pt s/p above diagnosis. Pt c/o pain, 6/10 L groin at incision, able to fully participate in therapy, not limited by pain. Pt using BSC upon arrival, assisted back to EOB, CGA. Pt lives at home with boyfriend who can assist 24/7 as needed, 1 step to enter home, 1 story house, has BSC. Pt requested RW and SPC. Pt currently requires assistance for LB dressing/bathing for LLE, but displays good ability to complete all other ADLs as needed, CGA for standing/mobility. No OT follow necessary at this time, no other acute OT needs.        If plan is discharge home, recommend the following: A little help with walking and/or transfers;A little help with bathing/dressing/bathroom;Assistance with cooking/housework;Assist for transportation;Help with stairs or ramp for entrance    Functional Status Assessment  Patient has had a recent decline in their functional status and demonstrates the ability to make significant improvements in function in a reasonable and predictable amount of time.  Equipment Recommendations  Other (comment) (RW, Pt requested SPC)    Recommendations for Other Services       Precautions / Restrictions Precautions Precautions: Fall Restrictions Weight Bearing Restrictions: No      Mobility Bed Mobility Overal bed mobility: Modified Independent                  Transfers Overall transfer level: Needs assistance Equipment used: Rolling walker (2 wheels) Transfers: Sit to/from Stand, Bed to chair/wheelchair/BSC Sit to Stand: Contact guard assist      Step pivot transfers: Contact guard assist     General transfer comment: CGA for transfers      Balance Overall balance assessment: Needs assistance Sitting-balance support: Feet supported Sitting balance-Leahy Scale: Good Sitting balance - Comments: EOB ADLs   Standing balance support: During functional activity Standing balance-Leahy Scale: Fair Standing balance comment: able to stand unsupported performing ADLs, uses RW for support with functional activities                           ADL either performed or assessed with clinical judgement   ADL Overall ADL's : Needs assistance/impaired Eating/Feeding: Independent   Grooming: Contact guard assist;Standing   Upper Body Bathing: Set up;Sitting   Lower Body Bathing: Minimal assistance;Sitting/lateral leans   Upper Body Dressing : Set up   Lower Body Dressing: Minimal assistance;Sit to/from stand   Toilet Transfer: Contact guard assist   Toileting- Clothing Manipulation and Hygiene: Contact guard assist       Functional mobility during ADLs: Contact guard assist General ADL Comments: Pt doing well, needs some assistance with LB dressing with LLE     Vision Baseline Vision/History: 1 Wears glasses Ability to See in Adequate Light: 0 Adequate Patient Visual Report: No change from baseline       Perception         Praxis         Pertinent Vitals/Pain Pain Assessment Pain Assessment: 0-10 Pain Score: 6  Pain Location: L groin Pain Descriptors / Indicators: Sharp Pain Intervention(s): Monitored during session     Extremity/Trunk  Assessment Upper Extremity Assessment Upper Extremity Assessment: Overall WFL for tasks assessed   Lower Extremity Assessment Lower Extremity Assessment: Defer to PT evaluation LLE Deficits / Details: due to surgery limited AROM of hip due to pain, limited WBing   Cervical / Trunk Assessment Cervical / Trunk Assessment: Normal   Communication  Communication Communication: No apparent difficulties   Cognition Arousal: Alert Behavior During Therapy: WFL for tasks assessed/performed Overall Cognitive Status: Within Functional Limits for tasks assessed                                       General Comments  VSS, L LE with ace wrap    Exercises     Shoulder Instructions      Home Living Family/patient expects to be discharged to:: Private residence Living Arrangements: Spouse/significant other Available Help at Discharge: Family;Available 24 hours/day Type of Home: House Home Access: Stairs to enter [employer] of Steps: 3 Entrance Stairs-Rails: Can reach both Home Layout: One level     Bathroom Shower/Tub: Chief Strategy Officer: Standard     Home Equipment: BSC/3in1   Additional Comments: Pt lives with boyfriend who can assist 24/7      Prior Functioning/Environment Prior Level of Function : Independent/Modified Independent             Mobility Comments: indep, no AD ADLs Comments: indep        OT Problem List: Decreased range of motion;Decreased activity tolerance;Impaired balance (sitting and/or standing);Pain      OT Treatment/Interventions:      OT Goals(Current goals can be found in the care plan section) Acute Rehab OT Goals Patient Stated Goal: to return home OT Goal Formulation: With patient Time For Goal Achievement: 03/20/23 Potential to Achieve Goals: Good  OT Frequency:      Co-evaluation              AM-PAC OT "6 Clicks" Daily Activity     Outcome Measure Help from another person eating meals?: None Help from another person taking care of personal grooming?: A Little Help from another person toileting, which includes using toliet, bedpan, or urinal?: A Little Help from another person bathing (including washing, rinsing, drying)?: A Little Help from another person to put on and taking off regular upper body clothing?: A Little Help  from another person to put on and taking off regular lower body clothing?: A Little 6 Click Score: 19   End of Session Equipment Utilized During Treatment: Rolling walker (2 wheels);Gait belt Nurse Communication: Mobility status  Activity Tolerance: Patient tolerated treatment well Patient left: in bed;with call bell/phone within reach  OT Visit Diagnosis: Other abnormalities of gait and mobility (R26.89);Muscle weakness (generalized) (M62.81);Pain Pain - Right/Left: Left Pain - part of body: Leg                Time: 1610-9604 OT Time Calculation (min): 19 min Charges:  OT General Charges $OT Visit: 1 Visit OT Evaluation $OT Eval Low Complexity: 1 Low  [address] OTR/L   Alexis Goodell 03/06/2023, 10:20 AM

## 2023-03-07 ENCOUNTER — Ambulatory Visit: Payer: Medicaid Other | Admitting: Podiatry

## 2023-03-07 ENCOUNTER — Telehealth: Payer: Self-pay | Admitting: Vascular Surgery

## 2023-03-07 DIAGNOSIS — L03116 Cellulitis of left lower limb: Secondary | ICD-10-CM | POA: Diagnosis not present

## 2023-03-07 LAB — GLUCOSE, CAPILLARY: Glucose-Capillary: 176 mg/dL — ABNORMAL HIGH (ref 70–99)

## 2023-03-07 MED ORDER — ASPIRIN 81 MG PO TBEC: 81.0000 mg | DELAYED_RELEASE_TABLET | Freq: Every day | ORAL | 0 refills | Status: AC

## 2023-03-07 MED ORDER — ATORVASTATIN CALCIUM 40 MG PO TABS: 40.0000 mg | ORAL_TABLET | Freq: Every day | ORAL | 0 refills | Status: DC

## 2023-03-07 MED ORDER — LOSARTAN POTASSIUM 50 MG PO TABS: 25.0000 mg | ORAL_TABLET | Freq: Every day | ORAL | Status: DC

## 2023-03-07 MED ORDER — HYDROCODONE-ACETAMINOPHEN 5-325 MG PO TABS: 1.0000 | ORAL_TABLET | Freq: Four times a day (QID) | ORAL | 0 refills | Status: DC | PRN

## 2023-03-07 MED ORDER — METFORMIN HCL 500 MG PO TABS
500.0000 mg | ORAL_TABLET | Freq: Two times a day (BID) | ORAL | 0 refills | Status: DC
Start: 2023-03-07 — End: 2023-10-29

## 2023-03-07 MED ORDER — CLOPIDOGREL BISULFATE 75 MG PO TABS: 75.0000 mg | ORAL_TABLET | Freq: Every day | ORAL | 0 refills | Status: DC

## 2023-03-07 NOTE — Progress Notes (Signed)
Mobility Specialist Progress Note:    03/07/23 1100  Mobility  Activity Refused mobility  Mobility Specialist Start Time (ACUTE ONLY) 1101   Pt refused mobility d/t fatigue from previous PT session. Will f/u as able.    [place] Earlene Plater Mobility Specialist Please contact via Special educational needs teacher or Rehab office at [phone]

## 2023-03-07 NOTE — TOC Transition Note (Signed)
Transition of Care (TOC) - CM/SW Discharge Note Donn Pierini RN, BSN Transitions of Care Unit 4E- RN Case Manager See Treatment Team for direct phone #  Patient Details  Name: Martha Lozano MRN: [id number] Date of Birth: 11-02-1960  Transition of Care [facility]) CM/SW Contact:  Darrold Span, RN Phone Number: 03/07/2023, 11:41 AM   Clinical Narrative:    Pt stable for transition home today, DME order placed for RW- no agency preference stated, will use in house provider- call made to [employer] for DME need- RW to be delivered to room prior to discharge.  Pt has transportation home once DME arrives.  Noted pt does not have PCP listed- info provided on [employer] and Medicaid website for finding a PCP- pt to follow up.    Final next level of care: Home/Self Care Barriers to Discharge: Barriers Resolved   Patient Goals and CMS Choice   Choice offered to / list presented to : Patient  Discharge Placement                 Home        Discharge Plan and Services Additional resources added to the After Visit Summary for     Discharge Planning Services: CM Consult Post Acute Care Choice: Durable Medical Equipment          DME Arranged: Dan Humphreys rolling DME Agency: [employer] Date DME Agency Contacted: 03/07/23 Time DME Agency Contacted: [id number] Representative spoke with at DME Agency: Zack HH Arranged: NA HH Agency: NA        Social Determinants of Health (SDOH) Interventions SDOH Screenings   Food Insecurity: No Food Insecurity (02/26/2023)  Housing: Low Risk  (02/26/2023)  Transportation Needs: No Transportation Needs (02/26/2023)  Utilities: Not At Risk (02/26/2023)  Physical Activity: Inactive (07/04/2021)   Received from [facility], [facility]  Stress: No Stress Concern Present (07/04/2021)   Received from [facility], [facility]  Tobacco Use: High Risk (03/05/2023)  Health Literacy: Low Risk  (07/04/2021)   Received from [facility]     Readmission Risk Interventions    03/07/2023   11:41 AM 02/28/2023    3:07 PM  Readmission Risk Prevention Plan  Transportation Screening Complete Complete  PCP or Specialist Appt within 3-5 Days  Complete  HRI or Home Care Consult  Complete  Social Work Consult for Recovery Care Planning/Counseling  Complete  Palliative Care Screening  Complete  Medication Review [employer]) Complete Complete  HRI or Home Care Consult Complete   SW Recovery Care/Counseling Consult Complete   Palliative Care Screening Not Applicable   Skilled Nursing Facility Not Applicable

## 2023-03-07 NOTE — Telephone Encounter (Signed)
-----   Message from [facility] sent at 03/07/2023  7:58 AM EDT ----- S/p L femoral to AK popliteal bypass 8/20   Please arrange follow up in 2-3 wks for incision check, with PA or MD, on [place] office day

## 2023-03-07 NOTE — Discharge Summary (Signed)
Physician Discharge Summary  Airabella Schaadt MVH:[id number] DOB: [birth year] DOA: 02/25/2023  PCP: Patient, [name] Per  Admit date: 02/25/2023 Discharge date: 03/07/2023  Admitted From: Home Disposition: Home  Recommendations for Outpatient Follow-up:  Follow up with PCP in 1 week  Outpatient follow-up with vascular surgery.  Discharge wound care/pain management as per vascular surgery recommendations Follow up in ED if symptoms worsen or new appear   Home Health: No Equipment/Devices: None  Discharge Condition: Stable CODE STATUS: Full Diet recommendation: Heart healthy  Brief/Interim Summary: 62 year old female with breast cancer, type II DM, neuropathy, hep C, substance abuse presented with left foot pain.  She was found to have bilateral PAD with nonhealing left foot ulcer with left leg cellulitis with small abscess without any evidence of osteomyelitis or septic arthritis.  She underwent left lower extremity angiogram on 03/04/2023 by vascular surgery: Was found to have extensive disease.  She underwent left iliofemoral endarterectomy and femoral to above-knee popliteal artery bypass by vascular surgery on 03/05/2023.  Subsequently, she has tolerated PT evaluation well.  Vascular surgery has cleared the patient for discharge.  Discharge patient home today.  Outpatient follow-up with vascular surgery.  Discharge Diagnoses:  Left lower extremity cellulitis with nonhealing ulcer (base of left great toe and left heel) in the setting of uncontrolled diabetes mellitus and severe PAD Severe PAD -MRI negative for any osteomyelitis. -underwent left lower extremity angiogram on 03/04/2023 by vascular surgery: Was found to have extensive disease.  She underwent left iliofemoral endarterectomy and femoral to above-knee popliteal artery bypass by vascular surgery on 03/05/2023.   -Initially on vancomycin: Subsequently switched to Zyvox.  Currently on Zyvox.  Blood cultures negative so far.  No need  for any more antibiotics on discharge. -Continue aspirin and Plavix -she has tolerated PT evaluation well.   -Vascular surgery has cleared the patient for discharge.  Discharge patient home today.  Outpatient follow-up with vascular surgery.   Diabetes mellitus type 2 with hyperglycemia -Outpatient follow-up with PCP regarding need for insulin (patient is a noncompliant patient with substance abuse and I would wait for her to see her PCP before starting insulin).  Continue glipizide.  Increase metformin to twice a day.  Carb modified diet.   Chronic pain syndrome -Patient needs to be see pain clinic for Suboxone therapy: Has not seen them since May 2024.  Outpatient follow-up. -Discharge pain medications as per vascular surgery   Hypothyroidism -Continue Synthroid   Essential hypertension -Blood pressure intermittently on the lower side.  Continue lower-dose of losartan   Hyperlipidemia -Continue statin   Mood disorder -Effexor can be resumed on discharge.   Substance abuse -UDS positive for opiates and cocaine.  Patient needs to abstain from illicit drug use.  Avoid beta-blockers.   Severe protein calorie malnutrition -Follow nutrition recommendations   Nicotine dependence -Counseled regarding smoking cessation by prior hospitalist   Discharge Instructions  Discharge Instructions     Diet - low sodium heart healthy   Complete by: As directed    Diet Carb Modified   Complete by: As directed    Discharge wound care:   Complete by: As directed    As per vascular surgery   Increase activity slowly   Complete by: As directed       Allergies as of 03/07/2023   No Known Allergies      Medication List     STOP taking these medications    doxycycline 100 MG capsule Commonly known as: VIBRAMYCIN   meloxicam  15 MG tablet Commonly known as: MOBIC   metoprolol tartrate 50 MG tablet Commonly known as: LOPRESSOR       TAKE these medications    aspirin EC 81  MG tablet Take 1 tablet (81 mg total) by mouth daily. Swallow whole. Start taking on: March 08, 2023   atorvastatin 40 MG tablet Commonly known as: LIPITOR Take 1 tablet (40 mg total) by mouth daily. Start taking on: March 08, 2023 What changed:  medication strength how much to take   clopidogrel 75 MG tablet Commonly known as: PLAVIX Take 1 tablet (75 mg total) by mouth daily at 6 (six) AM. Start taking on: March 08, 2023   gabapentin 100 MG capsule Commonly known as: NEURONTIN Take 100 mg by mouth 3 (three) times daily.   glipiZIDE 5 MG 24 hr tablet Commonly known as: GLUCOTROL XL Take 1 tablet (5 mg total) by mouth daily with breakfast.   HYDROcodone-acetaminophen 5-325 MG tablet Commonly known as: NORCO/VICODIN Take 1 tablet by mouth every 6 (six) hours as needed for moderate pain.   levothyroxine 100 MCG tablet Commonly known as: SYNTHROID Take 1 tablet (100 mcg total) by mouth daily before breakfast.   losartan 50 MG tablet Commonly known as: COZAAR Take 0.5 tablets (25 mg total) by mouth daily. What changed: how much to take   metFORMIN 500 MG tablet Commonly known as: GLUCOPHAGE Take 1 tablet (500 mg total) by mouth 2 (two) times daily with a meal. What changed: when to take this   naloxone 4 MG/0.1ML Liqd nasal spray kit Commonly known as: NARCAN SMARTSIG:Both Nares   Suboxone 12-3 MG Film Generic drug: Buprenorphine HCl-Naloxone HCl Place 1 Film under the tongue 2 (two) times daily.   venlafaxine XR 37.5 MG 24 hr capsule Commonly known as: EFFEXOR-XR Take by mouth.               Durable Medical Equipment  (From admission, onward)           Start     Ordered   03/07/23 0937  For home use only DME Walker rolling  Once       Comments: Youth sized  Question Answer Comment  Walker: With 5 Inch Wheels   Patient needs a walker to treat with the following condition Physical deconditioning      03/07/23 0936               Discharge Care Instructions  (From admission, onward)           Start     Ordered   03/07/23 0000  Discharge wound care:       Comments: As per vascular surgery   03/07/23 0915            Follow-up Information      [facility] Follow up in 3 week(s).   Specialty: Vascular Surgery Why: Office will call to arrange your appt(s) (sent) Contact information: [address] [place] [address] [phone]        PCP. Schedule an appointment as soon as possible for a visit in 1 week(s).                 No Known Allergies  Consultations: Vascular surgery   Procedures/Studies: PERIPHERAL VASCULAR CATHETERIZATION  Result Date: 03/05/2023 See surgical note for result.  VAS [place] LOWER EXTREMITY SAPHENOUS VEIN MAPPING  Result Date: 03/04/2023 LOWER EXTREMITY VEIN MAPPING Patient Name:  IVELISSE BOGIN  Date of  Exam:   03/04/2023 Medical Rec #: [id number]         Accession #:    [id number] Date of Birth: 1961-07-01        Patient Gender: F Patient Age:   57 years Exam Location[facility]  [facility] Procedure:      VAS [place] LOWER EXTREMITY SAPHENOUS VEIN MAPPING Referring Phys: Lemar Livings --------------------------------------------------------------------------------  Indications:  Pre-op History:      History of PAD; patient is pre-operative for lower extremity               bypass graft. Risk Factors: PAD.  Limitations: Body habitus (BMI 16), diminutive veins Comparison Study: No prior study Performing Technologist: Sherren Kerns RVS  Examination Guidelines: A complete evaluation includes B-mode imaging, spectral Doppler, color Doppler, and power Doppler as needed of all accessible portions of each vessel. Bilateral testing is considered an integral part of a complete examination. Limited examinations for reoccurring indications may be performed as noted.  +-------------+------------+----------------------+-------------+--------------+  RT Diameter RT Findings          GSV           LT Diameter  LT Findings       (cm)                                           (cm)                    +-------------+------------+----------------------+-------------+--------------+     0.47                     Saphenofemoral        0.46                                                    Junction                                   +-------------+------------+----------------------+-------------+--------------+     0.13                     Proximal thigh        0.28       thrombosed   +-------------+------------+----------------------+-------------+--------------+                  not           Mid thigh           0.19       thrombosed                 visualized                                                   +-------------+------------+----------------------+-------------+--------------+                  not          Distal thigh         0.20       thrombosed  visualized                                                   +-------------+------------+----------------------+-------------+--------------+                  not              Knee                      not visualized               visualized                                                   +-------------+------------+----------------------+-------------+--------------+                  not           Prox calf                    not visualized               visualized                                                   +-------------+------------+----------------------+-------------+--------------+                  not            Mid calf                    not visualized               visualized                                                   +-------------+------------+----------------------+-------------+--------------+                   not          Distal calf          0.25                                  visualized                                                   +-------------+------------+----------------------+-------------+--------------+                  not             Ankle                      not visualized  visualized                                                   +-------------+------------+----------------------+-------------+--------------+ Diagnosing physician: Sherald Hess MD Electronically signed by Sherald Hess MD on 03/04/2023 at 6:23:52 PM.    Final    PERIPHERAL VASCULAR CATHETERIZATION  Result Date: 03/04/2023 Images from the original result were not included. 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 Randie Heinz, MD Procedure Performed: 1.  Ultrasound-guided cannulation right common femoral artery 2.  Catheter aorta and aortogram 3.  Catheter left common femoral artery left lower extremity angiogram 4.  Right lower extremity angiogram 5.  Moderate sedation with fentanyl and Versed for 18 minutes Indications: 62 year old female with known peripheral arterial disease left first metatarsal wound with severely depressed toe pressures bilaterally indicated for angiography with possible invention. Findings: Bilateral common femoral arteries are calcified.  The aorta and iliac segments are diminutive although free of flow-limiting stenosis throughout the bilateral common and external iliac arteries and the bilateral hypogastric arteries are patent.  SFAs are flush occluded bilaterally and reconstitutes above the knee popliteal arteries which are diminutive but three-vessel runoff to the foot. Plan will be for left common femoral to above-knee versus below-knee popliteal artery bypass  during this hospitalization.  Procedure:  The patient was identified in the holding area and taken to room 8.  The patient was then placed supine on the table and prepped and draped in the usual sterile fashion.  A time out was called.  Ultrasound was used to evaluate the right common femoral artery which was noted be patent although heavily calcified.  The area was anesthetized and cannulated with a micropuncture wire and a micropuncture sheath.  An ultrasound image was saved to the permanent record.  Concomitantly we administered fentanyl and Versed moderate sedation and her vital signs were monitored throughout the case by bedside nursing.  We placed a Bentson wire followed by 5 French sheath and Omni catheter to the level of L1 and aortogram was performed.  We crossed the bifurcation with Omni catheter and Bentson wire perform left lower extremity angiography.  With the above findings she will need surgical intervention.  The catheter was removed over the wire and right lower extremity angiography was performed through the sheath.  The sheath will be pulled in postoperative holding.  The patient tolerated the procedure without any complication. Contrast: 40 cc Brandon C. Randie Heinz, MD [facility] [place] Office: [phone] Pager: [phone]   VAS [place] ABI WITH/WO TBI  Result Date: 02/28/2023  LOWER EXTREMITY DOPPLER STUDY Patient Name:  Kyara Bartman  Date of Exam:   02/27/2023 Medical Rec #: [id number]         Accession #:    [id number] Date of Birth: 1961/01/11        Patient Gender: F Patient Age:   32 years Exam Location:  [facility] Procedure:      VAS [place] ABI WITH/WO TBI Referring Phys: PRANAV PATEL --------------------------------------------------------------------------------  Indications: Ulceration of left foot High Risk Factors: Diabetes, current smoker, polysubstance abuse.  Limitations: Today's exam was limited due to continuous patient movement. Comparison Study:  No previous exams Performing Technologist: Ernestene Mention RVT, RDMS  Examination  Guidelines: A complete evaluation includes at minimum, Doppler waveform signals and systolic blood pressure reading at the level of bilateral brachial, anterior tibial, and posterior tibial arteries, when vessel segments are accessible. Bilateral testing is considered an integral part of a complete examination. Photoelectric Plethysmograph (PPG) waveforms and toe systolic pressure readings are included as required and additional duplex testing as needed. Limited examinations for reoccurring indications may be performed as noted.  ABI Findings: +---------+------------------+-----+----------+--------+ Right    Rt Pressure (mmHg)IndexWaveform  Comment  +---------+------------------+-----+----------+--------+ Brachial 104                    triphasic          +---------+------------------+-----+----------+--------+ PTA      37                0.36 monophasic         +---------+------------------+-----+----------+--------+ DP       39                0.38 monophasic         +---------+------------------+-----+----------+--------+ Great Toe0                 0.00 Absent             +---------+------------------+-----+----------+--------+ +---------+------------------+-----+----------+-------+ Left     Lt Pressure (mmHg)IndexWaveform  Comment +---------+------------------+-----+----------+-------+ Brachial 93                     triphasic         +---------+------------------+-----+----------+-------+ PTA      50                0.48 monophasic        +---------+------------------+-----+----------+-------+ DP       48                0.46 monophasic        +---------+------------------+-----+----------+-------+ Great Toe0                 0.00 Absent            +---------+------------------+-----+----------+-------+ +-------+-----------+-----------+------------+------------+ ABI/TBIToday's  ABIToday's TBIPrevious ABIPrevious TBI +-------+-----------+-----------+------------+------------+ Right  0.38       absent                              +-------+-----------+-----------+------------+------------+ Left   0.48       absent                              +-------+-----------+-----------+------------+------------+  Summary: Right: Resting right ankle-brachial index indicates severe right lower extremity arterial disease. The right toe-brachial index is absent. Left: Resting left ankle-brachial index indicates severe left lower extremity arterial disease. The left toe-brachial index is absent. *See table(s) above for measurements and observations.  Suggest Peripheral Vascular Consult. Electronically signed by Sherald Hess MD on 02/28/2023 at 9:45:32 AM.    Final    MR FOOT LEFT WO CONTRAST  Result Date: 02/26/2023 CLINICAL DATA:  Osteonecrosis suspected, foot, x-ray done. Previous studies performed for great toe erythema and possible osteomyelitis. EXAM: MRI OF THE LEFT FOOT WITHOUT CONTRAST TECHNIQUE: Multiplanar, multisequence MR imaging of the left forefoot was performed. No intravenous contrast was administered. COMPARISON:  CT and radiographs of the left foot 02/25/2023. No other comparison imaging. FINDINGS: Bones/Joint/Cartilage There is osteolysis and sclerosis of the tip of the distal 1st phalanx with associated low  T1 marrow signal. No significant marrow T2 hyperintensity is identified. The proximal phalanx appears normal. There are minimal degenerative changes at the 1st metatarsophalangeal joint. No significant joint effusions. The alignment at the Lisfranc joint is normal. Ligaments Intact Lisfranc ligament. Intact collateral ligaments of the metatarsophalangeal joints. Muscles and Tendons Mild nonspecific generalized muscular edema. No evidence of forefoot tendon tear or suspicious tenosynovitis. A small amount of fluid within the flexor hallucis longus tendon sheath  in the midfoot typically relates to communication with the ankle joint. Soft tissues As seen on earlier CT, there is an ill-defined subcutaneous collection medial to the proximal phalanx of the great toe which measures approximately 1.5 x 0.9 x 1.3 cm. In this general facility, there is focal skin ulceration along the plantar aspect of the proximal phalanx of the great toe, best seen on the short axis axial images. No other focal fluid collections are identified. There is generalized soft tissue swelling in the great toe as well as nonspecific edema throughout the dorsal subcutaneous tissues. IMPRESSION: 1. Focal skin ulceration along the plantar aspect of the proximal phalanx of the great toe with nearby ill-defined subcutaneous collection medial to the proximal phalanx of the great toe suspicious for a small abscess. 2. No definitive signs of osteomyelitis or septic arthritis. Nonspecific acro-osteolysis of the distal 1st phalanx which has a broad differential, including connective tissue, vascular, thermal and arthropathic etiologies. Correlate clinically. 3. No evidence of forefoot tendon tear or suspicious tenosynovitis. Electronically Signed   By: Carey Bullocks M.D.   On: 02/26/2023 10:09   CT FOOT LEFT W CONTRAST  Result Date: 02/26/2023 CLINICAL DATA:  Redness to great toe, large wound to posterior heel, evaluate for osteomyelitis EXAM: CT OF THE LOWER LEFT EXTREMITY WITH CONTRAST TECHNIQUE: Multidetector CT imaging of the lower left extremity was performed according to the standard protocol following intravenous contrast administration. RADIATION DOSE REDUCTION: This exam was performed according to the departmental dose-optimization program which includes automated exposure control, adjustment of the mA and/or kV according to patient size and/or use of iterative reconstruction technique. CONTRAST:  75mL OMNIPAQUE IOHEXOL 300 MG/ML  SOLN COMPARISON:  Left foot radiographs dated 02/25/2023 FINDINGS:  Subcutaneous fluid collection measuring approximately 16 x 13 x 9 mm (series 8/image 54; series 10/image 23), along the medial aspect of the 1st proximal phalanx, suspicious for tiny subcutaneous boil/abscess. No adjacent cortical destruction to suggest osteomyelitis. Mild soft tissue irregularity along the posterior aspect of the calcaneus (series 8/image 60), likely corresponding to the patient's known soft tissue wound. No adjacent cortical destruction to suggest osteomyelitis. No fracture or dislocation is seen. The joint spaces are preserved. IMPRESSION: No evidence of osteomyelitis. Specifically, the 1st digit and posterior calcaneus remain intact. 16 mm subcutaneous fluid collection along the medial aspect of the 1st proximal phalanx, suspicious for tiny subcutaneous boil/abscess. Electronically Signed   By: Charline Bills M.D.   On: 02/26/2023 00:01   DG Foot Complete Left  Result Date: 02/25/2023 CLINICAL DATA:  Infection. EXAM: LEFT FOOT - COMPLETE 3+ VIEW COMPARISON:  None Available. FINDINGS: Minimal irregularity of the tip of the distal phalanx of the great toe may represent osteomyelitis. Clinical correlation is recommended. There is no acute fracture or dislocation. No significant arthritic changes. There is soft tissue swelling of the forefoot and great toe. No radiopaque foreign object or soft tissue gas. IMPRESSION: 1. No acute fracture or dislocation. 2. Possible osteomyelitis of the distal phalanx of the great toe. Clinical correlation is recommended. Electronically Signed  By: Elgie Collard M.D.   On: 02/25/2023 21:35   DG Chest 2 View  Result Date: 02/25/2023 CLINICAL DATA:  Chest pain.  Chest EXAM: CHEST - 2 VIEW COMPARISON:  [place] dated 12/23/2012. FINDINGS: The lungs are clear. There is no pleural effusion or pneumothorax. The cardiac silhouette is within normal limits. No acute osseous pathology. IMPRESSION: No active cardiopulmonary disease. Electronically Signed   By:  Elgie Collard M.D.   On: 02/25/2023 16:42      Subjective: Patient seen and examined at bedside.  Feels okay to go home today.  No fever, chest pain or worsening shortness of breath reported.  Discharge Exam: Vitals:   03/07/23 0422 03/07/23 0826  BP: (!) 105/58 99/60  Pulse: 88 100  Resp: 14 19  Temp: 98.3 F (36.8 C) 98.4 F (36.9 C)  SpO2: 100% 100%    General: Pt is alert, awake, not in acute distress.  Looks chronically ill and deconditioned.  On room air. Cardiovascular: rate controlled, S1/S2 + Respiratory: bilateral decreased breath sounds at bases Abdominal: Soft, NT, ND, bowel sounds + Extremities: Left lower extremity dressing present.   The results of significant diagnostics from this hospitalization (including imaging, microbiology, ancillary and laboratory) are listed below for reference.     Microbiology: Recent Results (from the past 240 hour(s))  Culture, blood (Routine X 2) w Reflex to ID Panel     Status: None   Collection Time: 02/26/23 11:05 AM   Specimen: Right Antecubital; Blood  Result Value Ref Range Status   Specimen Description   Final    RIGHT ANTECUBITAL Performed at [facility], [address] [place], [place] [address]    Special Requests   Final    BOTTLES DRAWN AEROBIC AND ANAEROBIC Blood Culture adequate volume Performed at [facility], [address], [place], [place] [address]    Culture   Final    NO GROWTH 5 DAYS Performed at [facility], [address] [place], [place] [address]    Report Status 03/03/2023 FINAL  Final  Culture, blood (Routine X 2) w Reflex to ID Panel     Status: None   Collection Time: 02/26/23 11:10 AM   Specimen: BLOOD LEFT ARM  Result Value Ref Range Status   Specimen Description   Final    BLOOD LEFT ARM Performed at [facility], [address] [place], [place] [address]    Special Requests   Final    BOTTLES DRAWN AEROBIC AND  ANAEROBIC Blood Culture adequate volume Performed at [facility], [address], [place], [place] [address]    Culture   Final    NO GROWTH 5 DAYS Performed at [facility], [address], [place], [place] [address]    Report Status 03/03/2023 FINAL  Final  Surgical pcr screen     Status: None   Collection Time: 03/05/23  6:00 AM   Specimen: Nasal Mucosa; Nasal Swab  Result Value Ref Range Status   MRSA, PCR NEGATIVE NEGATIVE Final   Staphylococcus aureus NEGATIVE NEGATIVE Final    Comment: (NOTE) The Xpert SA Assay (FDA approved for NASAL specimens in patients 62 years of age and older), is one component of a comprehensive surveillance program. It is not intended to diagnose infection nor to guide or monitor treatment. Performed at [facility], [address], [place], [place] [address]      Labs: BNP (last 3 results)  No results for input(s): "BNP" in the last 8760 hours. Basic Metabolic Panel: Recent Labs  Lab 03/01/23 0941 03/03/23 1133 03/04/23 0358 03/05/23 0914 03/06/23 0305  NA 141 137 134* 139 133*  K 3.5 3.8 3.7 3.8 3.7  CL 100 99 99  --  99  CO2 28 28 28   --  26  GLUCOSE 332* 352* 483*  --  315*  BUN 8 9 13   --  11  CREATININE 0.92 0.79 0.84  --  0.71  CALCIUM 8.6* 8.5* 8.2*  --  7.9*   Liver Function Tests: No results for input(s): "AST", "ALT", "ALKPHOS", "BILITOT", "PROT", "ALBUMIN" in the last 168 hours. No results for input(s): "LIPASE", "AMYLASE" in the last 168 hours. No results for input(s): "AMMONIA" in the last 168 hours. CBC: Recent Labs  Lab 03/01/23 0941 03/03/23 1133 03/04/23 0358 03/05/23 0914 03/06/23 0305  WBC 6.2 6.6 7.5  --  9.5  HGB 11.7* 11.2* 11.3* 11.9* 9.5*  HCT 36.6 34.7* 35.0* 35.0* 29.0*  MCV 90.4 92.0 91.1  --  92.1  PLT 201 210 205  --  179   Cardiac Enzymes: No results for input(s): "CKTOTAL", "CKMB", "CKMBINDEX", "TROPONINI" in the last 168 hours. BNP: Invalid input(s):  "POCBNP" CBG: Recent Labs  Lab 03/06/23 0557 03/06/23 1117 03/06/23 1623 03/06/23 2111 03/07/23 0610  GLUCAP 278* 342* 217* 209* 176*   D-Dimer No results for input(s): "DDIMER" in the last 72 hours. Hgb A1c No results for input(s): "HGBA1C" in the last 72 hours. Lipid Profile No results for input(s): "CHOL", "HDL", "LDLCALC", "TRIG", "CHOLHDL", "LDLDIRECT" in the last 72 hours. Thyroid function studies No results for input(s): "TSH", "T4TOTAL", "T3FREE", "THYROIDAB" in the last 72 hours.  Invalid input(s): "FREET3" Anemia work up No results for input(s): "VITAMINB12", "FOLATE", "FERRITIN", "TIBC", "IRON", "RETICCTPCT" in the last 72 hours. Urinalysis    Component Value Date/Time   COLORURINE YELLOW 02/25/2023 1618   APPEARANCEUR HAZY (A) 02/25/2023 1618   LABSPEC 1.027 02/25/2023 1618   PHURINE 5.0 02/25/2023 1618   GLUCOSEU >=500 (A) 02/25/2023 1618   HGBUR MODERATE (A) 02/25/2023 1618   BILIRUBINUR NEGATIVE 02/25/2023 1618   KETONESUR NEGATIVE 02/25/2023 1618   PROTEINUR 30 (A) 02/25/2023 1618   UROBILINOGEN 2.0 (H) 04/11/2011 2014   NITRITE NEGATIVE 02/25/2023 1618   LEUKOCYTESUR TRACE (A) 02/25/2023 1618   Sepsis Labs Recent Labs  Lab 03/01/23 0941 03/03/23 1133 03/04/23 0358 03/06/23 0305  WBC 6.2 6.6 7.5 9.5   Microbiology Recent Results (from the past 240 hour(s))  Culture, blood (Routine X 2) w Reflex to ID Panel     Status: None   Collection Time: 02/26/23 11:05 AM   Specimen: Right Antecubital; Blood  Result Value Ref Range Status   Specimen Description   Final    RIGHT ANTECUBITAL Performed at [facility], [address], [place], [place] [address]    Special Requests   Final    BOTTLES DRAWN AEROBIC AND ANAEROBIC Blood Culture adequate volume Performed at [facility], [address] [place], [place] [address]    Culture   Final    NO GROWTH 5 DAYS Performed at [facility], [address], [place], [place] [address]    Report Status 03/03/2023 FINAL  Final  Culture, blood (Routine X 2) w Reflex to ID Panel     Status: None   Collection Time: 02/26/23 11:10 AM   Specimen: BLOOD LEFT ARM  Result Value Ref Range Status  Specimen Description   Final    BLOOD LEFT ARM Performed at [facility], [address], [place], [place] [address]    Special Requests   Final    BOTTLES DRAWN AEROBIC AND ANAEROBIC Blood Culture adequate volume Performed at [facility], [address], [place], [place] [address]    Culture   Final    NO GROWTH 5 DAYS Performed at [facility], [address] [place], [place] [address]    Report Status 03/03/2023 FINAL  Final  Surgical pcr screen     Status: None   Collection Time: 03/05/23  6:00 AM   Specimen: Nasal Mucosa; Nasal Swab  Result Value Ref Range Status   MRSA, PCR NEGATIVE NEGATIVE Final   Staphylococcus aureus NEGATIVE NEGATIVE Final    Comment: (NOTE) The Xpert SA Assay (FDA approved for NASAL specimens in patients 26 years of age and older), is one component of a comprehensive surveillance program. It is not intended to diagnose infection nor to guide or monitor treatment. Performed at [facility], [address] [place], [place] [address]      Time coordinating discharge: 35 minutes  SIGNED:   Glade Lloyd, MD  [facility] 03/07/2023, 10:31 AM

## 2023-03-07 NOTE — Progress Notes (Signed)
Physical Therapy Treatment Patient Details Name: Martha Lozano MRN: [id number] DOB: 03-05-1961 Today's Date: 03/07/2023   History of Present Illness 62 y.o. female admitted here with left foot wound with severely depressed bilateral ABIs and toe pressures of 0. pt now s/p  L femoral AK popliteal artery bypass. PMH:  breast cancer, diabetes with neuropathy, hepatitis C, substance use    PT Comments  Pt received in supine and agreeable to session. Pt able to tolerate increased gait distance and stair trial this session, however remains limited by LLE pain. Pt requiring up to CGA for safety and multiple standing rest breaks, but no overt instability noted with RW support. Pt able to increase LLE WB with progressed distance.    If plan is discharge home, recommend the following: A little help with walking and/or transfers;A little help with bathing/dressing/bathroom;Assist for transportation;Help with stairs or ramp for entrance   Can travel by private vehicle        Equipment Recommendations  Rolling walker (2 wheels) (youth)    Recommendations for Other Services       Precautions / Restrictions Precautions Precautions: Fall Restrictions Weight Bearing Restrictions: No     Mobility  Bed Mobility Overal bed mobility: Modified Independent                  Transfers Overall transfer level: Needs assistance Equipment used: Rolling walker (2 wheels) Transfers: Sit to/from Stand Sit to Stand: Supervision           General transfer comment: From EOB with supervision for safety    Ambulation/Gait Ambulation/Gait assistance: Supervision Gait Distance (Feet): 220 Feet Assistive device: Rolling walker (2 wheels) Gait Pattern/deviations: Step-through pattern, Decreased stride length, Antalgic Gait velocity: dec     General Gait Details: Pt with slow step-through pattern with limited LLE WB that improved with progressed distance   Stairs Stairs: Yes Stairs  assistance: Contact guard assist Stair Management: One rail Left Number of Stairs: 3 General stair comments: Pt able to perform with L rail and R HHA. Cues for sequencing       Balance Overall balance assessment: Needs assistance Sitting-balance support: Feet supported Sitting balance-Leahy Scale: Good Sitting balance - Comments: sitting EOB   Standing balance support: Bilateral upper extremity supported, During functional activity, Reliant on assistive device for balance Standing balance-Leahy Scale: Fair Standing balance comment: Reliant on RW to offload LLE during ambulation, but able to static stand without AD                            Cognition Arousal: Alert Behavior During Therapy: WFL for tasks assessed/performed Overall Cognitive Status: Within Functional Limits for tasks assessed                                          Exercises      General Comments General comments (skin integrity, edema, etc.): VSS      Pertinent Vitals/Pain Pain Assessment Pain Assessment: 0-10 Pain Score: 5  Pain Location: L groin Pain Descriptors / Indicators: Sharp Pain Intervention(s): Limited activity within patient's tolerance, Monitored during session, Repositioned     PT Goals (current goals can now be found in the care plan section) Acute Rehab PT Goals Patient Stated Goal: home ASAP PT Goal Formulation: With patient Time For Goal Achievement: 03/20/23 Potential to Achieve Goals: Good Progress towards  PT goals: Progressing toward goals    Frequency    Min 1X/week      PT Plan         AM-PAC PT "6 Clicks" Mobility   Outcome Measure  Help needed turning from your back to your side while in a flat bed without using bedrails?: None Help needed moving from lying on your back to sitting on the side of a flat bed without using bedrails?: None Help needed moving to and from a bed to a chair (including a wheelchair)?: A Little Help needed  standing up from a chair using your arms (e.g., wheelchair or bedside chair)?: A Little Help needed to walk in hospital room?: A Little Help needed climbing 3-5 steps with a railing? : A Little 6 Click Score: 20    End of Session   Activity Tolerance: Patient tolerated treatment well Patient left: in bed;with call bell/phone within reach Nurse Communication: Mobility status;Patient requests pain meds PT Visit Diagnosis: Difficulty in walking, not elsewhere classified (R26.2)     Time: 2952-8413 PT Time Calculation (min) (ACUTE ONLY): 13 min  Charges:    $Gait Training: 8-22 mins PT General Charges $$ ACUTE PT VISIT: 1 Visit                     Johny Shock, PTA Acute Rehabilitation Services Secure Chat Preferred  [phone]    Johny Shock 03/07/2023, 9:31 AM

## 2023-03-07 NOTE — Discharge Instructions (Signed)

## 2023-03-07 NOTE — Progress Notes (Addendum)
  Progress Note    03/07/2023 7:52 AM 2 Days Post-Op  Subjective:  feeling great and ready to go home    Vitals:   03/06/23 2324 03/07/23 0422  BP: 111/75 (!) 105/58  Pulse: (!) 106 88  Resp: 14 14  Temp: 98.7 F (37.1 C) 98.3 F (36.8 C)  SpO2: 99% 100%    Physical Exam: General:  resting comfortably Cardiac:  regular Lungs:  nonlabored Incisions:  L groin and distal thigh incisions c/d/l Extremities:  palpable left DP. Left GT and heel ulcerations dry  CBC    Component Value Date/Time   WBC 9.5 03/06/2023 0305   RBC 3.15 (L) 03/06/2023 0305   HGB 9.5 (L) 03/06/2023 0305   HCT 29.0 (L) 03/06/2023 0305   PLT 179 03/06/2023 0305   MCV 92.1 03/06/2023 0305   MCH 30.2 03/06/2023 0305   MCHC 32.8 03/06/2023 0305   RDW 13.5 03/06/2023 0305   LYMPHSABS 3.3 02/26/2023 1110   MONOABS 1.3 (H) 02/26/2023 1110   EOSABS 0.3 02/26/2023 1110   BASOSABS 0.1 02/26/2023 1110    BMET    Component Value Date/Time   NA 133 (L) 03/06/2023 0305   K 3.7 03/06/2023 0305   CL 99 03/06/2023 0305   CO2 26 03/06/2023 0305   GLUCOSE 315 (H) 03/06/2023 0305   BUN 11 03/06/2023 0305   CREATININE 0.71 03/06/2023 0305   CREATININE 0.71 03/09/2014 0916   CALCIUM 7.9 (L) 03/06/2023 0305   GFRNONAA >60 03/06/2023 0305   GFRNONAA >89 03/09/2014 0916   GFRAA >89 03/09/2014 0916    INR No results found for: "INR"   Intake/Output Summary (Last 24 hours) at 03/07/2023 0752 Last data filed at 03/06/2023 1246 Gross per 24 hour  Intake 240 ml  Output --  Net 240 ml      Assessment/Plan:  62 y.o. female is 2 days post op, s/p: L femoral to AK popliteal w PTFE    -L groin and left thigh incisions c/d/l without hematoma -LLE well perfused with palpable DP -Mobilized well with PT/OT. Ready to go home -L GT and heel ulcerations are dry. I have counseled the patient to keep these clean and dry at home. She will be seeing podiatry outpatient next Thursday -Continue ASA, plavix, and  statin. Ok for d/c today -Will arrange follow up with our office in 2-3 wks for incision check   Loel Dubonnet, PA-C [facility] [phone] 03/07/2023 7:52 AM   I have seen and evaluated the patient. I agree with the PA note as documented above.  Status post left common femoral to above-knee popliteal bypass with PTFE on Tuesday with Dr. Karin Lieu.  Incisions look great.  Palpable pedal pulse.  She has follow-up with podiatry next week for tissue loss.  Aspirin Plavix statin as discussed.  Will arrange follow-up in our office in 2 to 3 weeks.  Okay for discharge from our standpoint.  Cephus Shelling, MD [facility] Office: [phone]

## 2023-03-07 NOTE — Telephone Encounter (Signed)
Pt currently in hospital.

## 2023-03-08 ENCOUNTER — Telehealth: Payer: Self-pay

## 2023-03-08 NOTE — Telephone Encounter (Signed)
Returned pt's call regarding her medication refill questions. She was not home and her sister stated she would give her the message that we called her back.

## 2023-03-08 NOTE — Telephone Encounter (Signed)
Pt appt scheduled

## 2023-03-12 ENCOUNTER — Telehealth: Payer: Self-pay

## 2023-03-12 ENCOUNTER — Ambulatory Visit
Admission: RE | Admit: 2023-03-12 | Discharge: 2023-03-12 | Disposition: A | Discharge status: Home / Self Care | Payer: Medicaid Other | Source: Ambulatory Visit | Attending: Hematology and Oncology | Admitting: Hematology and Oncology

## 2023-03-12 DIAGNOSIS — Z853 Personal history of malignant neoplasm of breast: Secondary | ICD-10-CM

## 2023-03-12 NOTE — Telephone Encounter (Signed)
Pt called after hours line before clinic opened to report swollen vulva.  Reviewed pt's chart, returned call for clarification, two identifiers used. Pt denies any bruising or groin swelling. She denies any itching, burning, or redness. She states that the swelling has improved. Instructed her to monitor and use a warm compress/ice pack to help with swelling and call if worsening symptoms. Confirmed understanding.

## 2023-03-14 ENCOUNTER — Ambulatory Visit: Payer: Medicaid Other | Admitting: Podiatry

## 2023-03-19 ENCOUNTER — Telehealth: Payer: Self-pay

## 2023-03-19 NOTE — Telephone Encounter (Signed)
Pt called stating that she had hit her groin incision and it had started bleeding. The bleeding has now stopped.  Reviewed pt's chart, returned call for clarification, two identifiers used. Pt was at sink and she hit her groin incision on the corner of the cabinet. She denies any pain and it stopped bleeding quickly. Pt denies any other symptoms. Instructed her to wash it gently and monitor. Will call back if necessary.

## 2023-03-26 ENCOUNTER — Ambulatory Visit (INDEPENDENT_AMBULATORY_CARE_PROVIDER_SITE_OTHER): Payer: Medicaid Other | Admitting: Podiatry

## 2023-03-26 ENCOUNTER — Encounter: Payer: Self-pay | Admitting: Podiatry

## 2023-03-26 ENCOUNTER — Telehealth: Payer: Self-pay

## 2023-03-26 DIAGNOSIS — E089 Diabetes mellitus due to underlying condition without complications: Secondary | ICD-10-CM | POA: Diagnosis not present

## 2023-03-26 DIAGNOSIS — L97421 Non-pressure chronic ulcer of left heel and midfoot limited to breakdown of skin: Secondary | ICD-10-CM | POA: Diagnosis not present

## 2023-03-26 DIAGNOSIS — I739 Peripheral vascular disease, unspecified: Secondary | ICD-10-CM | POA: Diagnosis not present
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
## 2023-03-28 ENCOUNTER — Telehealth: Payer: Self-pay | Admitting: Podiatry

## 2023-03-28 MED ORDER — MUPIROCIN 2 % EX OINT: 1.0000 | TOPICAL_OINTMENT | Freq: Every day | CUTANEOUS | 2 refills | Status: DC

## 2023-03-28 NOTE — Telephone Encounter (Signed)
Left message for pt medication was sent in and to call if any issues

## 2023-03-28 NOTE — Telephone Encounter (Signed)
Pt called and went to [facility] yesterday as there was something ot have been called in and they did not have anything for her. Please advise

## 2023-03-29 ENCOUNTER — Encounter: Payer: Self-pay | Admitting: Physician Assistant

## 2023-03-29 ENCOUNTER — Ambulatory Visit (INDEPENDENT_AMBULATORY_CARE_PROVIDER_SITE_OTHER): Payer: Medicaid Other | Admitting: Physician Assistant

## 2023-03-29 VITALS — BP 133/72 | HR 96 | Temp 98.0°F | Resp 16 | Ht 60.0 in | Wt 88.8 lb

## 2023-03-29 DIAGNOSIS — I70222 Atherosclerosis of native arteries of extremities with rest pain, left leg: Secondary | ICD-10-CM

## 2023-03-29 NOTE — Progress Notes (Signed)
POST OPERATIVE OFFICE NOTE    CC:  F/u for surgery  HPI:  Martha Lozano is a 62 y.o. female who is s/p left common femoral to above-knee popliteal artery bypass with PTFE on 03/05/2023 by Dr. Karin Lieu.  This was performed for critical limb ischemia with tissue loss at the great toe and heel.  Pt returns today for follow up.  She has been doing well since surgery.  She was seen by podiatry 2 days ago and got good reports on the healing status of her great toe and heel ulcers.  She will see podiatry again in 6 months.  She denies any fevers, erythema, or drainage from her incisions.  She did develop a small "lump" at the superior pole of her left groin incision, however this area is getting smaller and is non-tender.   She denies any claudication or rest pain.   No Known Allergies  Current Outpatient Medications  Medication Sig Dispense Refill   aspirin EC 81 MG tablet Take 1 tablet (81 mg total) by mouth daily. Swallow whole. 30 tablet 0   atorvastatin (LIPITOR) 40 MG tablet Take 1 tablet (40 mg total) by mouth daily. 30 tablet 0   Buprenorphine HCl-Naloxone HCl (SUBOXONE) 12-3 MG FILM Place 1 Film under the tongue 2 (two) times daily.      clopidogrel (PLAVIX) 75 MG tablet Take 1 tablet (75 mg total) by mouth daily at 6 (six) AM. 30 tablet 0   gabapentin (NEURONTIN) 100 MG capsule Take 100 mg by mouth 3 (three) times daily.     glipiZIDE (GLUCOTROL XL) 5 MG 24 hr tablet Take 1 tablet (5 mg total) by mouth daily with breakfast. 90 tablet 3   HYDROcodone-acetaminophen (NORCO/VICODIN) 5-325 MG tablet Take 1 tablet by mouth every 6 (six) hours as needed for moderate pain. 20 tablet 0   levothyroxine (SYNTHROID) 100 MCG tablet Take 1 tablet (100 mcg total) by mouth daily before breakfast.     losartan (COZAAR) 50 MG tablet Take 0.5 tablets (25 mg total) by mouth daily.     metFORMIN (GLUCOPHAGE) 500 MG tablet Take 1 tablet (500 mg total) by mouth 2 (two) times daily with a meal. 60 tablet 0    mupirocin ointment (BACTROBAN) 2 % Apply 1 Application topically daily. 30 g 2   naloxone (NARCAN) nasal spray 4 mg/0.1 mL SMARTSIG:Both Nares     venlafaxine XR (EFFEXOR-XR) 37.5 MG 24 hr capsule Take by mouth.     No current facility-administered medications for this visit.     ROS:  See HPI  Physical Exam:  Incision: Left groin and above-knee popliteal incisions well-healed without erythema, dehiscence, or drainage.  Small hematoma at superior pole of left groin incision Extremities: Palpable left DP pulse. Small, dry ulcer of left heel Neuro: intact motor and sensation of LLE    Assessment/Plan:  This is a 62 y.o. female who is here for a post op check  -She is s/p left iliofemoral endarterectomy and left femoral to above knee popliteal bypass on 03/05/2023 -Her left groin and above knee popliteal incisions are well healed without signs of infection. There seems to be a small, resolving hematoma at the left groin incision which should go away on its own -Her left lower extremity is warm and well perfused with a palpable DP pulse -Her left GT and heel ulcerations are improving and under the care of podiatry  -She can follow up with our office in 4-5 weeks with ABIs and LLE BPG duplex  Loel Dubonnet, PA-C [facility] [phone]   Clinic MD:  [facility]

## 2023-03-31 NOTE — Progress Notes (Signed)
Subjective:  Patient ID: Martha Lozano, female    DOB: 05/13/1961,  MRN: [id number]  Chief Complaint  Patient presents with   Wound Check    Patient is here for follow up on cellulitis and wound care, patient has multiple callouses and open wound on back of left heel    62 y.o. female presents with the above complaint. History confirmed with patient.   Objective:  Physical Exam: warm, good capillary refill and weakly palpable DP pulse, there is a posterior heel ulceration partial-thickness in nature Limited regular skin with no signs of infection   IMPRESSION: No evidence of osteomyelitis. Specifically, the 1st digit and posterior calcaneus remain intact.   16 mm subcutaneous fluid collection along the medial aspect of the 1st proximal phalanx, suspicious for tiny subcutaneous boil/abscess.     Electronically Signed   By: Charline Bills M.D.   On: 02/26/2023 00:01    IMPRESSION: 1. Focal skin ulceration along the plantar aspect of the proximal phalanx of the great toe with nearby ill-defined subcutaneous collection medial to the proximal phalanx of the great toe suspicious for a small abscess. 2. No definitive signs of osteomyelitis or septic arthritis. Nonspecific acro-osteolysis of the distal 1st phalanx which has a broad differential, including connective tissue, vascular, thermal and arthropathic etiologies. Correlate clinically. 3. No evidence of forefoot tendon tear or suspicious tenosynovitis.     Electronically Signed   By: Carey Bullocks M.D.   On: 02/26/2023 10:09 Assessment:   1. 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 PAD (peripheral artery disease) (HCC)   3. Diabetes mellitus due to underlying condition without complication, unspecified whether long term insulin use (HCC)      Plan:  Patient was evaluated and treated and all questions answered.  She returns for follow-up after hospitalization and recent  bypass surgery on the left lower extremity, her wounds appear to be healing well and are free of signs of infection we discussed offloading of the heel to maintain this in a pressure free environment so the wound does not worsen, we also discussed utilizing Pearson ointment to keep it clean and dry.  Rx was sent to pharmacy in a separate encounter.  Follow-up in 6 weeks for reevaluation.  Return in about 6 weeks (around 05/07/2023) for wound care.

## 2023-04-05 ENCOUNTER — Other Ambulatory Visit: Payer: Self-pay

## 2023-04-05 DIAGNOSIS — I70222 Atherosclerosis of native arteries of extremities with rest pain, left leg: Secondary | ICD-10-CM

## 2023-05-02 ENCOUNTER — Ambulatory Visit (HOSPITAL_COMMUNITY): Payer: Medicaid Other | Attending: Vascular Surgery

## 2023-05-02 ENCOUNTER — Ambulatory Visit (HOSPITAL_COMMUNITY): Payer: Medicaid Other

## 2023-05-02 ENCOUNTER — Ambulatory Visit: Payer: Medicaid Other

## 2023-05-07 ENCOUNTER — Ambulatory Visit: Payer: Medicaid Other | Admitting: Podiatry

## 2023-06-03 ENCOUNTER — Encounter: Payer: Self-pay | Admitting: Podiatry

## 2023-06-03 ENCOUNTER — Ambulatory Visit (INDEPENDENT_AMBULATORY_CARE_PROVIDER_SITE_OTHER): Payer: Medicaid Other | Admitting: Podiatry

## 2023-06-03 DIAGNOSIS — L97421 Non-pressure chronic ulcer of left heel and midfoot limited to breakdown of skin: Secondary | ICD-10-CM | POA: Diagnosis not present

## 2023-06-03 NOTE — Progress Notes (Signed)
  Subjective:  Patient ID: Martha Lozano, female    DOB: 11-26-1960,  MRN: [id number]  Chief Complaint  Patient presents with   Foot Pain    RM#21 6 week follow left foot pain patient states is doing well has no concerns at this time.    62 y.o. female presents with the above complaint. History confirmed with patient.  She notes doing well no new issues  Objective:  Physical Exam: warm, good capillary refill and weakly palpable DP pulse, posterior heel ulceration has fully healed  IMPRESSION: No evidence of osteomyelitis. Specifically, the 1st digit and posterior calcaneus remain intact.   16 mm subcutaneous fluid collection along the medial aspect of the 1st proximal phalanx, suspicious for tiny subcutaneous boil/abscess.     Electronically Signed   By: Charline Bills M.D.   On: 02/26/2023 00:01    IMPRESSION: 1. Focal skin ulceration along the plantar aspect of the proximal phalanx of the great toe with nearby ill-defined subcutaneous collection medial to the proximal phalanx of the great toe suspicious for a small abscess. 2. No definitive signs of osteomyelitis or septic arthritis. Nonspecific acro-osteolysis of the distal 1st phalanx which has a broad differential, including connective tissue, vascular, thermal and arthropathic etiologies. Correlate clinically. 3. No evidence of forefoot tendon tear or suspicious tenosynovitis.     Electronically Signed   By: Carey Bullocks M.D.   On: 02/26/2023 10:09 Assessment:   1. 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  Doing much better her ulceration is fully healed she does have some thickened skin here.  I recommend using urea 40% or O'Keefe's foot cream for this.  She will follow-up me as needed advised to check her feet and inspect them on a daily basis, she will follow-up me as needed if it returns or  worsens  Return if symptoms worsen or fail to improve.

## 2023-06-11 ENCOUNTER — Other Ambulatory Visit: Payer: Medicaid Other

## 2023-06-17 ENCOUNTER — Ambulatory Visit (HOSPITAL_COMMUNITY): Payer: Medicaid Other | Attending: Vascular Surgery

## 2023-06-17 ENCOUNTER — Ambulatory Visit (HOSPITAL_COMMUNITY): Payer: Medicaid Other

## 2023-06-20 ENCOUNTER — Ambulatory Visit: Payer: Medicaid Other

## 2023-06-20 ENCOUNTER — Encounter (HOSPITAL_COMMUNITY): Payer: Medicaid Other

## 2023-06-20 ENCOUNTER — Other Ambulatory Visit (HOSPITAL_COMMUNITY): Payer: Medicaid Other

## 2023-07-18 ENCOUNTER — Ambulatory Visit (HOSPITAL_COMMUNITY)
Admission: RE | Admit: 2023-07-18 | Discharge: 2023-07-18 | Disposition: A | Discharge status: Home / Self Care | Payer: Medicaid Other | Source: Ambulatory Visit | Attending: Vascular Surgery | Admitting: Vascular Surgery

## 2023-07-18 ENCOUNTER — Ambulatory Visit: Payer: Medicaid Other | Admitting: Physician Assistant

## 2023-07-18 ENCOUNTER — Ambulatory Visit (INDEPENDENT_AMBULATORY_CARE_PROVIDER_SITE_OTHER)
Admission: RE | Admit: 2023-07-18 | Discharge: 2023-07-18 | Disposition: A | Discharge status: Home / Self Care | Payer: Medicaid Other | Source: Ambulatory Visit | Attending: Vascular Surgery | Admitting: Vascular Surgery

## 2023-07-18 VITALS — BP 154/81 | HR 92 | Temp 98.0°F | Resp 18 | Ht 60.0 in | Wt 93.0 lb

## 2023-07-18 DIAGNOSIS — I70222 Atherosclerosis of native arteries of extremities with rest pain, left leg: Secondary | ICD-10-CM

## 2023-07-18 LAB — VAS US ABI WITH/WO TBI
Left ABI: 0.58
Right ABI: 0.44

## 2023-07-18 NOTE — Progress Notes (Signed)
 Office Note   History of Present Illness   Martha Lozano is a 63 y.o. (03/03/1961) female who presents for surveillance of PAD.  She has a history of left common femoral to above-knee popliteal artery bypass with PTFE on 03/05/2023 by Dr. Lanis.  This was done for critical limb ischemia with tissue loss at the great toe and heel.  She returns today for follow-up.  Since her last visit with our office, her wounds of the left great toe and heel are completely healed.  She denies any new wounds of either of her lower extremities.  She denies any rest pain or claudication.  She is taking a daily aspirin , Plavix , statin.  Current Outpatient Medications  Medication Sig Dispense Refill   aspirin  EC 81 MG tablet Take 1 tablet (81 mg total) by mouth daily. Swallow whole. 30 tablet 0   atorvastatin  (LIPITOR) 40 MG tablet Take 1 tablet (40 mg total) by mouth daily. 30 tablet 0   Buprenorphine  HCl-Naloxone  HCl (SUBOXONE ) 12-3 MG FILM Place 1 Film under the tongue 2 (two) times daily.      clopidogrel  (PLAVIX ) 75 MG tablet Take 1 tablet (75 mg total) by mouth daily at 6 (six) AM. 30 tablet 0   gabapentin  (NEURONTIN ) 100 MG capsule Take 100 mg by mouth 3 (three) times daily.     glipiZIDE  (GLUCOTROL  XL) 5 MG 24 hr tablet Take 1 tablet (5 mg total) by mouth daily with breakfast. 90 tablet 3   HYDROcodone -acetaminophen  (NORCO/VICODIN) 5-325 MG tablet Take 1 tablet by mouth every 6 (six) hours as needed for moderate pain. 20 tablet 0   levothyroxine  (SYNTHROID ) 100 MCG tablet Take 1 tablet (100 mcg total) by mouth daily before breakfast.     losartan  (COZAAR ) 50 MG tablet Take 0.5 tablets (25 mg total) by mouth daily.     metFORMIN  (GLUCOPHAGE ) 500 MG tablet Take 1 tablet (500 mg total) by mouth 2 (two) times daily with a meal. 60 tablet 0   mupirocin  ointment (BACTROBAN ) 2 % Apply 1 Application topically daily. 30 g 2   naloxone  (NARCAN ) nasal spray 4 mg/0.1 mL SMARTSIG:Both Nares      venlafaxine  XR (EFFEXOR -XR) 37.5 MG 24 hr capsule Take by mouth.     No current facility-administered medications for this visit.    REVIEW OF SYSTEMS (negative unless checked):   Cardiac:  []  Chest pain or chest pressure? []  Shortness of breath upon activity? []  Shortness of breath when lying flat? []  Irregular heart rhythm?  Vascular:  []  Pain in calf, thigh, or hip brought on by walking? []  Pain in feet at night that wakes you up from your sleep? []  Blood clot in your veins? []  Leg swelling?  Pulmonary:  []  Oxygen at home? []  Productive cough? []  Wheezing?  Neurologic:  []  Sudden weakness in arms or legs? []  Sudden numbness in arms or legs? []  Sudden onset of difficult speaking or slurred speech? []  Temporary loss of vision in one eye? []  Problems with dizziness?  Gastrointestinal:  []  Blood in stool? []  Vomited blood?  Genitourinary:  []  Burning when urinating? []  Blood in urine?  Psychiatric:  []  Major depression  Hematologic:  []  Bleeding problems? []  Problems with blood clotting?  Dermatologic:  []  Rashes or ulcers?  Constitutional:  []  Fever or chills?  Ear/Nose/Throat:  []  Change in hearing? []  Nose bleeds? []  Sore throat?  Musculoskeletal:  []  Back pain? []  Joint pain? []  Muscle pain?   Physical Examination  Vitals:   07/18/23 0907  BP: (!) 154/81  Pulse: 92  Resp: 18  Temp: 98 F (36.7 C)  TempSrc: Temporal  SpO2: 98%  Weight: 93 lb (42.2 kg)  Height: 5' (1.524 m)   Body mass index is 18.16 kg/m.  General:  WDWN in NAD; vital signs documented above Gait: Not observed HENT: WNL, normocephalic Pulmonary: normal non-labored breathing , without rales, rhonchi,  wheezing Cardiac: regular Abdomen: soft, NT, no masses Skin: without rashes Vascular Exam/Pulses: Monophasic bilateral DP/PT Doppler signals.  Extremities: without ischemic changes, without gangrene , without cellulitis; without open wounds;  Musculoskeletal: no  muscle wasting or atrophy  Neurologic: A&O X 3;  No focal weakness or paresthesias are detected Psychiatric:  The pt has Normal affect.  Non-Invasive Vascular Imaging ABI (07/18/2023) R:  ABI: 0.44 (0.38),  PT: mono DP: mono TBI:  0.34 L:  ABI: 0.58 (0.48),  PT: mono DP: mono TBI: 0.33   LLE Bypass Duplex (07/18/2023) Left Graft #1: CFA to AK pop  +--------------------+--------+--------+----------+--------------+                     PSV cm/sStenosisWaveform  Comments        +--------------------+--------+--------+----------+--------------+  Inflow             116             triphasic                 +--------------------+--------+--------+----------+--------------+  Proximal Anastomosis57              monophasic                +--------------------+--------+--------+----------+--------------+  Proximal Graft              occluded                          +--------------------+--------+--------+----------+--------------+  Mid Graft                   occluded                          +--------------------+--------+--------+----------+--------------+  Distal Graft                occluded                          +--------------------+--------+--------+----------+--------------+  Distal Anastomosis  123                       via native SFA  +--------------------+--------+--------+----------+--------------+  Outflow            60              monophasic                +--------------------+--------+--------+----------+--------------+    Medical Decision Making   Martha Lozano is a 63 y.o. female who presents for surveillance of PAD  Based on the patient's vascular studies and examination, her ABIs on the left are slightly increased since her last visit.  Her left ABI went from 0.48 to 0.58.  Her right ABI is essentially unchanged Duplex of her LLE demonstrates an occluded left CFA to AK pop bypass with possible distal  reconstitution via native SFA. This was likely a silent occlusion given that she is asymptomatic. It is difficult to determine when her bypass went down Her  tissue loss of the left great toe and heel is completely healed.  She has no further wounds.  She denies any rest pain or claudication.  On exam she has monophasic bilateral DP/PT Doppler signals She does not require repeat intervention at this time since she is asymptomatic. She should continue to take her aspirin , Plavix , statin.  She can follow-up with our office in 6 months with repeat left lower extremity bypass graft duplex and ABIs   Ahmed Holster PA-C [facility] Office: [phone]  Clinic MD: Lanis

## 2023-07-30 ENCOUNTER — Other Ambulatory Visit: Payer: Self-pay

## 2023-07-30 DIAGNOSIS — I70222 Atherosclerosis of native arteries of extremities with rest pain, left leg: Secondary | ICD-10-CM

## 2023-08-23 ENCOUNTER — Telehealth: Payer: Self-pay

## 2023-08-23 NOTE — Telephone Encounter (Signed)
 Caller: Patient  Concern: Pt fell twice when walking her dog out to the mailbox, c/o posterior calf pain with ambulation.  Pt denies redness, warmth, swelling, wounds, discoloration, or rest pain  Location: left leg  Description:  since yesterday  Aggravating Factors: walking  Treatments:  Instructed her to rest her leg as much as possible over the next few days  Resolution: Instructed patient to proceed to nearest urgent care if her symptoms worsened and Instructed to call back if symptoms persist and she needs an appt

## 2023-08-29 ENCOUNTER — Telehealth: Payer: Self-pay

## 2023-08-29 NOTE — Telephone Encounter (Signed)
Triage - Walk In: -pt walks into clinic reporting her L big toe is red/blue/painful -on chart review, she had recently called 08/22/23 stating she had 2 recent falls.  She is s/p L common femoral to above-knee popliteal artery bypass with PTFE on 03/05/2023 by Dr. Karin Lieu and had post op in our office on 07/18/2023 in which she was asymptomatic considering studies. -pt states pain started in there LLE-foot 2-3 days ago.  On visual there is a dark area in the nail bed approx 1/2 the size of her nail that she states that nail has been doing that for a long time then it falls off and grows back.  Overall L foot appears pale and is cool to the touch which she states her hands and feel are always cool.  Her R foot is red along the lateral aspect of the sole and all 5 toes are red and swollen with +1-2.  She denies R leg/foot pain or trauma. -Will order duplex and ABI's as recommended for follow-up and she confirms understanding that any worsening symptoms, she should report to the ED

## 2023-08-30 NOTE — Telephone Encounter (Signed)
Appointment: -appt not booked.   -pt reports symptoms have resolved. -pt confirms understanding to call the office if appt needed.

## 2023-09-10 ENCOUNTER — Telehealth: Payer: Self-pay

## 2023-09-10 NOTE — Telephone Encounter (Signed)
 Triage call:  -message from RA that pt called stating she needed to get an appt for her leg - but if it was too bad she would go to the ED. -returned call to patient w/o answer and LM on mobile phone.

## 2023-10-01 ENCOUNTER — Emergency Department (HOSPITAL_COMMUNITY)

## 2023-10-01 ENCOUNTER — Emergency Department (HOSPITAL_COMMUNITY)
Admission: EM | Admit: 2023-10-01 | Discharge: 2023-10-01 | Disposition: A | Discharge status: Home / Self Care | Attending: Emergency Medicine | Admitting: Emergency Medicine

## 2023-10-01 ENCOUNTER — Other Ambulatory Visit: Payer: Self-pay

## 2023-10-01 DIAGNOSIS — E11621 Type 2 diabetes mellitus with foot ulcer: Secondary | ICD-10-CM | POA: Diagnosis not present

## 2023-10-01 DIAGNOSIS — Z7984 Long term (current) use of oral hypoglycemic drugs: Secondary | ICD-10-CM | POA: Diagnosis not present

## 2023-10-01 DIAGNOSIS — Z853 Personal history of malignant neoplasm of breast: Secondary | ICD-10-CM | POA: Insufficient documentation

## 2023-10-01 DIAGNOSIS — I70222 Atherosclerosis of native arteries of extremities with rest pain, left leg: Secondary | ICD-10-CM

## 2023-10-01 DIAGNOSIS — F17210 Nicotine dependence, cigarettes, uncomplicated: Secondary | ICD-10-CM | POA: Diagnosis not present

## 2023-10-01 DIAGNOSIS — M79672 Pain in left foot: Secondary | ICD-10-CM

## 2023-10-01 DIAGNOSIS — L97529 Non-pressure chronic ulcer of other part of left foot with unspecified severity: Secondary | ICD-10-CM | POA: Insufficient documentation

## 2023-10-01 DIAGNOSIS — Z7982 Long term (current) use of aspirin: Secondary | ICD-10-CM | POA: Diagnosis not present

## 2023-10-01 DIAGNOSIS — R000 Tachycardia, unspecified: Secondary | ICD-10-CM | POA: Insufficient documentation

## 2023-10-01 LAB — CBC
HCT: 40.3 % (ref 36.0–46.0)
Hemoglobin: 13 g/dL (ref 12.0–15.0)
MCH: 28.3 pg (ref 26.0–34.0)
MCHC: 32.3 g/dL (ref 30.0–36.0)
MCV: 87.8 fL (ref 80.0–100.0)
Platelets: 222 10*3/uL (ref 150–400)
RBC: 4.59 MIL/uL (ref 3.87–5.11)
RDW: 13.5 % (ref 11.5–15.5)
WBC: 9.2 10*3/uL (ref 4.0–10.5)
nRBC: 0 % (ref 0.0–0.2)

## 2023-10-01 LAB — BASIC METABOLIC PANEL
Anion gap: 10 (ref 5–15)
BUN: 24 mg/dL — ABNORMAL HIGH (ref 8–23)
CO2: 27 mmol/L (ref 22–32)
Calcium: 9.1 mg/dL (ref 8.9–10.3)
Chloride: 99 mmol/L (ref 98–111)
Creatinine, Ser: 0.85 mg/dL (ref 0.44–1.00)
GFR, Estimated: >60 mL/min (ref >60)
Glucose, Bld: 198 mg/dL — ABNORMAL HIGH (ref 70–99)
Potassium: 4 mmol/L (ref 3.5–5.1)
Sodium: 136 mmol/L (ref 135–145)

## 2023-10-01 MED ORDER — IBUPROFEN 400 MG PO TABS
ORAL_TABLET | ORAL | Status: AC
  Filled 2023-10-01: qty 1

## 2023-10-01 MED ORDER — BUPRENORPHINE HCL-NALOXONE HCL 8-2 MG SL SUBL
1.0000 | SUBLINGUAL_TABLET | Freq: Once | SUBLINGUAL | Status: AC
  Administered 2023-10-01: 1 via SUBLINGUAL
  Filled 2023-10-01: qty 1

## 2023-10-01 NOTE — ED Provider Notes (Signed)
 [place] EMERGENCY DEPARTMENT AT [facility] Provider Note   CSN: [id number] Arrival date & time: 10/01/23  1048     History  Chief Complaint  Patient presents with   Foot Pain    Martha Lozano is a 63 y.o. female.   Foot Pain   Patient presents with worsening left foot pain.  Had a iliofemoral endarterectomy and femoral to above-knee popliteal artery bypass back in August 2024.  About a month ago, she had fallen on the foot, and her foot has been hurting worse ever since then.  She also hit her knee at that time, though does not have any knee pain today.  She also notes that she has ulcerations at her feet which are normal for her given her diabetes.    Home Medications Prior to Admission medications   Medication Sig Start Date End Date Taking? Authorizing Provider  aspirin EC 81 MG tablet Take 1 tablet (81 mg total) by mouth daily. Swallow whole. 03/08/23   Glade Lloyd, MD  atorvastatin (LIPITOR) 40 MG tablet Take 1 tablet (40 mg total) by mouth daily. 03/08/23   Glade Lloyd, MD  Buprenorphine HCl-Naloxone HCl (SUBOXONE) 12-3 MG FILM Place 1 Film under the tongue 2 (two) times daily.     [provider]  clopidogrel (PLAVIX) 75 MG tablet Take 1 tablet (75 mg total) by mouth daily at 6 (six) AM. 03/08/23   Glade Lloyd, MD  gabapentin (NEURONTIN) 100 MG capsule Take 100 mg by mouth 3 (three) times daily. 11/22/20   [provider]  glipiZIDE (GLUCOTROL XL) 5 MG 24 hr tablet Take 1 tablet (5 mg total) by mouth daily with breakfast. 11/06/18   Serena Croissant, MD  HYDROcodone-acetaminophen (NORCO/VICODIN) 5-325 MG tablet Take 1 tablet by mouth every 6 (six) hours as needed for moderate pain. 03/07/23   Emilie Rutter, PA-C  levothyroxine (SYNTHROID) 100 MCG tablet Take 1 tablet (100 mcg total) by mouth daily before breakfast. 11/15/20   Serena Croissant, MD  losartan (COZAAR) 50 MG tablet Take 0.5 tablets (25 mg total) by mouth daily. 03/07/23    Glade Lloyd, MD  metFORMIN (GLUCOPHAGE) 500 MG tablet Take 1 tablet (500 mg total) by mouth 2 (two) times daily with a meal. 03/07/23   Glade Lloyd, MD  mupirocin ointment (BACTROBAN) 2 % Apply 1 Application topically daily. 03/28/23   Edwin Cap, DPM  naloxone [facility]) nasal spray 4 mg/0.1 mL SMARTSIG:Both Nares 11/08/20   [provider]  venlafaxine XR (EFFEXOR-XR) 37.5 MG 24 hr capsule Take by mouth. 11/11/18   [provider]      Allergies    Patient has no known allergies.    Review of Systems   Review of Systems  Physical Exam Updated Vital Signs BP (!) 173/91 (BP Location: Right Arm)   Pulse (!) 105   Temp 98.2 F (36.8 C)   Resp 20   Ht 5' (1.524 m)   Wt 43.1 kg   LMP 02/21/2016   SpO2 99%   BMI 18.55 kg/m  Physical Exam Constitutional:      General: She is not in acute distress.    Appearance: Normal appearance. She is not ill-appearing.  HENT:     Head: Normocephalic and atraumatic.  Eyes:     Extraocular Movements: Extraocular movements intact.     Conjunctiva/sclera: Conjunctivae normal.  Cardiovascular:     Rate and Rhythm: Regular rhythm. Tachycardia present.     Pulses:  Dorsalis pedis pulses are detected w/ Doppler on the right side.     Heart sounds: Normal heart sounds.  Pulmonary:     Effort: Pulmonary effort is normal. No respiratory distress.     Breath sounds: Normal breath sounds.  Abdominal:     General: Abdomen is flat. Bowel sounds are normal.     Palpations: Abdomen is soft.     Tenderness: There is no abdominal tenderness.  Musculoskeletal:     Cervical back: Normal range of motion.     Right foot: Normal range of motion. No deformity.  Feet:     Right foot:     Skin integrity: Ulcer, erythema, callus, dry skin and fissure present.     Comments: TTP overlying fourth and fifth metatarsals as well as over dorsal side of foot proximal to the metatarsals. Hyperpigmented collection underneath great  toenail. Small ulceration on plantar side of first metatarsal. Skin:    General: Skin is warm and dry.     Capillary Refill: Capillary refill takes less than 2 seconds.  Neurological:     General: No focal deficit present.     Mental Status: She is alert.  Psychiatric:        Mood and Affect: Mood normal.        Behavior: Behavior normal.     ED Results / Procedures / Treatments   Labs (all labs ordered are listed, but only abnormal results are displayed) Labs Reviewed  CBC  BASIC METABOLIC PANEL    EKG None  Radiology No results found.  Procedures Procedures    Medications Ordered in ED Medications  buprenorphine-naloxone (SUBOXONE) 8-2 mg per SL tablet 1 tablet (1 tablet Sublingual Given 10/01/23 1432)    ED Course/ Medical Decision Making/ A&P                                 Medical Decision Making Amount and/or Complexity of Data Reviewed Labs: ordered. Radiology: ordered.  Risk Prescription drug management.   63 year old female with a history of PAD, breast cancer, T2DM, hepatitis C, and substance use here with concerns for worsening left foot pain.  Vitals notable for mild tachycardia and hypertension.  Exam notable for pain on palpation as well as ulceration below the great toe.  Differential includes worsening PAD, acute limb ischemia, cellulitis, fracture, compartment syndrome, trauma.  Will give dose of Suboxone for pain.  CBC without leukocytosis.  CMP pending at time of discharge.  Foot x-ray also pending at time of discharge.  Consulted vascular surgery given her recent procedure and evidence of occluded bypass graft who saw patient and recommended arteriogram on Monday, 3/24.  Patient is stable for discharge at this time.  She was instructed to come back to the emergency room if symptoms worsen before follow-up.        Final Clinical Impression(s) / ED Diagnoses Final diagnoses:  Foot pain, left    Rx / DC Orders ED Discharge Orders      None         Evette Georges, MD 10/01/23 1434    Melene Plan, DO 10/01/23 [phone]

## 2023-10-01 NOTE — ED Notes (Signed)
 Doppler to left foot. Positive pulse.

## 2023-10-01 NOTE — ED Triage Notes (Signed)
 Pt. Stated, I had left foot surgery in August for circulation and then I fell a month ago and ever since I fell off and on its been hurting and my foot has decreased in circulation and hurts and is red

## 2023-10-01 NOTE — ED Notes (Signed)
 Patient transported to CT

## 2023-10-01 NOTE — Consult Note (Signed)
 ED consult    Reason for Consult: Left foot pain Referring Physician: Dr. Adela Lank MRN #:  [id number]  History of Present Illness This is a 63 y.o. female history of left common femoral to above-knee popliteal artery bypass with Dr. 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  Patient's wounds subsequently healed.  Last evaluation was earlier this year where she was found to have occluded bypass graft.  She has been free of pain until the other day when she hit her toe.  She denies any fevers or chills but states that the toenail hurts.  She presents to the emergency department for pain in the left great toe but states that it has been better since presentation.  She was diagnosed aspirin, Plavix and a statin however she only notes that she takes aspirin and no other blood thinners.  Past Medical History:  Diagnosis Date   Breast cancer, stage 0, right    pt currently taking Tamoxifen   Diabetes mellitus without complication (HCC)    Family history of breast cancer    Family history of prostate cancer    Family history of skin cancer    Hep C w/o coma, chronic (HCC)    Hx of hepatitis C    pt received treatment for Hep C in 2018   Peripheral arterial disease (HCC)    Sciatic pain     Past Surgical History:  Procedure Laterality Date   ABDOMINAL AORTOGRAM W/LOWER EXTREMITY N/A 03/04/2023   Procedure: ABDOMINAL AORTOGRAM W/LOWER EXTREMITY;  Surgeon: Maeola Harman, MD;  Location: [facility] INVASIVE CV LAB;  Service: Cardiovascular;  Laterality: N/A;   BREAST BIOPSY Right 09/12/2017   ENDARTERECTOMY FEMORAL Left 03/05/2023   Procedure: ILIOFEMORAL ENDARTERECTOMY;  Surgeon: Victorino Sparrow, MD;  Location: [facility] OR;  Service: Vascular;  Laterality: Left;   FEMORAL-POPLITEAL BYPASS GRAFT Left 03/05/2023   Procedure: LEFT FEMORAL-ABOVE KNEE POPLITEAL ARTERY BYPASS GRAFT USING PROPATEN RING GRAFT X 80CM;  Surgeon:  Victorino Sparrow, MD;  Location: [facility] OR;  Service: Vascular;  Laterality: Left;   INCISION AND DRAINAGE ABSCESS Left    hand    No Known Allergies  Prior to Admission medications   Medication Sig Start Date End Date Taking? Authorizing Provider  aspirin EC 81 MG tablet Take 1 tablet (81 mg total) by mouth daily. Swallow whole. 03/08/23   Glade Lloyd, MD  atorvastatin (LIPITOR) 40 MG tablet Take 1 tablet (40 mg total) by mouth daily. 03/08/23   Glade Lloyd, MD  Buprenorphine HCl-Naloxone HCl (SUBOXONE) 12-3 MG FILM Place 1 Film under the tongue 2 (two) times daily.     [provider]  clopidogrel (PLAVIX) 75 MG tablet Take 1 tablet (75 mg total) by mouth daily at 6 (six) AM. 03/08/23   Glade Lloyd, MD  gabapentin (NEURONTIN) 100 MG capsule Take 100 mg by mouth 3 (three) times daily. 11/22/20   [provider]  glipiZIDE (GLUCOTROL XL) 5 MG 24 hr tablet Take 1 tablet (5 mg total) by mouth daily with breakfast. 11/06/18   Serena Croissant, MD  HYDROcodone-acetaminophen (NORCO/VICODIN) 5-325 MG tablet Take 1 tablet by mouth every 6 (six) hours as needed for moderate pain. 03/07/23   Emilie Rutter, PA-C  levothyroxine (SYNTHROID) 100 MCG tablet Take 1 tablet (100 mcg total) by mouth daily before breakfast. 11/15/20   Serena Croissant, MD  losartan (COZAAR) 50 MG tablet Take 0.5 tablets (25 mg total)  by mouth daily. 03/07/23   Glade Lloyd, MD  metFORMIN (GLUCOPHAGE) 500 MG tablet Take 1 tablet (500 mg total) by mouth 2 (two) times daily with a meal. 03/07/23   Glade Lloyd, MD  mupirocin ointment (BACTROBAN) 2 % Apply 1 Application topically daily. 03/28/23   Edwin Cap, DPM  naloxone [facility]) nasal spray 4 mg/0.1 mL SMARTSIG:Both Nares 11/08/20   [provider]  venlafaxine XR (EFFEXOR-XR) 37.5 MG 24 hr capsule Take by mouth. 11/11/18   [provider]    Social History   Socioeconomic History   Marital status: Widowed    Spouse name: Not on file    Number of children: Not on file   Years of education: Not on file   Highest education level: Not on file  Occupational History   Not on file  Tobacco Use   Smoking status: Every Day    Current packs/day: 0.33    Average packs/day: 0.3 packs/day for 35.0 years (11.6 ttl pk-yrs)    Types: Cigarettes   Smokeless tobacco: Never  Vaping Use   Vaping status: Some Days   Substances: Nicotine  Substance and Sexual Activity   Alcohol use: Yes    Comment: Occasional   Drug use: Not Currently    Comment: history substance abuse    Sexual activity: Yes    Birth control/protection: None  Other Topics Concern   Not on file  Social History Narrative   Not on file   Social Drivers of Health   Financial Resource Strain: Low Risk  (04/19/2023)   Received from [facility]   Overall Financial Resource Strain (CARDIA)    Difficulty of Paying Living Expenses: Not hard at all  Food Insecurity: No Food Insecurity (04/19/2023)   Received from [facility]   Hunger Vital Sign    Worried About Running Out of Food in the Last Year: Never true    Ran Out of Food in the Last Year: Never true  Transportation Needs: No Transportation Needs (04/19/2023)   Received from [facility] - Transportation    Lack of Transportation (Medical): No    Lack of Transportation (Non-Medical): No  Physical Activity: Insufficiently Active (04/19/2023)   Received from [facility]   Exercise Vital Sign    Days of Exercise per Week: 5 days    Minutes of Exercise per Session: 20 min  Stress: No Stress Concern Present (04/19/2023)   Received from [facility] - Occupational Stress Questionnaire    Feeling of Stress : Not at all  Social Connections: Moderately Integrated (04/19/2023)   Received from [facility]   Social Connection and Isolation Panel [NHANES]    Frequency of Communication with Friends and Family: More than three times a week     Frequency of Social Gatherings with Friends and Family: Three times a week    Attends Religious Services: More than 4 times per year    Active Member of Clubs or Organizations: No    Attends Banker Meetings: 1 to 4 times per year    Marital Status: Widowed  Intimate Partner Violence: Not At Risk (04/19/2023)   Received from [facility]   Humiliation, Afraid, Rape, and Kick questionnaire    Fear of Current or Ex-Partner: No    Emotionally Abused: No    Physically Abused: No    Sexually Abused: No     Family History  Problem Relation Age of Onset   Diabetes Sister    Hypertension Sister    Breast cancer Sister 57   Diabetes Brother    Breast cancer Maternal Grandmother 30   Cervical cancer Mother    Prostate cancer Father 60       metastatic to bones   Cancer Maternal Aunt        type unk dx <50   Skin cancer Paternal Uncle    Pulmonary embolism Maternal Grandfather 66   Cancer Paternal Uncle    Breast cancer Other    Stomach cancer Other     Review of Systems  Constitutional: Negative.   HENT: Negative.    Respiratory: Negative.    Cardiovascular: Negative.   Musculoskeletal:        Left foot pain  Skin: Negative.   Neurological: Negative.   Endo/Heme/Allergies: Negative.   Psychiatric/Behavioral: Negative.        Physical Examination  Vitals:   10/01/23 1053 10/01/23 1441  BP: (!) 173/91 (!) 165/72  Pulse: (!) 105 98  Resp: 20 18  Temp: 98.2 F (36.8 C) (!) 97 F (36.1 C)  SpO2: 99% 100%   Body mass index is 18.55 kg/m.  Physical Exam HENT:     Nose: Nose normal.  Eyes:     Pupils: Pupils are equal, round, and reactive to light.  Cardiovascular:     Pulses:          Femoral pulses are 2+ on the right side and 1+ on the left side. Abdominal:     General: Abdomen is flat.     Palpations: Abdomen is soft.  Skin:    Capillary Refill: Capillary refill takes more than 3 seconds.     Comments: Left foot pictured below   Neurological:     General: No focal deficit present.     Mental Status: She is alert.  Psychiatric:        Mood and Affect: Mood normal.       CBC    Component Value Date/Time   WBC 9.2 10/01/2023 1345   RBC 4.59 10/01/2023 1345   HGB 13.0 10/01/2023 1345   HCT 40.3 10/01/2023 1345   PLT 222 10/01/2023 1345   MCV 87.8 10/01/2023 1345   MCH 28.3 10/01/2023 1345   MCHC 32.3 10/01/2023 1345   RDW 13.5 10/01/2023 1345   LYMPHSABS 3.3 02/26/2023 1110   MONOABS 1.3 (H) 02/26/2023 1110   EOSABS 0.3 02/26/2023 1110   BASOSABS 0.1 02/26/2023 1110    BMET    Component Value Date/Time   NA 136 10/01/2023 1345   K 4.0 10/01/2023 1345   CL 99 10/01/2023 1345   CO2 27 10/01/2023 1345   GLUCOSE 198 (H) 10/01/2023 1345   BUN 24 (H) 10/01/2023 1345   CREATININE 0.85 10/01/2023 1345   CREATININE 0.71 03/09/2014 0916   CALCIUM 9.1 10/01/2023 1345   GFRNONAA >60 10/01/2023 1345   GFRNONAA >89 03/09/2014 0916   GFRAA >89 03/09/2014 0916    COAGS: No results found for: "INR", "PROTIME"   Non-Invasive Vascular Imaging:   No new studies  ASSESSMENT/PLAN: This is a 63 y.o. 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  She has a known left common femoral to below-knee popliteal artery bypass.  We discussed proceeding with repeat angiography given a limb threatening nature of her disease process and she demonstrates good understanding.  Patient states that she cannot stay in the hospital today  and as such I will have tried to her to arrange angiogram next week.  She can continue aspirin and Plavix periprocedurally and she demonstrates good understanding of the risk and benefits.  The proper discharged and the office will contact her.  Jasmeet Manton C. Randie Heinz, MD [facility] Office: [phone] Pager: [phone]

## 2023-10-01 NOTE — Discharge Instructions (Signed)
 Your lab results look reassuring.  You will follow-up with vascular surgery on Monday 3/24 to look at the blood vessels in your leg.  If your symptoms worsen before then, recommend returning to the emergency room.

## 2023-10-01 NOTE — H&P (View-Only) (Signed)
 ED consult    Reason for Consult: Left foot pain Referring Physician: Dr. Adela Lank MRN #:  [id number]  History of Present Illness This is a 63 y.o. female history of left common femoral to above-knee popliteal artery bypass with Dr. 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  Patient's wounds subsequently healed.  Last evaluation was earlier this year where she was found to have occluded bypass graft.  She has been free of pain until the other day when she hit her toe.  She denies any fevers or chills but states that the toenail hurts.  She presents to the emergency department for pain in the left great toe but states that it has been better since presentation.  She was diagnosed aspirin, Plavix and a statin however she only notes that she takes aspirin and no other blood thinners.  Past Medical History:  Diagnosis Date   Breast cancer, stage 0, right    pt currently taking Tamoxifen   Diabetes mellitus without complication (HCC)    Family history of breast cancer    Family history of prostate cancer    Family history of skin cancer    Hep C w/o coma, chronic (HCC)    Hx of hepatitis C    pt received treatment for Hep C in 2018   Peripheral arterial disease (HCC)    Sciatic pain     Past Surgical History:  Procedure Laterality Date   ABDOMINAL AORTOGRAM W/LOWER EXTREMITY N/A 03/04/2023   Procedure: ABDOMINAL AORTOGRAM W/LOWER EXTREMITY;  Surgeon: Maeola Harman, MD;  Location: [facility] INVASIVE CV LAB;  Service: Cardiovascular;  Laterality: N/A;   BREAST BIOPSY Right 09/12/2017   ENDARTERECTOMY FEMORAL Left 03/05/2023   Procedure: ILIOFEMORAL ENDARTERECTOMY;  Surgeon: Victorino Sparrow, MD;  Location: [facility] OR;  Service: Vascular;  Laterality: Left;   FEMORAL-POPLITEAL BYPASS GRAFT Left 03/05/2023   Procedure: LEFT FEMORAL-ABOVE KNEE POPLITEAL ARTERY BYPASS GRAFT USING PROPATEN RING GRAFT X 80CM;  Surgeon:  Victorino Sparrow, MD;  Location: [facility] OR;  Service: Vascular;  Laterality: Left;   INCISION AND DRAINAGE ABSCESS Left    hand    No Known Allergies  Prior to Admission medications   Medication Sig Start Date End Date Taking? Authorizing Provider  aspirin EC 81 MG tablet Take 1 tablet (81 mg total) by mouth daily. Swallow whole. 03/08/23   Glade Lloyd, MD  atorvastatin (LIPITOR) 40 MG tablet Take 1 tablet (40 mg total) by mouth daily. 03/08/23   Glade Lloyd, MD  Buprenorphine HCl-Naloxone HCl (SUBOXONE) 12-3 MG FILM Place 1 Film under the tongue 2 (two) times daily.     [provider]  clopidogrel (PLAVIX) 75 MG tablet Take 1 tablet (75 mg total) by mouth daily at 6 (six) AM. 03/08/23   Glade Lloyd, MD  gabapentin (NEURONTIN) 100 MG capsule Take 100 mg by mouth 3 (three) times daily. 11/22/20   [provider]  glipiZIDE (GLUCOTROL XL) 5 MG 24 hr tablet Take 1 tablet (5 mg total) by mouth daily with breakfast. 11/06/18   Serena Croissant, MD  HYDROcodone-acetaminophen (NORCO/VICODIN) 5-325 MG tablet Take 1 tablet by mouth every 6 (six) hours as needed for moderate pain. 03/07/23   Emilie Rutter, PA-C  levothyroxine (SYNTHROID) 100 MCG tablet Take 1 tablet (100 mcg total) by mouth daily before breakfast. 11/15/20   Serena Croissant, MD  losartan (COZAAR) 50 MG tablet Take 0.5 tablets (25 mg total)  by mouth daily. 03/07/23   Glade Lloyd, MD  metFORMIN (GLUCOPHAGE) 500 MG tablet Take 1 tablet (500 mg total) by mouth 2 (two) times daily with a meal. 03/07/23   Glade Lloyd, MD  mupirocin ointment (BACTROBAN) 2 % Apply 1 Application topically daily. 03/28/23   Edwin Cap, DPM  naloxone [facility]) nasal spray 4 mg/0.1 mL SMARTSIG:Both Nares 11/08/20   [provider]  venlafaxine XR (EFFEXOR-XR) 37.5 MG 24 hr capsule Take by mouth. 11/11/18   [provider]    Social History   Socioeconomic History   Marital status: Widowed    Spouse name: Not on file    Number of children: Not on file   Years of education: Not on file   Highest education level: Not on file  Occupational History   Not on file  Tobacco Use   Smoking status: Every Day    Current packs/day: 0.33    Average packs/day: 0.3 packs/day for 35.0 years (11.6 ttl pk-yrs)    Types: Cigarettes   Smokeless tobacco: Never  Vaping Use   Vaping status: Some Days   Substances: Nicotine  Substance and Sexual Activity   Alcohol use: Yes    Comment: Occasional   Drug use: Not Currently    Comment: history substance abuse    Sexual activity: Yes    Birth control/protection: None  Other Topics Concern   Not on file  Social History Narrative   Not on file   Social Drivers of Health   Financial Resource Strain: Low Risk  (04/19/2023)   Received from [facility]   Overall Financial Resource Strain (CARDIA)    Difficulty of Paying Living Expenses: Not hard at all  Food Insecurity: No Food Insecurity (04/19/2023)   Received from [facility]   Hunger Vital Sign    Worried About Running Out of Food in the Last Year: Never true    Ran Out of Food in the Last Year: Never true  Transportation Needs: No Transportation Needs (04/19/2023)   Received from [facility] - Transportation    Lack of Transportation (Medical): No    Lack of Transportation (Non-Medical): No  Physical Activity: Insufficiently Active (04/19/2023)   Received from [facility]   Exercise Vital Sign    Days of Exercise per Week: 5 days    Minutes of Exercise per Session: 20 min  Stress: No Stress Concern Present (04/19/2023)   Received from [facility] - Occupational Stress Questionnaire    Feeling of Stress : Not at all  Social Connections: Moderately Integrated (04/19/2023)   Received from [facility]   Social Connection and Isolation Panel [NHANES]    Frequency of Communication with Friends and Family: More than three times a week     Frequency of Social Gatherings with Friends and Family: Three times a week    Attends Religious Services: More than 4 times per year    Active Member of Clubs or Organizations: No    Attends Banker Meetings: 1 to 4 times per year    Marital Status: Widowed  Intimate Partner Violence: Not At Risk (04/19/2023)   Received from [facility]   Humiliation, Afraid, Rape, and Kick questionnaire    Fear of Current or Ex-Partner: No    Emotionally Abused: No    Physically Abused: No    Sexually Abused: No     Family History  Problem Relation Age of Onset   Diabetes Sister    Hypertension Sister    Breast cancer Sister 57   Diabetes Brother    Breast cancer Maternal Grandmother 30   Cervical cancer Mother    Prostate cancer Father 60       metastatic to bones   Cancer Maternal Aunt        type unk dx <50   Skin cancer Paternal Uncle    Pulmonary embolism Maternal Grandfather 66   Cancer Paternal Uncle    Breast cancer Other    Stomach cancer Other     Review of Systems  Constitutional: Negative.   HENT: Negative.    Respiratory: Negative.    Cardiovascular: Negative.   Musculoskeletal:        Left foot pain  Skin: Negative.   Neurological: Negative.   Endo/Heme/Allergies: Negative.   Psychiatric/Behavioral: Negative.        Physical Examination  Vitals:   10/01/23 1053 10/01/23 1441  BP: (!) 173/91 (!) 165/72  Pulse: (!) 105 98  Resp: 20 18  Temp: 98.2 F (36.8 C) (!) 97 F (36.1 C)  SpO2: 99% 100%   Body mass index is 18.55 kg/m.  Physical Exam HENT:     Nose: Nose normal.  Eyes:     Pupils: Pupils are equal, round, and reactive to light.  Cardiovascular:     Pulses:          Femoral pulses are 2+ on the right side and 1+ on the left side. Abdominal:     General: Abdomen is flat.     Palpations: Abdomen is soft.  Skin:    Capillary Refill: Capillary refill takes more than 3 seconds.     Comments: Left foot pictured below   Neurological:     General: No focal deficit present.     Mental Status: She is alert.  Psychiatric:        Mood and Affect: Mood normal.       CBC    Component Value Date/Time   WBC 9.2 10/01/2023 1345   RBC 4.59 10/01/2023 1345   HGB 13.0 10/01/2023 1345   HCT 40.3 10/01/2023 1345   PLT 222 10/01/2023 1345   MCV 87.8 10/01/2023 1345   MCH 28.3 10/01/2023 1345   MCHC 32.3 10/01/2023 1345   RDW 13.5 10/01/2023 1345   LYMPHSABS 3.3 02/26/2023 1110   MONOABS 1.3 (H) 02/26/2023 1110   EOSABS 0.3 02/26/2023 1110   BASOSABS 0.1 02/26/2023 1110    BMET    Component Value Date/Time   NA 136 10/01/2023 1345   K 4.0 10/01/2023 1345   CL 99 10/01/2023 1345   CO2 27 10/01/2023 1345   GLUCOSE 198 (H) 10/01/2023 1345   BUN 24 (H) 10/01/2023 1345   CREATININE 0.85 10/01/2023 1345   CREATININE 0.71 03/09/2014 0916   CALCIUM 9.1 10/01/2023 1345   GFRNONAA >60 10/01/2023 1345   GFRNONAA >89 03/09/2014 0916   GFRAA >89 03/09/2014 0916    COAGS: No results found for: "INR", "PROTIME"   Non-Invasive Vascular Imaging:   No new studies  ASSESSMENT/PLAN: This is a 63 y.o. 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  She has a known left common femoral to below-knee popliteal artery bypass.  We discussed proceeding with repeat angiography given a limb threatening nature of her disease process and she demonstrates good understanding.  Patient states that she cannot stay in the hospital today  and as such I will have tried to her to arrange angiogram next week.  She can continue aspirin and Plavix periprocedurally and she demonstrates good understanding of the risk and benefits.  The proper discharged and the office will contact her.  Jasmeet Manton C. Randie Heinz, MD [facility] [place] Office: [phone] Pager: [phone]

## 2023-10-07 ENCOUNTER — Ambulatory Visit (HOSPITAL_BASED_OUTPATIENT_CLINIC_OR_DEPARTMENT_OTHER)

## 2023-10-07 ENCOUNTER — Encounter (HOSPITAL_COMMUNITY)
Admission: RE | Disposition: A | Discharge status: Home / Self Care | Payer: Self-pay | Source: Home / Self Care | Attending: Vascular Surgery

## 2023-10-07 ENCOUNTER — Ambulatory Visit (HOSPITAL_COMMUNITY)
Admission: RE | Admit: 2023-10-07 | Discharge: 2023-10-07 | Disposition: A | Discharge status: Home / Self Care | Attending: Vascular Surgery | Admitting: Vascular Surgery

## 2023-10-07 ENCOUNTER — Other Ambulatory Visit: Payer: Self-pay

## 2023-10-07 ENCOUNTER — Encounter (HOSPITAL_COMMUNITY): Payer: Self-pay | Admitting: Vascular Surgery

## 2023-10-07 DIAGNOSIS — E1151 Type 2 diabetes mellitus with diabetic peripheral angiopathy without gangrene: Secondary | ICD-10-CM | POA: Diagnosis present

## 2023-10-07 DIAGNOSIS — Z7902 Long term (current) use of antithrombotics/antiplatelets: Secondary | ICD-10-CM | POA: Insufficient documentation

## 2023-10-07 DIAGNOSIS — Z7982 Long term (current) use of aspirin: Secondary | ICD-10-CM | POA: Insufficient documentation

## 2023-10-07 DIAGNOSIS — I70201 Unspecified atherosclerosis of native arteries of extremities, right leg: Secondary | ICD-10-CM

## 2023-10-07 DIAGNOSIS — F17210 Nicotine dependence, cigarettes, uncomplicated: Secondary | ICD-10-CM | POA: Diagnosis not present

## 2023-10-07 DIAGNOSIS — I7090 Unspecified atherosclerosis: Secondary | ICD-10-CM | POA: Diagnosis not present

## 2023-10-07 DIAGNOSIS — Z9889 Other specified postprocedural states: Secondary | ICD-10-CM | POA: Diagnosis not present

## 2023-10-07 DIAGNOSIS — I70245 Atherosclerosis of native arteries of left leg with ulceration of other part of foot: Secondary | ICD-10-CM | POA: Insufficient documentation

## 2023-10-07 DIAGNOSIS — Z7984 Long term (current) use of oral hypoglycemic drugs: Secondary | ICD-10-CM | POA: Diagnosis not present

## 2023-10-07 DIAGNOSIS — L97529 Non-pressure chronic ulcer of other part of left foot with unspecified severity: Secondary | ICD-10-CM | POA: Insufficient documentation

## 2023-10-07 DIAGNOSIS — I70222 Atherosclerosis of native arteries of extremities with rest pain, left leg: Secondary | ICD-10-CM

## 2023-10-07 DIAGNOSIS — Z79899 Other long term (current) drug therapy: Secondary | ICD-10-CM | POA: Insufficient documentation

## 2023-10-07 DIAGNOSIS — E11621 Type 2 diabetes mellitus with foot ulcer: Secondary | ICD-10-CM | POA: Diagnosis not present

## 2023-10-07 HISTORY — PX: ABDOMINAL AORTOGRAM: CATH118222

## 2023-10-07 HISTORY — PX: LOWER EXTREMITY ANGIOGRAPHY: CATH118251

## 2023-10-07 LAB — POCT I-STAT, CHEM 8
BUN: 22 mg/dL (ref 8–23)
Calcium, Ion: 1.15 mmol/L (ref 1.15–1.40)
Chloride: 103 mmol/L (ref 98–111)
Creatinine, Ser: 0.7 mg/dL (ref 0.44–1.00)
Glucose, Bld: 181 mg/dL — ABNORMAL HIGH (ref 70–99)
HCT: 37 % (ref 36.0–46.0)
Hemoglobin: 12.6 g/dL (ref 12.0–15.0)
Potassium: 4.5 mmol/L (ref 3.5–5.1)
Sodium: 137 mmol/L (ref 135–145)
TCO2: 30 mmol/L (ref 22–32)

## 2023-10-07 LAB — GLUCOSE, CAPILLARY: Glucose-Capillary: 112 mg/dL — ABNORMAL HIGH (ref 70–99)

## 2023-10-07 SURGERY — ABDOMINAL AORTOGRAM
Anesthesia: LOCAL

## 2023-10-07 MED ORDER — IODIXANOL 320 MG/ML IV SOLN
INTRAVENOUS | Status: DC | PRN
  Administered 2023-10-07: 80 mL

## 2023-10-07 MED ORDER — ONDANSETRON HCL 4 MG/2ML IJ SOLN: 4.0000 mg | Freq: Four times a day (QID) | INTRAMUSCULAR | Status: DC | PRN

## 2023-10-07 MED ORDER — LIDOCAINE HCL (PF) 1 % IJ SOLN
INTRAMUSCULAR | Status: DC | PRN
  Administered 2023-10-07: 15 mL

## 2023-10-07 MED ORDER — LABETALOL HCL 5 MG/ML IV SOLN: 10.0000 mg | INTRAVENOUS | Status: DC | PRN

## 2023-10-07 MED ORDER — FENTANYL CITRATE (PF) 100 MCG/2ML IJ SOLN
INTRAMUSCULAR | Status: DC | PRN
  Administered 2023-10-07: 50 ug via INTRAVENOUS

## 2023-10-07 MED ORDER — MIDAZOLAM HCL 2 MG/2ML IJ SOLN
INTRAMUSCULAR | Status: DC | PRN
  Administered 2023-10-07: 1 mg via INTRAVENOUS

## 2023-10-07 MED ORDER — SODIUM CHLORIDE 0.9 % WEIGHT BASED INFUSION
1.0000 mL/kg/h | INTRAVENOUS | Status: DC
  Administered 2023-10-07: 1 mL/kg/h via INTRAVENOUS

## 2023-10-07 MED ORDER — MIDAZOLAM HCL 2 MG/2ML IJ SOLN
INTRAMUSCULAR | Status: AC
  Filled 2023-10-07: qty 2

## 2023-10-07 MED ORDER — HYDRALAZINE HCL 20 MG/ML IJ SOLN: 5.0000 mg | INTRAMUSCULAR | Status: DC | PRN

## 2023-10-07 MED ORDER — MORPHINE SULFATE (PF) 2 MG/ML IV SOLN
2.0000 mg | INTRAVENOUS | Status: DC | PRN
  Administered 2023-10-07: 2 mg via INTRAVENOUS

## 2023-10-07 MED ORDER — HEPARIN SODIUM (PORCINE) 1000 UNIT/ML IJ SOLN
INTRAMUSCULAR | Status: AC
  Filled 2023-10-07: qty 10

## 2023-10-07 MED ORDER — SODIUM CHLORIDE 0.9 % IV SOLN: 250.0000 mL | INTRAVENOUS | Status: DC | PRN

## 2023-10-07 MED ORDER — SODIUM CHLORIDE 0.9% FLUSH: 3.0000 mL | Freq: Two times a day (BID) | INTRAVENOUS | Status: DC

## 2023-10-07 MED ORDER — MORPHINE SULFATE (PF) 2 MG/ML IV SOLN
INTRAVENOUS | Status: AC
  Filled 2023-10-07: qty 1

## 2023-10-07 MED ORDER — LIDOCAINE HCL (PF) 1 % IJ SOLN
INTRAMUSCULAR | Status: AC
  Filled 2023-10-07: qty 30

## 2023-10-07 MED ORDER — SODIUM CHLORIDE 0.9% FLUSH: 3.0000 mL | INTRAVENOUS | Status: DC | PRN

## 2023-10-07 MED ORDER — ACETAMINOPHEN 325 MG PO TABS: 650.0000 mg | ORAL_TABLET | ORAL | Status: DC | PRN

## 2023-10-07 MED ORDER — HEPARIN (PORCINE) IN NACL 1000-0.9 UT/500ML-% IV SOLN
INTRAVENOUS | Status: DC | PRN
  Administered 2023-10-07: 1000 mL

## 2023-10-07 MED ORDER — FENTANYL CITRATE (PF) 100 MCG/2ML IJ SOLN
INTRAMUSCULAR | Status: AC
  Filled 2023-10-07: qty 2

## 2023-10-07 MED ORDER — SODIUM CHLORIDE 0.9 % IV SOLN: INTRAVENOUS | Status: DC

## 2023-10-07 MED ORDER — OXYCODONE HCL 5 MG PO TABS: 5.0000 mg | ORAL_TABLET | ORAL | Status: DC | PRN

## 2023-10-07 SURGICAL SUPPLY — 8 items
CATH OMNI FLUSH 5F 65CM (CATHETERS) IMPLANT
COVER DOME SNAP 22 D (MISCELLANEOUS) IMPLANT
KIT MICROPUNCTURE NIT STIFF (SHEATH) IMPLANT
SET ATX-X65L (MISCELLANEOUS) IMPLANT
SHEATH PINNACLE 5F 10CM (SHEATH) IMPLANT
SHEATH PROBE COVER 6X72 (BAG) IMPLANT
TRAY PV CATH (CUSTOM PROCEDURE TRAY) ×1 IMPLANT
WIRE BENTSON .035X145CM (WIRE) IMPLANT

## 2023-10-07 NOTE — Progress Notes (Signed)
 LLE vein mapping has been completed.   Results can be found under chart review under CV PROC. 10/07/2023 3:11 PM Mariyah Upshaw RVT, RDMS

## 2023-10-07 NOTE — Op Note (Signed)
    Patient name: Kashina Mecum MRN: [id number] DOB: 1961/03/06 Sex: female  10/07/2023 Pre-operative Diagnosis: Atherosclerosis native arteries with left lower extremity ulceration and occluded left lower extremity bypass graft Post-operative diagnosis:  Same Surgeon:  Luanna Salk. Randie Heinz, MD Procedure Performed: 1.  Percutaneous ultrasound-guided access right common femoral artery 2.  Catheter in aorta and aortogram with bilateral lower extremity angiography 3.  Moderate sedation with fentanyl and Versed for 18 minutes  Indications: 63 year old female with history of left lower extremity chronic limb threatening ischemia status post common femoral endarterectomy with bypass which subsequently healed her wounds.  She now has recurrent pain in the left lower extremity with toe ulceration and a known occluded bypass graft.  She is indicated for angiography with possible intervention.  Findings: The aorta and iliac segments are free of flow-limiting stenosis.  On the right lower extremity there is a flush occlusion of the SFA with runoff via the profunda which reconstitutes above the knee popliteal artery with three-vessel runoff to the foot.  On the left side the common femoral artery is occluded which reconstitutes to large branches of the profunda the SFA and bypass graft are not visualized.  She reconstitutes distal to the previous bypass graft above the knee with three-vessel runoff to the foot.  Patient will require left common femoral endarterectomy with repeat bypass to either the above-knee or below-knee popliteal arteries.   Procedure:  The patient was identified in the holding area and taken to room 8.  The patient was then placed supine on the table and prepped and draped in the usual sterile fashion.  A time out was called.  Ultrasound was used to evaluate the right common femoral artery.  This was diminutive but patent.  The area was anesthetized 1% lidocaine and cannulated with  micropuncture needle followed by wire and a sheath.  An ultrasound image was saved the permanent record.  Concomitantly we administered fentanyl and Versed as moderate sedation her vital signs were monitored throughout the case by bedside nursing.  We placed a Bentson wire followed by 5 [place] sheath and around the catheter to the level of L1 and performed aortogram and then pulled this down to the bifurcation performed bilateral ower extremity angiography.  Catheter was removed over a wire and sheath will be pulled in postoperative holding.  With the above findings we will plan for left lower extremity common femoral endarterectomy and repeat bypass.  Contrast: 80 cc    Asmar Brozek C. Randie Heinz, MD [facility] [place] Office: [phone] Pager: [phone]

## 2023-10-07 NOTE — Discharge Instructions (Signed)
NO METFORMIN FOR 2 DAYS 

## 2023-10-07 NOTE — Progress Notes (Signed)
 Site area: 33F femoral arterial sheath Site Prior to Removal:  Level 0 Pressure Applied For: 25 minutes Manual:   yes Patient Status During Pull:  stable Post Pull Site:  Level 0 Post Pull Instructions Given:  yes Post Pull Pulses Present: rt dp and pt pulses dopplered Dressing Applied:  gauze and tegaderm Bedrest begins @ 1105 Comments:

## 2023-10-07 NOTE — Interval H&P Note (Signed)
 History and Physical Interval Note:  10/07/2023 8:39 AM  Martha Lozano  has presented today for surgery, with the diagnosis of atherosclerosis with ulceration.  The various methods of treatment have been discussed with the patient and family. After consideration of risks, benefits and other options for treatment, the patient has consented to  Procedure(s): ABDOMINAL AORTOGRAM (N/A) Lower Extremity Angiography (N/A) as a surgical intervention.  The patient's history has been reviewed, patient examined, no change in status, stable for surgery.  I have reviewed the patient's chart and labs.  Questions were answered to the patient's satisfaction.     Lemar Livings

## 2023-10-07 NOTE — Progress Notes (Signed)
 Up and walked and tolerated well; right groin stable, no bleeding or hematoma

## 2023-10-08 ENCOUNTER — Telehealth: Payer: Self-pay

## 2023-10-08 NOTE — Telephone Encounter (Signed)
 Attempted to call for surgery scheduling. LVM

## 2023-10-18 NOTE — Progress Notes (Signed)
 INSTRUCTIONS ONLY  Blood Thinner Instructions: Last dose of Plavix 4/1 Aspirin Instructions: Continue  Anesthesia review: N  Patient verbally denies any shortness of breath, fever, cough and chest pain during phone call   -------------  SDW INSTRUCTIONS given:  Your procedure is scheduled on Monday, April 7th.  Report to [facility] Main Entrance "A" at 0530 A.M., and check in at the Admitting office.  Call this number if you have problems the morning of surgery:  [phone]   Remember:  Do not eat or drink after midnight the night before your surgery    Take these medicines the morning of surgery with A SIP OF WATER  aspirin  gabapentin (NEURONTIN)  HYDROcodone-acetaminophen (NORCO/VICODIN)  levothyroxine (SYNTHROID)  venlafaxine XR (EFFEXOR-XR)   **ONLY TAKE 1/2 of your NORMAL dose of insulin glargine (LANTUS) the night before your surgery**  **DO NOT TAKE** your glipiZIDE (GLUCOTROL XL) nor metFORMIN (GLUCOPHAGE) the day of surgery  ** PLEASE check your blood sugar the morning of your surgery when you wake up and every 2 hours until you get to the Short Stay unit.  If your blood sugar is less than 70 mg/dL, you will need to treat for low blood sugar: Do not take insulin. Treat a low blood sugar (less than 70 mg/dL) with  cup of clear juice (cranberry or apple), 4 glucose tablets, OR glucose gel. Recheck blood sugar in 15 minutes after treatment (to make sure it is greater than 70 mg/dL). If your blood sugar is not greater than 70 mg/dL on recheck, call [phone] for further instructions.   As of today, STOP taking any Aleve, Naproxen, Ibuprofen, Motrin, Advil, Goody's, BC's, all herbal medications, fish oil, and all vitamins.                      Do not wear jewelry, make up, or nail polish            Do not wear lotions, powders, perfumes/colognes, or deodorant.            Do not shave 48 hours prior to surgery.  Men may shave face and neck.            Do not  bring valuables to the hospital.            [facility] is not responsible for any belongings or valuables.  Do NOT Smoke (Tobacco/Vaping) 24 hours prior to your procedure If you use a CPAP at night, you may bring all equipment for your overnight stay.   Contacts, glasses, dentures or bridgework may not be worn into surgery.      For patients admitted to the hospital, discharge time will be determined by your treatment team.   Patients discharged the day of surgery will not be allowed to drive home, and someone needs to stay with them for 24 hours.    Special instructions:   [place]- Preparing For Surgery  Before surgery, you can play an important role. Because skin is not sterile, your skin needs to be as free of germs as possible. You can reduce the number of germs on your skin by washing with CHG (chlorahexidine gluconate) Soap before surgery.  CHG is an antiseptic cleaner which kills germs and bonds with the skin to continue killing germs even after washing.    Oral Hygiene is also important to reduce your risk of infection.  Remember - BRUSH YOUR TEETH THE MORNING OF SURGERY WITH YOUR REGULAR TOOTHPASTE  Please  do not use if you have an allergy to CHG or antibacterial soaps. If your skin becomes reddened/irritated stop using the CHG.  Do not shave (including legs and underarms) for at least 48 hours prior to first CHG shower. It is OK to shave your face.  Please follow these instructions carefully.   Shower the NIGHT BEFORE SURGERY and the MORNING OF SURGERY with DIAL Soap.   Pat yourself dry with a CLEAN TOWEL.  Wear CLEAN PAJAMAS to bed the night before surgery  Place CLEAN SHEETS on your bed the night of your first shower and DO NOT SLEEP WITH PETS.   Day of Surgery: Please shower morning of surgery  Wear Clean/Comfortable clothing the morning of surgery Do not apply any deodorants/lotions.   Remember to brush your teeth WITH YOUR REGULAR TOOTHPASTE.   Questions  were answered. Patient verbalized understanding of instructions.

## 2023-10-21 ENCOUNTER — Inpatient Hospital Stay (HOSPITAL_COMMUNITY): Admitting: Certified Registered Nurse Anesthetist

## 2023-10-21 ENCOUNTER — Other Ambulatory Visit: Payer: Self-pay

## 2023-10-21 ENCOUNTER — Encounter (HOSPITAL_COMMUNITY): Payer: Self-pay | Admitting: Vascular Surgery

## 2023-10-21 ENCOUNTER — Inpatient Hospital Stay (HOSPITAL_COMMUNITY)
Admission: RE | Admit: 2023-10-21 | Discharge: 2023-10-23 | DRG: 271 | Disposition: A | Discharge status: Home / Self Care | Attending: Vascular Surgery | Admitting: Vascular Surgery

## 2023-10-21 ENCOUNTER — Encounter (HOSPITAL_COMMUNITY)
Admission: RE | Disposition: A | Discharge status: Home / Self Care | Payer: Self-pay | Source: Home / Self Care | Attending: Vascular Surgery

## 2023-10-21 DIAGNOSIS — F17290 Nicotine dependence, other tobacco product, uncomplicated: Secondary | ICD-10-CM | POA: Diagnosis present

## 2023-10-21 DIAGNOSIS — M79675 Pain in left toe(s): Secondary | ICD-10-CM | POA: Diagnosis present

## 2023-10-21 DIAGNOSIS — Z8249 Family history of ischemic heart disease and other diseases of the circulatory system: Secondary | ICD-10-CM | POA: Diagnosis not present

## 2023-10-21 DIAGNOSIS — Z803 Family history of malignant neoplasm of breast: Secondary | ICD-10-CM

## 2023-10-21 DIAGNOSIS — Z7982 Long term (current) use of aspirin: Secondary | ICD-10-CM | POA: Diagnosis not present

## 2023-10-21 DIAGNOSIS — Z833 Family history of diabetes mellitus: Secondary | ICD-10-CM

## 2023-10-21 DIAGNOSIS — I70222 Atherosclerosis of native arteries of extremities with rest pain, left leg: Secondary | ICD-10-CM

## 2023-10-21 DIAGNOSIS — Z79899 Other long term (current) drug therapy: Secondary | ICD-10-CM

## 2023-10-21 DIAGNOSIS — Z7984 Long term (current) use of oral hypoglycemic drugs: Secondary | ICD-10-CM

## 2023-10-21 DIAGNOSIS — Z800 Family history of malignant neoplasm of digestive organs: Secondary | ICD-10-CM

## 2023-10-21 DIAGNOSIS — T82898A Other specified complication of vascular prosthetic devices, implants and grafts, initial encounter: Secondary | ICD-10-CM | POA: Diagnosis present

## 2023-10-21 DIAGNOSIS — I70299 Other atherosclerosis of native arteries of extremities, unspecified extremity: Secondary | ICD-10-CM | POA: Diagnosis present

## 2023-10-21 DIAGNOSIS — F17210 Nicotine dependence, cigarettes, uncomplicated: Secondary | ICD-10-CM | POA: Diagnosis present

## 2023-10-21 DIAGNOSIS — Z808 Family history of malignant neoplasm of other organs or systems: Secondary | ICD-10-CM

## 2023-10-21 DIAGNOSIS — Z7902 Long term (current) use of antithrombotics/antiplatelets: Secondary | ICD-10-CM | POA: Diagnosis not present

## 2023-10-21 DIAGNOSIS — Z853 Personal history of malignant neoplasm of breast: Secondary | ICD-10-CM | POA: Diagnosis not present

## 2023-10-21 DIAGNOSIS — Z8042 Family history of malignant neoplasm of prostate: Secondary | ICD-10-CM

## 2023-10-21 DIAGNOSIS — I82512 Chronic embolism and thrombosis of left femoral vein: Secondary | ICD-10-CM | POA: Diagnosis present

## 2023-10-21 DIAGNOSIS — Z794 Long term (current) use of insulin: Secondary | ICD-10-CM

## 2023-10-21 DIAGNOSIS — E1151 Type 2 diabetes mellitus with diabetic peripheral angiopathy without gangrene: Secondary | ICD-10-CM | POA: Diagnosis present

## 2023-10-21 DIAGNOSIS — B182 Chronic viral hepatitis C: Secondary | ICD-10-CM | POA: Diagnosis present

## 2023-10-21 DIAGNOSIS — Z79890 Hormone replacement therapy (postmenopausal): Secondary | ICD-10-CM | POA: Diagnosis not present

## 2023-10-21 DIAGNOSIS — L97909 Non-pressure chronic ulcer of unspecified part of unspecified lower leg with unspecified severity: Secondary | ICD-10-CM | POA: Diagnosis present

## 2023-10-21 DIAGNOSIS — I10 Essential (primary) hypertension: Secondary | ICD-10-CM | POA: Diagnosis not present

## 2023-10-21 DIAGNOSIS — M79672 Pain in left foot: Secondary | ICD-10-CM | POA: Diagnosis present

## 2023-10-21 DIAGNOSIS — Z8049 Family history of malignant neoplasm of other genital organs: Secondary | ICD-10-CM

## 2023-10-21 DIAGNOSIS — E119 Type 2 diabetes mellitus without complications: Secondary | ICD-10-CM

## 2023-10-21 HISTORY — PX: ENDARTERECTOMY FEMORAL: SHX5804

## 2023-10-21 HISTORY — PX: FEMORAL-POPLITEAL BYPASS GRAFT: SHX937

## 2023-10-21 LAB — GLUCOSE, CAPILLARY
Glucose-Capillary: 137 mg/dL — ABNORMAL HIGH (ref 70–99)
Glucose-Capillary: 197 mg/dL — ABNORMAL HIGH (ref 70–99)
Glucose-Capillary: 182 mg/dL — ABNORMAL HIGH (ref 70–99)
Glucose-Capillary: 90 mg/dL (ref 70–99)
Glucose-Capillary: 65 mg/dL — ABNORMAL LOW (ref 70–99)
Glucose-Capillary: 146 mg/dL — ABNORMAL HIGH (ref 70–99)
Glucose-Capillary: 180 mg/dL — ABNORMAL HIGH (ref 70–99)

## 2023-10-21 LAB — URINALYSIS, ROUTINE W REFLEX MICROSCOPIC
Bacteria, UA: NONE SEEN
Bilirubin Urine: NEGATIVE
Glucose, UA: NEGATIVE mg/dL
Hgb urine dipstick: NEGATIVE
Ketones, ur: NEGATIVE mg/dL
Nitrite: NEGATIVE
Protein, ur: NEGATIVE mg/dL
Specific Gravity, Urine: 1.023 (ref 1.005–1.030)
pH: 5 (ref 5.0–8.0)

## 2023-10-21 LAB — TYPE AND SCREEN
ABO/RH(D): A POS
Antibody Screen: NEGATIVE

## 2023-10-21 LAB — CBC
HCT: 41.9 % (ref 36.0–46.0)
HCT: 27.9 % — ABNORMAL LOW (ref 36.0–46.0)
Hemoglobin: 13.5 g/dL (ref 12.0–15.0)
Hemoglobin: 9 g/dL — ABNORMAL LOW (ref 12.0–15.0)
MCH: 28.3 pg (ref 26.0–34.0)
MCH: 28.8 pg (ref 26.0–34.0)
MCHC: 32.2 g/dL (ref 30.0–36.0)
MCHC: 32.3 g/dL (ref 30.0–36.0)
MCV: 87.8 fL (ref 80.0–100.0)
MCV: 89.1 fL (ref 80.0–100.0)
Platelets: 216 10*3/uL (ref 150–400)
Platelets: 166 10*3/uL (ref 150–400)
RBC: 4.77 MIL/uL (ref 3.87–5.11)
RBC: 3.13 MIL/uL — ABNORMAL LOW (ref 3.87–5.11)
RDW: 13.2 % (ref 11.5–15.5)
RDW: 13.3 % (ref 11.5–15.5)
WBC: 7.6 10*3/uL (ref 4.0–10.5)
WBC: 6.9 10*3/uL (ref 4.0–10.5)
nRBC: 0 % (ref 0.0–0.2)
nRBC: 0 % (ref 0.0–0.2)

## 2023-10-21 LAB — POCT I-STAT 7, (LYTES, BLD GAS, ICA,H+H)
Acid-base deficit: 3 mmol/L — ABNORMAL HIGH (ref 0.0–2.0)
Bicarbonate: 21.4 mmol/L (ref 20.0–28.0)
Calcium, Ion: 1.08 mmol/L — ABNORMAL LOW (ref 1.15–1.40)
HCT: 29 % — ABNORMAL LOW (ref 36.0–46.0)
Hemoglobin: 9.9 g/dL — ABNORMAL LOW (ref 12.0–15.0)
O2 Saturation: 100 %
Potassium: 3 mmol/L — ABNORMAL LOW (ref 3.5–5.1)
Sodium: 139 mmol/L (ref 135–145)
TCO2: 22 mmol/L (ref 22–32)
pCO2 arterial: 33.1 mmHg (ref 32–48)
pH, Arterial: 7.42 (ref 7.35–7.45)
pO2, Arterial: 347 mmHg — ABNORMAL HIGH (ref 83–108)

## 2023-10-21 LAB — HEMOGLOBIN A1C
Hgb A1c MFr Bld: 7.8 % — ABNORMAL HIGH (ref 4.8–5.6)
Mean Plasma Glucose: 177.16 mg/dL

## 2023-10-21 LAB — POCT ACTIVATED CLOTTING TIME
Activated Clotting Time: 222 s
Activated Clotting Time: 262 s
Activated Clotting Time: 222 s
Activated Clotting Time: 251 s
Activated Clotting Time: 141 s
Activated Clotting Time: 222 s

## 2023-10-21 LAB — SURGICAL PCR SCREEN
MRSA, PCR: NEGATIVE
Staphylococcus aureus: POSITIVE — AB

## 2023-10-21 LAB — COMPREHENSIVE METABOLIC PANEL WITH GFR
ALT: 19 U/L (ref 0–44)
AST: 21 U/L (ref 15–41)
Albumin: 3.6 g/dL (ref 3.5–5.0)
Alkaline Phosphatase: 87 U/L (ref 38–126)
Anion gap: 10 (ref 5–15)
BUN: 15 mg/dL (ref 8–23)
CO2: 29 mmol/L (ref 22–32)
Calcium: 9.5 mg/dL (ref 8.9–10.3)
Chloride: 99 mmol/L (ref 98–111)
Creatinine, Ser: 0.7 mg/dL (ref 0.44–1.00)
GFR, Estimated: >60 mL/min (ref >60)
Glucose, Bld: 130 mg/dL — ABNORMAL HIGH (ref 70–99)
Potassium: 4.2 mmol/L (ref 3.5–5.1)
Sodium: 138 mmol/L (ref 135–145)
Total Bilirubin: 0.4 mg/dL (ref 0.0–1.2)
Total Protein: 7.6 g/dL (ref 6.5–8.1)

## 2023-10-21 LAB — CREATININE, SERUM
Creatinine, Ser: 0.71 mg/dL (ref 0.44–1.00)
GFR, Estimated: >60 mL/min (ref >60)

## 2023-10-21 LAB — APTT: aPTT: 24 s (ref 24–36)

## 2023-10-21 LAB — PROTIME-INR
INR: 1 (ref 0.8–1.2)
Prothrombin Time: 13.6 s (ref 11.4–15.2)

## 2023-10-21 SURGERY — ENDARTERECTOMY, FEMORAL
Anesthesia: General | Site: Leg Lower | Laterality: Left

## 2023-10-21 MED ORDER — HEPARIN SODIUM (PORCINE) 5000 UNIT/ML IJ SOLN
5000.0000 [IU] | Freq: Three times a day (TID) | INTRAMUSCULAR | Status: DC
  Administered 2023-10-22 – 2023-10-23 (×4): 5000 [IU] via SUBCUTANEOUS
  Filled 2023-10-21 (×4): qty 1

## 2023-10-21 MED ORDER — SENNOSIDES-DOCUSATE SODIUM 8.6-50 MG PO TABS: 1.0000 | ORAL_TABLET | Freq: Every evening | ORAL | Status: DC | PRN

## 2023-10-21 MED ORDER — POTASSIUM CHLORIDE CRYS ER 20 MEQ PO TBCR: 20.0000 meq | EXTENDED_RELEASE_TABLET | Freq: Every day | ORAL | Status: DC | PRN

## 2023-10-21 MED ORDER — HYDRALAZINE HCL 20 MG/ML IJ SOLN: 5.0000 mg | INTRAMUSCULAR | Status: DC | PRN

## 2023-10-21 MED ORDER — LABETALOL HCL 5 MG/ML IV SOLN
INTRAVENOUS | Status: DC | PRN
  Administered 2023-10-21: 5 mg via INTRAVENOUS

## 2023-10-21 MED ORDER — INSULIN ASPART 100 UNIT/ML IJ SOLN
0.0000 [IU] | Freq: Every day | INTRAMUSCULAR | Status: DC
  Administered 2023-10-22: 2 [IU] via SUBCUTANEOUS

## 2023-10-21 MED ORDER — LOSARTAN POTASSIUM 25 MG PO TABS
25.0000 mg | ORAL_TABLET | Freq: Every day | ORAL | Status: DC
  Administered 2023-10-21 – 2023-10-23 (×3): 25 mg via ORAL
  Filled 2023-10-21 (×3): qty 1

## 2023-10-21 MED ORDER — MAGNESIUM SULFATE 2 GM/50ML IV SOLN: 2.0000 g | Freq: Every day | INTRAVENOUS | Status: DC | PRN

## 2023-10-21 MED ORDER — ONDANSETRON HCL 4 MG/2ML IJ SOLN: 4.0000 mg | Freq: Four times a day (QID) | INTRAMUSCULAR | Status: DC | PRN

## 2023-10-21 MED ORDER — PROTAMINE SULFATE 10 MG/ML IV SOLN
INTRAVENOUS | Status: AC
  Filled 2023-10-21: qty 5

## 2023-10-21 MED ORDER — DOCUSATE SODIUM 100 MG PO CAPS
100.0000 mg | ORAL_CAPSULE | Freq: Every day | ORAL | Status: DC
  Administered 2023-10-22: 100 mg via ORAL
  Filled 2023-10-21: qty 1

## 2023-10-21 MED ORDER — ROCURONIUM BROMIDE 10 MG/ML (PF) SYRINGE
PREFILLED_SYRINGE | INTRAVENOUS | Status: DC | PRN
  Administered 2023-10-21 (×2): 50 mg via INTRAVENOUS

## 2023-10-21 MED ORDER — DEXAMETHASONE SODIUM PHOSPHATE 10 MG/ML IJ SOLN
INTRAMUSCULAR | Status: DC | PRN
Start: 2023-10-21 — End: 2023-10-21
  Administered 2023-10-21: 10 mg via INTRAVENOUS

## 2023-10-21 MED ORDER — LABETALOL HCL 5 MG/ML IV SOLN: 10.0000 mg | INTRAVENOUS | Status: DC | PRN

## 2023-10-21 MED ORDER — VENLAFAXINE HCL ER 37.5 MG PO CP24
37.5000 mg | ORAL_CAPSULE | ORAL | Status: DC
  Administered 2023-10-21 – 2023-10-22 (×2): 37.5 mg via ORAL
  Filled 2023-10-21 (×2): qty 1

## 2023-10-21 MED ORDER — METOPROLOL TARTRATE 5 MG/5ML IV SOLN: 2.0000 mg | INTRAVENOUS | Status: DC | PRN

## 2023-10-21 MED ORDER — SODIUM CHLORIDE 0.9 % IV SOLN: 500.0000 mL | Freq: Once | INTRAVENOUS | Status: DC | PRN

## 2023-10-21 MED ORDER — PROTAMINE SULFATE 10 MG/ML IV SOLN
INTRAVENOUS | Status: DC | PRN
  Administered 2023-10-21: 30 mg via INTRAVENOUS
  Administered 2023-10-21: 20 mg via INTRAVENOUS

## 2023-10-21 MED ORDER — ROCURONIUM BROMIDE 10 MG/ML (PF) SYRINGE
PREFILLED_SYRINGE | INTRAVENOUS | Status: AC
  Filled 2023-10-21: qty 10

## 2023-10-21 MED ORDER — CHLORHEXIDINE GLUCONATE CLOTH 2 % EX PADS: 6.0000 | MEDICATED_PAD | Freq: Once | CUTANEOUS | Status: DC

## 2023-10-21 MED ORDER — PHENYLEPHRINE HCL-NACL 20-0.9 MG/250ML-% IV SOLN
INTRAVENOUS | Status: DC | PRN
  Administered 2023-10-21: 10 ug/min via INTRAVENOUS

## 2023-10-21 MED ORDER — ONDANSETRON HCL 4 MG/2ML IJ SOLN
INTRAMUSCULAR | Status: AC
  Filled 2023-10-21: qty 2

## 2023-10-21 MED ORDER — CHLORHEXIDINE GLUCONATE 0.12 % MT SOLN
15.0000 mL | Freq: Once | OROMUCOSAL | Status: AC
  Administered 2023-10-21: 15 mL via OROMUCOSAL

## 2023-10-21 MED ORDER — HYDROMORPHONE HCL 1 MG/ML IJ SOLN
0.2500 mg | INTRAMUSCULAR | Status: DC | PRN
  Administered 2023-10-21 (×2): 0.5 mg via INTRAVENOUS

## 2023-10-21 MED ORDER — HYDROMORPHONE HCL 1 MG/ML IJ SOLN
INTRAMUSCULAR | Status: AC
Start: 2023-10-21 — End: 2023-10-21
  Filled 2023-10-21: qty 1

## 2023-10-21 MED ORDER — ASPIRIN 81 MG PO CHEW
81.0000 mg | CHEWABLE_TABLET | Freq: Once | ORAL | Status: AC
  Administered 2023-10-21: 81 mg via ORAL

## 2023-10-21 MED ORDER — ASPIRIN 81 MG PO TBEC
81.0000 mg | DELAYED_RELEASE_TABLET | Freq: Every day | ORAL | Status: DC
  Administered 2023-10-22 – 2023-10-23 (×2): 81 mg via ORAL
  Filled 2023-10-21 (×2): qty 1

## 2023-10-21 MED ORDER — HEMOSTATIC AGENTS (NO CHARGE) OPTIME
TOPICAL | Status: DC | PRN
  Administered 2023-10-21 (×3): 1 via TOPICAL

## 2023-10-21 MED ORDER — PROPOFOL 10 MG/ML IV BOLUS
INTRAVENOUS | Status: DC | PRN
  Administered 2023-10-21: 110 mg via INTRAVENOUS

## 2023-10-21 MED ORDER — FENTANYL CITRATE (PF) 250 MCG/5ML IJ SOLN
INTRAMUSCULAR | Status: AC
  Filled 2023-10-21: qty 5

## 2023-10-21 MED ORDER — ASPIRIN 81 MG PO CHEW
CHEWABLE_TABLET | ORAL | Status: AC
Start: 2023-10-21 — End: 2023-10-21
  Filled 2023-10-21: qty 1

## 2023-10-21 MED ORDER — LACTATED RINGERS IV SOLN: INTRAVENOUS | Status: DC | PRN

## 2023-10-21 MED ORDER — HEPARIN SODIUM (PORCINE) 1000 UNIT/ML IJ SOLN
INTRAMUSCULAR | Status: DC | PRN
  Administered 2023-10-21: 5000 [IU] via INTRAVENOUS
  Administered 2023-10-21 (×2): 3000 [IU] via INTRAVENOUS

## 2023-10-21 MED ORDER — ACETAMINOPHEN 325 MG PO TABS: 325.0000 mg | ORAL_TABLET | ORAL | Status: DC | PRN

## 2023-10-21 MED ORDER — ALUM & MAG HYDROXIDE-SIMETH 200-200-20 MG/5ML PO SUSP: 15.0000 mL | ORAL | Status: DC | PRN

## 2023-10-21 MED ORDER — PROPOFOL 500 MG/50ML IV EMUL
INTRAVENOUS | Status: DC | PRN
  Administered 2023-10-21: 50 ug/kg/min via INTRAVENOUS

## 2023-10-21 MED ORDER — HYDROMORPHONE HCL 1 MG/ML IJ SOLN
0.5000 mg | INTRAMUSCULAR | Status: DC | PRN
  Administered 2023-10-21: 0.5 mg via INTRAVENOUS
  Filled 2023-10-21: qty 0.5

## 2023-10-21 MED ORDER — CLOPIDOGREL BISULFATE 75 MG PO TABS
ORAL_TABLET | ORAL | Status: AC
  Filled 2023-10-21: qty 1

## 2023-10-21 MED ORDER — BISACODYL 5 MG PO TBEC: 5.0000 mg | DELAYED_RELEASE_TABLET | Freq: Every day | ORAL | Status: DC | PRN

## 2023-10-21 MED ORDER — ALBUMIN HUMAN 5 % IV SOLN: INTRAVENOUS | Status: DC | PRN

## 2023-10-21 MED ORDER — SODIUM CHLORIDE 0.9 % IV SOLN: INTRAVENOUS | Status: DC

## 2023-10-21 MED ORDER — ATORVASTATIN CALCIUM 40 MG PO TABS
40.0000 mg | ORAL_TABLET | Freq: Every day | ORAL | Status: DC
  Administered 2023-10-21 – 2023-10-23 (×3): 40 mg via ORAL
  Filled 2023-10-21 (×3): qty 1

## 2023-10-21 MED ORDER — DEXTROSE 50 % IV SOLN
INTRAVENOUS | Status: AC
  Administered 2023-10-21: 25 mL
  Filled 2023-10-21: qty 50

## 2023-10-21 MED ORDER — ACETAMINOPHEN 10 MG/ML IV SOLN
INTRAVENOUS | Status: AC
  Filled 2023-10-21: qty 100

## 2023-10-21 MED ORDER — GABAPENTIN 400 MG PO CAPS
800.0000 mg | ORAL_CAPSULE | Freq: Three times a day (TID) | ORAL | Status: DC
  Administered 2023-10-21 – 2023-10-23 (×6): 800 mg via ORAL
  Filled 2023-10-21 (×6): qty 2

## 2023-10-21 MED ORDER — ONDANSETRON HCL 4 MG/2ML IJ SOLN
INTRAMUSCULAR | Status: DC | PRN
  Administered 2023-10-21: 4 mg via INTRAVENOUS

## 2023-10-21 MED ORDER — HEPARIN 6000 UNIT IRRIGATION SOLUTION
Status: AC
Start: 2023-10-21 — End: ?
  Filled 2023-10-21: qty 500

## 2023-10-21 MED ORDER — INSULIN ASPART 100 UNIT/ML IJ SOLN
0.0000 [IU] | INTRAMUSCULAR | Status: AC | PRN
  Administered 2023-10-21 (×2): 4 [IU] via SUBCUTANEOUS

## 2023-10-21 MED ORDER — HYDROMORPHONE HCL 1 MG/ML IJ SOLN
INTRAMUSCULAR | Status: AC
  Filled 2023-10-21: qty 1

## 2023-10-21 MED ORDER — SUGAMMADEX SODIUM 200 MG/2ML IV SOLN
INTRAVENOUS | Status: DC | PRN
  Administered 2023-10-21: 85.2 mg via INTRAVENOUS

## 2023-10-21 MED ORDER — DEXAMETHASONE SODIUM PHOSPHATE 10 MG/ML IJ SOLN
INTRAMUSCULAR | Status: AC
  Filled 2023-10-21: qty 1

## 2023-10-21 MED ORDER — 0.9 % SODIUM CHLORIDE (POUR BTL) OPTIME
TOPICAL | Status: DC | PRN
  Administered 2023-10-21: 1000 mL

## 2023-10-21 MED ORDER — INSULIN ASPART 100 UNIT/ML IJ SOLN
0.0000 [IU] | Freq: Three times a day (TID) | INTRAMUSCULAR | Status: DC
  Administered 2023-10-22 (×2): 2 [IU] via SUBCUTANEOUS
  Administered 2023-10-22: 5 [IU] via SUBCUTANEOUS
  Administered 2023-10-23: 3 [IU] via SUBCUTANEOUS

## 2023-10-21 MED ORDER — PHENOL 1.4 % MT LIQD: 1.0000 | OROMUCOSAL | Status: DC | PRN

## 2023-10-21 MED ORDER — ACETAMINOPHEN 650 MG RE SUPP: 325.0000 mg | RECTAL | Status: DC | PRN

## 2023-10-21 MED ORDER — LEVOTHYROXINE SODIUM 100 MCG PO TABS
100.0000 ug | ORAL_TABLET | Freq: Every day | ORAL | Status: DC
  Administered 2023-10-22 – 2023-10-23 (×2): 100 ug via ORAL
  Filled 2023-10-21 (×2): qty 1

## 2023-10-21 MED ORDER — ORAL CARE MOUTH RINSE: 15.0000 mL | Freq: Once | OROMUCOSAL | Status: AC

## 2023-10-21 MED ORDER — CLOPIDOGREL BISULFATE 75 MG PO TABS
75.0000 mg | ORAL_TABLET | Freq: Once | ORAL | Status: AC
  Administered 2023-10-21: 75 mg via ORAL

## 2023-10-21 MED ORDER — CEFAZOLIN SODIUM-DEXTROSE 2-4 GM/100ML-% IV SOLN
2.0000 g | Freq: Three times a day (TID) | INTRAVENOUS | Status: AC
  Administered 2023-10-21 – 2023-10-22 (×2): 2 g via INTRAVENOUS
  Filled 2023-10-21 (×2): qty 100

## 2023-10-21 MED ORDER — OXYCODONE-ACETAMINOPHEN 5-325 MG PO TABS
1.0000 | ORAL_TABLET | ORAL | Status: DC | PRN
Start: 2023-10-21 — End: 2023-10-23
  Administered 2023-10-22 – 2023-10-23 (×3): 1 via ORAL
  Filled 2023-10-21 (×3): qty 1

## 2023-10-21 MED ORDER — CEFAZOLIN SODIUM-DEXTROSE 2-4 GM/100ML-% IV SOLN
2.0000 g | INTRAVENOUS | Status: AC
Start: 2023-10-21 — End: 2023-10-21
  Administered 2023-10-21: 2 g via INTRAVENOUS

## 2023-10-21 MED ORDER — LIDOCAINE 2% (20 MG/ML) 5 ML SYRINGE
INTRAMUSCULAR | Status: DC | PRN
  Administered 2023-10-21: 40 mg via INTRAVENOUS

## 2023-10-21 MED ORDER — GUAIFENESIN-DM 100-10 MG/5ML PO SYRP: 15.0000 mL | ORAL_SOLUTION | ORAL | Status: DC | PRN

## 2023-10-21 MED ORDER — PANTOPRAZOLE SODIUM 40 MG PO TBEC
40.0000 mg | DELAYED_RELEASE_TABLET | Freq: Every day | ORAL | Status: DC
  Administered 2023-10-21 – 2023-10-23 (×3): 40 mg via ORAL
  Filled 2023-10-21 (×3): qty 1

## 2023-10-21 MED ORDER — HEPARIN 6000 UNIT IRRIGATION SOLUTION
Status: DC | PRN
  Administered 2023-10-21: 1

## 2023-10-21 MED ORDER — FENTANYL CITRATE (PF) 250 MCG/5ML IJ SOLN
INTRAMUSCULAR | Status: DC | PRN
  Administered 2023-10-21: 50 ug via INTRAVENOUS
  Administered 2023-10-21: 100 ug via INTRAVENOUS
  Administered 2023-10-21: 50 ug via INTRAVENOUS
  Administered 2023-10-21: 150 ug via INTRAVENOUS
  Administered 2023-10-21: 50 ug via INTRAVENOUS
  Administered 2023-10-21: 100 ug via INTRAVENOUS

## 2023-10-21 MED ORDER — METFORMIN HCL 500 MG PO TABS
500.0000 mg | ORAL_TABLET | Freq: Two times a day (BID) | ORAL | Status: DC
  Administered 2023-10-21 – 2023-10-23 (×4): 500 mg via ORAL
  Filled 2023-10-21 (×4): qty 1

## 2023-10-21 MED ORDER — CLOPIDOGREL BISULFATE 75 MG PO TABS
75.0000 mg | ORAL_TABLET | Freq: Every day | ORAL | Status: DC
  Administered 2023-10-22 – 2023-10-23 (×2): 75 mg via ORAL
  Filled 2023-10-21 (×2): qty 1

## 2023-10-21 MED ORDER — LIDOCAINE 2% (20 MG/ML) 5 ML SYRINGE
INTRAMUSCULAR | Status: AC
  Filled 2023-10-21: qty 5

## 2023-10-21 MED ORDER — PHENYLEPHRINE 80 MCG/ML (10ML) SYRINGE FOR IV PUSH (FOR BLOOD PRESSURE SUPPORT)
PREFILLED_SYRINGE | INTRAVENOUS | Status: DC | PRN
  Administered 2023-10-21: 240 ug via INTRAVENOUS
  Administered 2023-10-21: 160 ug via INTRAVENOUS

## 2023-10-21 MED ORDER — ACETAMINOPHEN 10 MG/ML IV SOLN
1000.0000 mg | Freq: Once | INTRAVENOUS | Status: DC | PRN
  Administered 2023-10-21: 1000 mg via INTRAVENOUS

## 2023-10-21 SURGICAL SUPPLY — 61 items
APPLIER CLIP 9.375 SM OPEN (CLIP) ×2 IMPLANT
BAG COUNTER SPONGE SURGICOUNT (BAG) ×1 IMPLANT
BANDAGE ESMARK 6X9 LF (GAUZE/BANDAGES/DRESSINGS) IMPLANT
BLADE CLIPPER SURG (BLADE) ×1 IMPLANT
BNDG ELASTIC 6X15 VLCR STRL LF (GAUZE/BANDAGES/DRESSINGS) IMPLANT
BNDG ESMARK 6X9 LF (GAUZE/BANDAGES/DRESSINGS) IMPLANT
CANISTER SUCT 3000ML PPV (MISCELLANEOUS) ×1 IMPLANT
CANNULA VESSEL 3MM 2 BLNT TIP (CANNULA) IMPLANT
CATH EMB 4FR 40 (CATHETERS) IMPLANT
CLIP APPLIE 9.375 SM OPEN (CLIP) ×1 IMPLANT
CLIP FOGARTY SPRING 6M (CLIP) IMPLANT
CLIP TI MEDIUM 24 (CLIP) ×1 IMPLANT
CLIP TI MEDIUM 6 (CLIP) ×1 IMPLANT
CLIP TI WIDE RED SMALL 24 (CLIP) ×1 IMPLANT
CLIP TI WIDE RED SMALL 6 (CLIP) ×1 IMPLANT
COVER PROBE W GEL 5X96 (DRAPES) ×1 IMPLANT
CUFF TOURN SGL QUICK 42 (TOURNIQUET CUFF) IMPLANT
CUFF TRNQT CYL 24X4X16.5-23 (TOURNIQUET CUFF) IMPLANT
CUFF TRNQT CYL 34X4.125X (TOURNIQUET CUFF) IMPLANT
DERMABOND ADVANCED .7 DNX12 (GAUZE/BANDAGES/DRESSINGS) ×1 IMPLANT
DRAIN CHANNEL 15F RND FF W/TCR (WOUND CARE) IMPLANT
DRAPE C-ARM 42X72 X-RAY (DRAPES) IMPLANT
DRAPE HALF SHEET 40X57 (DRAPES) IMPLANT
ELECT REM PT RETURN 9FT ADLT (ELECTROSURGICAL) ×1 IMPLANT
ELECTRODE REM PT RTRN 9FT ADLT (ELECTROSURGICAL) ×1 IMPLANT
EVACUATOR SILICONE 100CC (DRAIN) IMPLANT
GLOVE BIOGEL PI IND STRL 8 (GLOVE) ×1 IMPLANT
GOWN STRL REUS W/ TWL LRG LVL3 (GOWN DISPOSABLE) ×3 IMPLANT
GOWN STRL REUS W/TWL 2XL LVL3 (GOWN DISPOSABLE) ×2 IMPLANT
HEMOSTAT SNOW SURGICEL 2X4 (HEMOSTASIS) IMPLANT
INSERT FOGARTY SM (MISCELLANEOUS) IMPLANT
KIT BASIN OR (CUSTOM PROCEDURE TRAY) ×1 IMPLANT
KIT TURNOVER KIT B (KITS) ×1 IMPLANT
LOOP VESSEL MAXI 1X406 RED (MISCELLANEOUS) IMPLANT
NS IRRIG 1000ML POUR BTL (IV SOLUTION) ×2 IMPLANT
PACK PERIPHERAL VASCULAR (CUSTOM PROCEDURE TRAY) ×1 IMPLANT
PAD ARMBOARD POSITIONER FOAM (MISCELLANEOUS) ×2 IMPLANT
SET MICROPUNCTURE 5F STIFF (MISCELLANEOUS) IMPLANT
SLEEVE SURGEON STRL (DRAPES) IMPLANT
SPONGE T-LAP 18X18 ~~LOC~~+RFID (SPONGE) IMPLANT
STAPLER SKIN PROX WIDE 3.9 (STAPLE) IMPLANT
STOPCOCK 4 WAY LG BORE MALE ST (IV SETS) IMPLANT
SURGIFLO W/THROMBIN 8M KIT (HEMOSTASIS) IMPLANT
SUT ETHILON 3 0 PS 1 (SUTURE) IMPLANT
SUT MNCRL AB 4-0 PS2 18 (SUTURE) ×3 IMPLANT
SUT PROLENE 5 0 C 1 24 (SUTURE) ×1 IMPLANT
SUT PROLENE 5 0 CC 1 (SUTURE) IMPLANT
SUT PROLENE 6 0 BV (SUTURE) ×1 IMPLANT
SUT PROLENE 7 0 BV 1 (SUTURE) IMPLANT
SUT SILK 1 TIES 10X30 (SUTURE) IMPLANT
SUT SILK 2 0 PERMA HAND 18 BK (SUTURE) IMPLANT
SUT SILK 2 0 SH (SUTURE) IMPLANT
SUT SILK 3-0 18XBRD TIE 12 (SUTURE) IMPLANT
SUT VIC AB 2-0 CT1 TAPERPNT 27 (SUTURE) ×2 IMPLANT
SUT VIC AB 3-0 SH 27X BRD (SUTURE) ×3 IMPLANT
TAPE UMBILICAL 1/8X30 (MISCELLANEOUS) IMPLANT
TOWEL GREEN STERILE (TOWEL DISPOSABLE) ×1 IMPLANT
TRAY FOLEY MTR SLVR 16FR STAT (SET/KITS/TRAYS/PACK) ×1 IMPLANT
TUBING EXTENTION W/L.L. (IV SETS) IMPLANT
UNDERPAD 30X36 HEAVY ABSORB (UNDERPADS AND DIAPERS) ×1 IMPLANT
WATER STERILE IRR 1000ML POUR (IV SOLUTION) ×1 IMPLANT

## 2023-10-21 NOTE — Consult Note (Deleted)
 NAME: Martha Lozano    MRN: [id number] DOB: 06-09-1961    DATE OF OPERATION: 10/21/2023  PREOP DIAGNOSIS:    Left lower extremity critical limb ischemia with tissue loss at the great toe  POSTOP DIAGNOSIS:    Same  PROCEDURE:    Left greater saphenous vein harvest Redo exposure of the left common femoral artery, distal external iliac artery Left iliofemoral endarterectomy extended onto the profunda, saphenous vein patch Left femoral to below-knee popliteal artery bypass using nonreversed greater saphenous vein  SURGEON: Victorino Sparrow  ASSIST: Nathanial Rancher, PA  ANESTHESIA: General  EBL: 300 mL  INDICATIONS:    Martha Lozano is a 63 y.o. female with prior history of left-sided common femoral to above-knee popliteal artery bypass for tissue loss in August 2024.  This occluded, but the wound on the foot healed.  She presented to the ED with new onset left-sided rest pain with diagnostic angiography demonstrating occlusion of the left common femoral vein, profunda.  Patient with critical limb ischemia with tissue loss at the left great toe.  After discussing risks and benefits of redo exposure of the left common femoral artery, iliofemoral endarterectomy, left femoral to below-knee popliteal artery bypass, [name] elected to proceed.  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  General esthesia was induced and patient from representative fashion.  The case began with ultrasound insonation of the left leg greater saphenous vein.  This was marked, and a total of 4 skip incisions were made.  Saphenectomy followed in standard fashion.  Once the saphenous vein was harvested.  I then moved to expose the left below-knee popliteal artery.  This was done through one of the skip  incisions.  The below-knee popliteal artery was controlled with use of vessel loop.  Next, my attention turned to the left common femoral artery.  This was a redo exposure of the artery, and took a considerable amount of time, greater than 30 minutes more than normal in an effort to expose the left distal external iliac artery, common femoral artery, 2 profunda branches, superficial femoral artery.  Once exposed, all branches were controlled with use of Vesseloops.  Next, an 8 mm tunneler was brought onto the field and tunneled in subfascial plane from the below-knee popliteal artery to the left common femoral artery.  The patient was heparinized.  The distal extrailiac artery, common femoral artery, profunda, superficial femoral artery and all other branches were controlled with Vesseloops and vascular clamps, and the artery was opened through a longitudinal arteriotomy.  At the time of opening, the hood of the previous 6 mm ringed PTFE bypass was removed.  A 0 silk was used to oversew the bypass that was occluded.  Next, left-sided iliofemoral endarterectomy followed.  The lesion was very interesting, it appeared to be chronic thrombus.  The arteriotomy was extended onto the profunda.  Next, all profunda branches were backbled to ensure there was no thrombus within the vessels.  A section of the previously harvested greater saphenous vein was transected, splayed, and Solman standard fashion as a patch using 6-0 Prolene suture.  Upon completion, clamps were removed.  There was a small vascular injury in the distal extrailiac artery at the 3 o'clock position where the wall.  Then from endarterectomy.  This was  repaired with a pledgeted suture.  Next, the artery was clamped lower, and the greater saphenous vein conduit brought onto the field.  A longitudinal arteriotomy was made on the previously sewn patch.  The bypass was splayed, and sewn in an inside fashion using running 6-0 Prolene suture.  Next, the common  femoral artery and bypass were pressurized.  A Doppler was brought to the field to ensure that the profunda branches were widely patent.  They were.  A hand-held valvulotome was brought onto the field, and valves lysed under direct visualization.  Next, the vein was placed through the tunneling device, exiting in the below-knee popliteal artery space.  The knee was extended, vein marked, then transected at the appropriate length.  The below-knee popliteal artery was controlled with use of Vesseloops and clamps, a longitudinal arteriotomy made.  The distal portion of the greater saphenous vein was splayed, and sewn in end-to-side fashion using running 6-0 Prolene suture.  At completion, there is an excellent pulse distal to the bypass.  There was a pulse in the foot and both the posterior tibial and dorsalis pedis arteries.  Heparin was reversed.  Incision sites were irrigated with copious amounts of saline.  Loosely was placed in the groin as well as the bony pelvis artery space, and the wound beds were closed in layers using Vicryl suture with Monocryl and Dermabond and the level of the skin.  At completion, Senai had a palpable dorsalis pedis and posterior tibial artery pulse in the left foot.  Victorino Sparrow, MD [facility] DATE OF DICTATION:   10/21/2023

## 2023-10-21 NOTE — Progress Notes (Signed)
 Comfortable 2+ DP Groin soft

## 2023-10-21 NOTE — Progress Notes (Signed)
 Hypoglycemic Event  CBG: 65  Treatment: D50 25 mL (12.5 gm)  Symptoms: None  Follow-up CBG: Time:1643 CBG Result:146  Possible Reasons for Event: Inadequate meal intake  Comments/MD notified:    Dortha Kern

## 2023-10-21 NOTE — Consult Note (Signed)
 ED consult  Patient seen and examined in preop holding.  No complaints. No changes to medication history or physical exam since last seen. As noted below, patient has a history of left common femoral artery to above-knee popliteal artery bypass in August.  The bypass occluded, however the patient was asymptomatic.  She recently came in with worsening of symptoms, with angiogram demonstrating occlusion of the profunda and common femoral artery. After discussing risks and benefits of left iliofemoral endarterectomy, left femoral to below-knee popliteal artery bypass for critical limb ischemia with tissue loss at the toe, Song elected to proceed.   Victorino Sparrow MD   Reason for Consult: Left foot pain Referring Physician: Dr. Adela Lank MRN #:  [id number]  History of Present Illness This is a 63 y.o. female history of left common femoral to above-knee popliteal artery bypass with Dr. 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  Patient's wounds subsequently healed.  Last evaluation was earlier this year where she was found to have occluded bypass graft.  She has been free of pain until the other day when she hit her toe.  She denies any fevers or chills but states that the toenail hurts.  She presents to the emergency department for pain in the left great toe but states that it has been better since presentation.  She was diagnosed aspirin, Plavix and a statin however she only notes that she takes aspirin and no other blood thinners.  Past Medical History:  Diagnosis Date   Breast cancer, stage 0, right    pt currently taking Tamoxifen   Diabetes mellitus without complication (HCC)    Family history of breast cancer    Family history of prostate cancer    Family history of skin cancer    Hep C w/o coma, chronic (HCC)    Hx of hepatitis C    pt received treatment for Hep C in 2018   Peripheral arterial disease (HCC)     Sciatic pain     Past Surgical History:  Procedure Laterality Date   ABDOMINAL AORTOGRAM N/A 10/07/2023   Procedure: ABDOMINAL AORTOGRAM;  Surgeon: Maeola Harman, MD;  Location: [facility] INVASIVE CV LAB;  Service: Cardiovascular;  Laterality: N/A;   ABDOMINAL AORTOGRAM W/LOWER EXTREMITY N/A 03/04/2023   Procedure: ABDOMINAL AORTOGRAM W/LOWER EXTREMITY;  Surgeon: Maeola Harman, MD;  Location: [facility] INVASIVE CV LAB;  Service: Cardiovascular;  Laterality: N/A;   BREAST BIOPSY Right 09/12/2017   ENDARTERECTOMY FEMORAL Left 03/05/2023   Procedure: ILIOFEMORAL ENDARTERECTOMY;  Surgeon: Victorino Sparrow, MD;  Location: [facility] OR;  Service: Vascular;  Laterality: Left;   FEMORAL-POPLITEAL BYPASS GRAFT Left 03/05/2023   Procedure: LEFT FEMORAL-ABOVE KNEE POPLITEAL ARTERY BYPASS GRAFT USING PROPATEN RING GRAFT X 80CM;  Surgeon: Victorino Sparrow, MD;  Location: [facility] OR;  Service: Vascular;  Laterality: Left;   INCISION AND DRAINAGE ABSCESS Left    hand   LOWER EXTREMITY ANGIOGRAPHY N/A 10/07/2023   Procedure: Lower Extremity Angiography;  Surgeon: Maeola Harman, MD;  Location: [facility] INVASIVE CV LAB;  Service: Cardiovascular;  Laterality: N/A;    No Known Allergies  Prior to Admission medications   Medication Sig Start Date End Date Taking? Authorizing Provider  aspirin EC 81 MG tablet Take 1 tablet (81 mg total) by mouth daily. Swallow whole. 03/08/23   Glade Lloyd, MD  atorvastatin (LIPITOR) 40 MG tablet Take 1 tablet (40 mg total) by mouth  daily. 03/08/23   Glade Lloyd, MD  Buprenorphine HCl-Naloxone HCl (SUBOXONE) 12-3 MG FILM Place 1 Film under the tongue 2 (two) times daily.     [provider]  clopidogrel (PLAVIX) 75 MG tablet Take 1 tablet (75 mg total) by mouth daily at 6 (six) AM. 03/08/23   Glade Lloyd, MD  gabapentin (NEURONTIN) 100 MG capsule Take 100 mg by mouth 3 (three) times daily. 11/22/20   [provider]  glipiZIDE (GLUCOTROL XL) 5 MG  24 hr tablet Take 1 tablet (5 mg total) by mouth daily with breakfast. 11/06/18   Serena Croissant, MD  HYDROcodone-acetaminophen (NORCO/VICODIN) 5-325 MG tablet Take 1 tablet by mouth every 6 (six) hours as needed for moderate pain. 03/07/23   Emilie Rutter, PA-C  levothyroxine (SYNTHROID) 100 MCG tablet Take 1 tablet (100 mcg total) by mouth daily before breakfast. 11/15/20   Serena Croissant, MD  losartan (COZAAR) 50 MG tablet Take 0.5 tablets (25 mg total) by mouth daily. 03/07/23   Glade Lloyd, MD  metFORMIN (GLUCOPHAGE) 500 MG tablet Take 1 tablet (500 mg total) by mouth 2 (two) times daily with a meal. 03/07/23   Glade Lloyd, MD  mupirocin ointment (BACTROBAN) 2 % Apply 1 Application topically daily. 03/28/23   Edwin Cap, DPM  naloxone [facility]) nasal spray 4 mg/0.1 mL SMARTSIG:Both Nares 11/08/20   [provider]  venlafaxine XR (EFFEXOR-XR) 37.5 MG 24 hr capsule Take by mouth. 11/11/18   [provider]    Social History   Socioeconomic History   Marital status: Widowed    Spouse name: Not on file   Number of children: Not on file   Years of education: Not on file   Highest education level: Not on file  Occupational History   Not on file  Tobacco Use   Smoking status: Every Day    Current packs/day: 0.33    Average packs/day: 0.3 packs/day for 35.0 years (11.6 ttl pk-yrs)    Types: Cigarettes   Smokeless tobacco: Never  Vaping Use   Vaping status: Some Days   Substances: Nicotine  Substance and Sexual Activity   Alcohol use: Yes    Comment: Occasional   Drug use: Not Currently    Comment: history substance abuse    Sexual activity: Yes    Birth control/protection: None  Other Topics Concern   Not on file  Social History Narrative   Not on file   Social Drivers of Health   Financial Resource Strain: Low Risk  (04/19/2023)   Received from [facility]   Overall Financial Resource Strain (CARDIA)    Difficulty of Paying Living Expenses: Not  hard at all  Food Insecurity: No Food Insecurity (04/19/2023)   Received from [facility]   Hunger Vital Sign    Worried About Running Out of Food in the Last Year: Never true    Ran Out of Food in the Last Year: Never true  Transportation Needs: No Transportation Needs (04/19/2023)   Received from [facility] - Transportation    Lack of Transportation (Medical): No    Lack of Transportation (Non-Medical): No  Physical Activity: Insufficiently Active (04/19/2023)   Received from [facility]   Exercise Vital Sign    Days of Exercise per Week: 5 days    Minutes of Exercise per Session: 20 min  Stress: No Stress Concern Present (04/19/2023)   Received from [facility]  Occupational Stress Questionnaire    Feeling of Stress : Not at all  Social Connections: Moderately Integrated (04/19/2023)   Received from [facility]   Social Connection and Isolation Panel [NHANES]    Frequency of Communication with Friends and Family: More than three times a week    Frequency of Social Gatherings with Friends and Family: Three times a week    Attends Religious Services: More than 4 times per year    Active Member of Clubs or Organizations: No    Attends Banker Meetings: 1 to 4 times per year    Marital Status: Widowed  Intimate Partner Violence: Not At Risk (04/19/2023)   Received from [facility]   Humiliation, Afraid, Rape, and Kick questionnaire    Fear of Current or Ex-Partner: No    Emotionally Abused: No    Physically Abused: No    Sexually Abused: No     Family History  Problem Relation Age of Onset   Diabetes Sister    Hypertension Sister    Breast cancer Sister 18   Diabetes Brother    Breast cancer Maternal Grandmother 46   Cervical cancer Mother    Prostate cancer Father 107       metastatic to bones   Cancer Maternal Aunt        type unk dx <50   Skin cancer Paternal Uncle    Pulmonary  embolism Maternal Grandfather 43   Cancer Paternal Uncle    Breast cancer Other    Stomach cancer Other     Review of Systems  Constitutional: Negative.   HENT: Negative.    Respiratory: Negative.    Cardiovascular: Negative.   Musculoskeletal:        Left foot pain  Skin: Negative.   Neurological: Negative.   Endo/Heme/Allergies: Negative.   Psychiatric/Behavioral: Negative.        Physical Examination  Vitals:   10/21/23 0615  BP: (!) 144/92  Pulse: 82  Resp: 18  Temp: 98.1 F (36.7 C)  SpO2: 95%   Body mass index is 18.36 kg/m.  Physical Exam HENT:     Nose: Nose normal.  Eyes:     Pupils: Pupils are equal, round, and reactive to light.  Cardiovascular:     Pulses:          Femoral pulses are 2+ on the right side and 1+ on the left side. Abdominal:     General: Abdomen is flat.     Palpations: Abdomen is soft.  Skin:    Capillary Refill: Capillary refill takes more than 3 seconds.     Comments: Left foot pictured below  Neurological:     General: No focal deficit present.     Mental Status: She is alert.  Psychiatric:        Mood and Affect: Mood normal.       CBC    Component Value Date/Time   WBC 7.6 10/21/2023 0553   RBC 4.77 10/21/2023 0553   HGB 13.5 10/21/2023 0553   HCT 41.9 10/21/2023 0553   PLT 216 10/21/2023 0553   MCV 87.8 10/21/2023 0553   MCH 28.3 10/21/2023 0553   MCHC 32.2 10/21/2023 0553   RDW 13.2 10/21/2023 0553   LYMPHSABS 3.3 02/26/2023 1110   MONOABS 1.3 (H) 02/26/2023 1110   EOSABS 0.3 02/26/2023 1110   BASOSABS 0.1 02/26/2023 1110    BMET    Component Value Date/Time   NA 138 10/21/2023 0553  K 4.2 10/21/2023 0553   CL 99 10/21/2023 0553   CO2 29 10/21/2023 0553   GLUCOSE 130 (H) 10/21/2023 0553   BUN 15 10/21/2023 0553   CREATININE 0.70 10/21/2023 0553   CREATININE 0.71 03/09/2014 0916   CALCIUM 9.5 10/21/2023 0553   GFRNONAA >60 10/21/2023 0553   GFRNONAA >89 03/09/2014 0916   GFRAA >89 03/09/2014  0916    COAGS: Lab Results  Component Value Date   INR 1.0 10/21/2023     Non-Invasive Vascular Imaging:   No new studies  ASSESSMENT/PLAN: This is a 63 y.o. 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  She has a known left common femoral to below-knee popliteal artery bypass.  We discussed proceeding with repeat angiography given a limb threatening nature of her disease process and she demonstrates good understanding.  Patient states that she cannot stay in the hospital today and as such I will have tried to her to arrange angiogram next week.  She can continue aspirin and Plavix periprocedurally and she demonstrates good understanding of the risk and benefits.  The proper discharged and the office will contact her.  Brandon C. Randie Heinz, MD [facility] [place] Office: [phone] Pager: [phone]

## 2023-10-21 NOTE — Transfer of Care (Signed)
 Immediate Anesthesia Transfer of Care Note  Patient: Martha Lozano  Procedure(s) Performed: LEFT ILIO - FEMORAL ENDARTARECTOMY (Left: Leg Lower) REDO LEFT FEMORAL-BELOW KNEE IPSITATERAL SAPHENOUS BYPASS WITH GREATER SAPHENOUS VEIN HARVEST (Left: Leg Lower)  Patient Location: PACU  Anesthesia Type:General  Level of Consciousness: awake and alert   Airway & Oxygen Therapy: Patient Spontanous Breathing and Patient connected to nasal cannula oxygen  Post-op Assessment: Report given to RN and Post -op Vital signs reviewed and stable  Post vital signs: Reviewed and stable  Last Vitals:  Vitals Value Taken Time  BP 144/99 10/21/23 1245  Temp 98   Pulse 101 10/21/23 1246  Resp 12 10/21/23 1246  SpO2 100 % 10/21/23 1246  Vitals shown include unfiled device data.  Last Pain:  Vitals:   10/21/23 0630  TempSrc:   PainSc: 9          Complications: No notable events documented.

## 2023-10-21 NOTE — Anesthesia Procedure Notes (Addendum)
 Procedure Name: Intubation Date/Time: 10/21/2023 7:45 AM  Performed by: Atilano Median, DOPre-anesthesia Checklist: Patient identified, Emergency Drugs available, Suction available and Patient being monitored Patient Re-evaluated:Patient Re-evaluated prior to induction Oxygen Delivery Method: Circle system utilized Preoxygenation: Pre-oxygenation with 100% oxygen Induction Type: IV induction Ventilation: Mask ventilation without difficulty Laryngoscope Size: Mac and 4 Grade View: Grade I Tube type: Oral Tube size: 6.5 mm Number of attempts: 1 Airway Equipment and Method: Stylet and Oral airway Placement Confirmation: ETT inserted through vocal cords under direct vision, positive ETCO2 and breath sounds checked- equal and bilateral Secured at: 21 cm Tube secured with: Tape Dental Injury: Teeth and Oropharynx as per pre-operative assessment

## 2023-10-21 NOTE — Anesthesia Preprocedure Evaluation (Signed)
 Anesthesia Evaluation  Patient identified by MRN, date of birth, ID band Patient awake    Reviewed: Allergy & Precautions, NPO status , Patient's Chart, lab work & pertinent test results  Airway Mallampati: II  TM Distance: >3 FB Neck ROM: Full    Dental no notable dental hx.    Pulmonary neg pulmonary ROS, Current Smoker   Pulmonary exam normal        Cardiovascular hypertension, Pt. on medications + Peripheral Vascular Disease   Rhythm:Regular Rate:Normal     Neuro/Psych negative neurological ROS  negative psych ROS   GI/Hepatic negative GI ROS,,,(+) Hepatitis -, C  Endo/Other  diabetes, Type 2, Insulin Dependent, Oral Hypoglycemic Agents    Renal/GU   negative genitourinary   Musculoskeletal negative musculoskeletal ROS (+)    Abdominal Normal abdominal exam  (+)   Peds  Hematology Lab Results      Component                Value               Date                      WBC                      7.6                 10/21/2023                HGB                      13.5                10/21/2023                HCT                      41.9                10/21/2023                MCV                      87.8                10/21/2023                PLT                      216                 10/21/2023             Lab Results      Component                Value               Date                      NA                       138                 10/21/2023                K  4.2                 10/21/2023                CO2                      29                  10/21/2023                GLUCOSE                  130 (H)             10/21/2023                BUN                      15                  10/21/2023                CREATININE               0.70                10/21/2023                CALCIUM                  9.5                 10/21/2023                GFRNONAA                  >60                 10/21/2023              Anesthesia Other Findings   Reproductive/Obstetrics                             Anesthesia Physical Anesthesia Plan  ASA: 3  Anesthesia Plan: General   Post-op Pain Management:    Induction: Intravenous  PONV Risk Score and Plan: 2 and Ondansetron, Dexamethasone, Treatment may vary due to age or medical condition and Midazolam  Airway Management Planned: Mask and Oral ETT  Additional Equipment: Arterial line  Intra-op Plan:   Post-operative Plan: Possible Post-op intubation/ventilation  Informed Consent: I have reviewed the patients History and Physical, chart, labs and discussed the procedure including the risks, benefits and alternatives for the proposed anesthesia with the patient or authorized representative who has indicated his/her understanding and acceptance.     Dental advisory given  Plan Discussed with: CRNA  Anesthesia Plan Comments:         Anesthesia Quick Evaluation

## 2023-10-21 NOTE — Discharge Instructions (Signed)

## 2023-10-21 NOTE — Anesthesia Procedure Notes (Signed)
 Arterial Line Insertion Start/End4/01/2024 7:10 AM, 10/21/2023 7:25 AM Performed by: Darryl Nestle, CRNA, CRNA  Patient location: Pre-op. Preanesthetic checklist: patient identified, IV checked, site marked, risks and benefits discussed, surgical consent, monitors and equipment checked, pre-op evaluation, timeout performed and anesthesia consent Lidocaine 1% used for infiltration Left, radial was placed Catheter size: 20 G Hand hygiene performed  and maximum sterile barriers used   Attempts: 4 Procedure performed using ultrasound guided technique. Ultrasound Notes:anatomy identified, needle tip was noted to be adjacent to the nerve/plexus identified and no ultrasound evidence of intravascular and/or intraneural injection Following insertion, dressing applied and Biopatch. Post procedure assessment: normal and unchanged  Post procedure complications: unsuccessful attempts. Patient tolerated the procedure well with no immediate complications.

## 2023-10-21 NOTE — H&P (Signed)
 ED consult   Patient seen and examined in preop holding.  No complaints. No changes to medication history or physical exam since last seen. As noted below, patient has a history of left common femoral artery to above-knee popliteal artery bypass in August.  The bypass occluded, however the patient was asymptomatic.  She recently came in with worsening of symptoms, with angiogram demonstrating occlusion of the profunda and common femoral artery. After discussing risks and benefits of left iliofemoral endarterectomy, left femoral to below-knee popliteal artery bypass for critical limb ischemia with tissue loss at the toe, Martha Lozano elected to proceed.     Martha Sparrow MD     Reason for Consult: Left foot pain Referring Physician: Dr. Adela Lank MRN #:  [id number]   History of Present Illness This is a 63 y.o. female history of left common femoral to above-knee popliteal artery bypass with Dr. 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  Patient's wounds subsequently healed.  Last evaluation was earlier this year where she was found to have occluded bypass graft.  She has been free of pain until the other day when she hit her toe.  She denies any fevers or chills but states that the toenail hurts.  She presents to the emergency department for pain in the left great toe but states that it has been better since presentation.  She was diagnosed aspirin, Plavix and a statin however she only notes that she takes aspirin and no other blood thinners.       Past Medical History:  Diagnosis Date   Breast cancer, stage 0, right      pt currently taking Tamoxifen   Diabetes mellitus without complication (HCC)     Family history of breast cancer     Family history of prostate cancer     Family history of skin cancer     Hep C w/o coma, chronic (HCC)     Hx of hepatitis C      pt received treatment for Hep C in 2018   Peripheral  arterial disease (HCC)     Sciatic pain                 Past Surgical History:  Procedure Laterality Date   ABDOMINAL AORTOGRAM N/A 10/07/2023    Procedure: ABDOMINAL AORTOGRAM;  Surgeon: Maeola Harman, MD;  Location: [facility] INVASIVE CV LAB;  Service: Cardiovascular;  Laterality: N/A;   ABDOMINAL AORTOGRAM W/LOWER EXTREMITY N/A 03/04/2023    Procedure: ABDOMINAL AORTOGRAM W/LOWER EXTREMITY;  Surgeon: Maeola Harman, MD;  Location: [facility] INVASIVE CV LAB;  Service: Cardiovascular;  Laterality: N/A;   BREAST BIOPSY Right 09/12/2017   ENDARTERECTOMY FEMORAL Left 03/05/2023    Procedure: ILIOFEMORAL ENDARTERECTOMY;  Surgeon: Martha Sparrow, MD;  Location[facility];  Service: Vascular;  Laterality: Left;   FEMORAL-POPLITEAL BYPASS GRAFT Left 03/05/2023    Procedure: LEFT FEMORAL-ABOVE KNEE POPLITEAL ARTERY BYPASS GRAFT USING PROPATEN RING GRAFT X 80CM;  Surgeon: Martha Sparrow, MD;  Location: [facility] OR;  Service: Vascular;  Laterality: Left;   INCISION AND DRAINAGE ABSCESS Left      hand   LOWER EXTREMITY ANGIOGRAPHY N/A 10/07/2023    Procedure: Lower Extremity Angiography;  Surgeon: Maeola Harman, MD;  Location: [facility] INVASIVE CV LAB;  Service: Cardiovascular;  Laterality: N/A;          Allergies  No Known Allergies  Prior to Admission medications   Medication Sig Start Date End Date Taking? Authorizing Provider  aspirin EC 81 MG tablet Take 1 tablet (81 mg total) by mouth daily. Swallow whole. 03/08/23     Glade Lloyd, MD  atorvastatin (LIPITOR) 40 MG tablet Take 1 tablet (40 mg total) by mouth daily. 03/08/23     Glade Lloyd, MD  Buprenorphine HCl-Naloxone HCl (SUBOXONE) 12-3 MG FILM Place 1 Film under the tongue 2 (two) times daily.        [provider]  clopidogrel (PLAVIX) 75 MG tablet Take 1 tablet (75 mg total) by mouth daily at 6 (six) AM. 03/08/23     Glade Lloyd, MD  gabapentin (NEURONTIN) 100 MG capsule Take 100 mg by mouth 3  (three) times daily. 11/22/20     [provider]  glipiZIDE (GLUCOTROL XL) 5 MG 24 hr tablet Take 1 tablet (5 mg total) by mouth daily with breakfast. 11/06/18     Serena Croissant, MD  HYDROcodone-acetaminophen (NORCO/VICODIN) 5-325 MG tablet Take 1 tablet by mouth every 6 (six) hours as needed for moderate pain. 03/07/23     Emilie Rutter, PA-C  levothyroxine (SYNTHROID) 100 MCG tablet Take 1 tablet (100 mcg total) by mouth daily before breakfast. 11/15/20     Serena Croissant, MD  losartan (COZAAR) 50 MG tablet Take 0.5 tablets (25 mg total) by mouth daily. 03/07/23     Glade Lloyd, MD  metFORMIN (GLUCOPHAGE) 500 MG tablet Take 1 tablet (500 mg total) by mouth 2 (two) times daily with a meal. 03/07/23     Glade Lloyd, MD  mupirocin ointment (BACTROBAN) 2 % Apply 1 Application topically daily. 03/28/23     Edwin Cap, DPM  naloxone [facility]) nasal spray 4 mg/0.1 mL SMARTSIG:Both Nares 11/08/20     [provider]  venlafaxine XR (EFFEXOR-XR) 37.5 MG 24 hr capsule Take by mouth. 11/11/18     [provider]      Social History         Socioeconomic History   Marital status: Widowed      Spouse name: Not on file   Number of children: Not on file   Years of education: Not on file   Highest education level: Not on file  Occupational History   Not on file  Tobacco Use   Smoking status: Every Day      Current packs/day: 0.33      Average packs/day: 0.3 packs/day for 35.0 years (11.6 ttl pk-yrs)      Types: Cigarettes   Smokeless tobacco: Never  Vaping Use   Vaping status: Some Days   Substances: Nicotine  Substance and Sexual Activity   Alcohol use: Yes      Comment: Occasional   Drug use: Not Currently      Comment: history substance abuse    Sexual activity: Yes      Birth control/protection: None  Other Topics Concern   Not on file  Social History Narrative   Not on file    Social Drivers of Health        Financial Resource Strain: Low Risk   (04/19/2023)    Received from [facility]    Overall Financial Resource Strain (CARDIA)     Difficulty of Paying Living Expenses: Not hard at all  Food Insecurity: No Food Insecurity (04/19/2023)    Received from [facility]    Hunger Vital Sign     Worried About Running Out of Food  in the Last Year: Never true     Ran Out of Food in the Last Year: Never true  Transportation Needs: No Transportation Needs (04/19/2023)    Received from [facility] - Transportation     Lack of Transportation (Medical): No     Lack of Transportation (Non-Medical): No  Physical Activity: Insufficiently Active (04/19/2023)    Received from [facility]    Exercise Vital Sign     Days of Exercise per Week: 5 days     Minutes of Exercise per Session: 20 min  Stress: No Stress Concern Present (04/19/2023)    Received from [facility] - Occupational Stress Questionnaire     Feeling of Stress : Not at all  Social Connections: Moderately Integrated (04/19/2023)    Received from [facility]    Social Connection and Isolation Panel [NHANES]     Frequency of Communication with Friends and Family: More than three times a week     Frequency of Social Gatherings with Friends and Family: Three times a week     Attends Religious Services: More than 4 times per year     Active Member of Clubs or Organizations: No     Attends Banker Meetings: 1 to 4 times per year     Marital Status: Widowed  Intimate Partner Violence: Not At Risk (04/19/2023)    Received from [facility]    Humiliation, Afraid, Rape, and Kick questionnaire     Fear of Current or Ex-Partner: No     Emotionally Abused: No     Physically Abused: No     Sexually Abused: No             Family History  Problem Relation Age of Onset   Diabetes Sister     Hypertension Sister     Breast cancer Sister 12   Diabetes Brother     Breast cancer Maternal  Grandmother 34   Cervical cancer Mother     Prostate cancer Father 45        metastatic to bones   Cancer Maternal Aunt          type unk dx <50   Skin cancer Paternal Uncle     Pulmonary embolism Maternal Grandfather 62   Cancer Paternal Uncle     Breast cancer Other     Stomach cancer Other            Review of Systems  Constitutional: Negative.   HENT: Negative.    Respiratory: Negative.    Cardiovascular: Negative.   Musculoskeletal:        Left foot pain  Skin: Negative.   Neurological: Negative.   Endo/Heme/Allergies: Negative.   Psychiatric/Behavioral: Negative.            Physical Examination      Vitals:    10/21/23 0615  BP: (!) 144/92  Pulse: 82  Resp: 18  Temp: 98.1 F (36.7 C)  SpO2: 95%    Body mass index is 18.36 kg/m.   Physical Exam HENT:     Nose: Nose normal.  Eyes:     Pupils: Pupils are equal, round, and reactive to light.  Cardiovascular:     Pulses:          Femoral pulses are 2+ on the right side and 1+ on the left side. Abdominal:     General: Abdomen is  flat.     Palpations: Abdomen is soft.  Skin:    Capillary Refill: Capillary refill takes more than 3 seconds.     Comments: Left foot pictured below  Neurological:     General: No focal deficit present.     Mental Status: She is alert.  Psychiatric:        Mood and Affect: Mood normal.           CBC Labs (Brief)          Component Value Date/Time    WBC 7.6 10/21/2023 0553    RBC 4.77 10/21/2023 0553    HGB 13.5 10/21/2023 0553    HCT 41.9 10/21/2023 0553    PLT 216 10/21/2023 0553    MCV 87.8 10/21/2023 0553    MCH 28.3 10/21/2023 0553    MCHC 32.2 10/21/2023 0553    RDW 13.2 10/21/2023 0553    LYMPHSABS 3.3 02/26/2023 1110    MONOABS 1.3 (H) 02/26/2023 1110    EOSABS 0.3 02/26/2023 1110    BASOSABS 0.1 02/26/2023 1110        BMET Labs (Brief)          Component Value Date/Time    NA 138 10/21/2023 0553    K 4.2 10/21/2023 0553    CL 99  10/21/2023 0553    CO2 29 10/21/2023 0553    GLUCOSE 130 (H) 10/21/2023 0553    BUN 15 10/21/2023 0553    CREATININE 0.70 10/21/2023 0553    CREATININE 0.71 03/09/2014 0916    CALCIUM 9.5 10/21/2023 0553    GFRNONAA >60 10/21/2023 0553    GFRNONAA >89 03/09/2014 0916    GFRAA >89 03/09/2014 0916        COAGS: Recent Labs       Lab Results  Component Value Date    INR 1.0 10/21/2023          Non-Invasive Vascular Imaging:   No new studies   ASSESSMENT/PLAN: This is a 63 y.o. 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  She has a known left common femoral to below-knee popliteal artery bypass.  We discussed proceeding with repeat angiography given a limb threatening nature of her disease process and she demonstrates good understanding.  Patient states that she cannot stay in the hospital today and as such I will have tried to her to arrange angiogram next week.  She can continue aspirin and Plavix periprocedurally and she demonstrates good understanding of the risk and benefits.  The proper discharged and the office will contact her.   Brandon C. Randie Heinz, MD [facility] Office: [phone] Pager: [phone]

## 2023-10-21 NOTE — Op Note (Signed)
 NAME: Martha Lozano    MRN: [id number] DOB: 06-09-1961    DATE OF OPERATION: 10/21/2023  PREOP DIAGNOSIS:    Left lower extremity critical limb ischemia with tissue loss at the great toe  POSTOP DIAGNOSIS:    Same  PROCEDURE:    Left greater saphenous vein harvest Redo exposure of the left common femoral artery, distal external iliac artery Left iliofemoral endarterectomy extended onto the profunda, saphenous vein patch Left femoral to below-knee popliteal artery bypass using nonreversed greater saphenous vein  SURGEON: Victorino Sparrow  ASSIST: Nathanial Rancher, PA  ANESTHESIA: General  EBL: 300 mL  INDICATIONS:    Martha Lozano is a 63 y.o. female with prior history of left-sided common femoral to above-knee popliteal artery bypass for tissue loss in August 2024.  This occluded, but the wound on the foot healed.  She presented to the ED with new onset left-sided rest pain with diagnostic angiography demonstrating occlusion of the left common femoral vein, profunda.  Patient with critical limb ischemia with tissue loss at the left great toe.  After discussing risks and benefits of redo exposure of the left common femoral artery, iliofemoral endarterectomy, left femoral to below-knee popliteal artery bypass, Martha Lozano elected to proceed.  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  General esthesia was induced and patient from representative fashion.  The case began with ultrasound insonation of the left leg greater saphenous vein.  This was marked, and a total of 4 skip incisions were made.  Saphenectomy followed in standard fashion.  Once the saphenous vein was harvested.  I then moved to expose the left below-knee popliteal artery.  This was done through one of the skip  incisions.  The below-knee popliteal artery was controlled with use of vessel loop.  Next, my attention turned to the left common femoral artery.  This was a redo exposure of the artery, and took a considerable amount of time, greater than 30 minutes more than normal in an effort to expose the left distal external iliac artery, common femoral artery, 2 profunda branches, superficial femoral artery.  Once exposed, all branches were controlled with use of Vesseloops.  Next, an 8 mm tunneler was brought onto the field and tunneled in subfascial plane from the below-knee popliteal artery to the left common femoral artery.  The patient was heparinized.  The distal extrailiac artery, common femoral artery, profunda, superficial femoral artery and all other branches were controlled with Vesseloops and vascular clamps, and the artery was opened through a longitudinal arteriotomy.  At the time of opening, the hood of the previous 6 mm ringed PTFE bypass was removed.  A 0 silk was used to oversew the bypass that was occluded.  Next, left-sided iliofemoral endarterectomy followed.  The lesion was very interesting, it appeared to be chronic thrombus.  The arteriotomy was extended onto the profunda.  Next, all profunda branches were backbled to ensure there was no thrombus within the vessels.  A section of the previously harvested greater saphenous vein was transected, splayed, and Solman standard fashion as a patch using 6-0 Prolene suture.  Upon completion, clamps were removed.  There was a small vascular injury in the distal extrailiac artery at the 3 o'clock position where the wall.  Then from endarterectomy.  This was  repaired with a pledgeted suture.  Next, the artery was clamped lower, and the greater saphenous vein conduit brought onto the field.  A longitudinal arteriotomy was made on the previously sewn patch.  The bypass was splayed, and sewn in an inside fashion using running 6-0 Prolene suture.  Next, the common  femoral artery and bypass were pressurized.  A Doppler was brought to the field to ensure that the profunda branches were widely patent.  They were.  A hand-held valvulotome was brought onto the field, and valves lysed under direct visualization.  Next, the vein was placed through the tunneling device, exiting in the below-knee popliteal artery space.  The knee was extended, vein marked, then transected at the appropriate length.  The below-knee popliteal artery was controlled with use of Vesseloops and clamps, a longitudinal arteriotomy made.  The distal portion of the greater saphenous vein was splayed, and sewn in end-to-side fashion using running 6-0 Prolene suture.  At completion, there is an excellent pulse distal to the bypass.  There was a pulse in the foot and both the posterior tibial and dorsalis pedis arteries.  Heparin was reversed.  Incision sites were irrigated with copious amounts of saline.  Loosely was placed in the groin as well as the bony pelvis artery space, and the wound beds were closed in layers using Vicryl suture with Monocryl and Dermabond and the level of the skin.  At completion, Martha Lozano had a palpable dorsalis pedis and posterior tibial artery pulse in the left foot.  Victorino Sparrow, MD [facility] DATE OF DICTATION:   10/21/2023

## 2023-10-22 ENCOUNTER — Encounter (HOSPITAL_COMMUNITY): Payer: Self-pay | Admitting: Vascular Surgery

## 2023-10-22 LAB — BASIC METABOLIC PANEL WITH GFR
Anion gap: 10 (ref 5–15)
BUN: 13 mg/dL (ref 8–23)
CO2: 25 mmol/L (ref 22–32)
Calcium: 8.5 mg/dL — ABNORMAL LOW (ref 8.9–10.3)
Chloride: 103 mmol/L (ref 98–111)
Creatinine, Ser: 0.68 mg/dL (ref 0.44–1.00)
GFR, Estimated: >60 mL/min (ref >60)
Glucose, Bld: 130 mg/dL — ABNORMAL HIGH (ref 70–99)
Potassium: 4 mmol/L (ref 3.5–5.1)
Sodium: 138 mmol/L (ref 135–145)

## 2023-10-22 LAB — LIPID PANEL
Cholesterol: 119 mg/dL (ref 0–200)
HDL: 40 mg/dL — ABNORMAL LOW (ref >40)
LDL Cholesterol: 61 mg/dL (ref 0–99)
Total CHOL/HDL Ratio: 3 ratio
Triglycerides: 92 mg/dL (ref <150)
VLDL: 18 mg/dL (ref 0–40)

## 2023-10-22 LAB — GLUCOSE, CAPILLARY
Glucose-Capillary: 124 mg/dL — ABNORMAL HIGH (ref 70–99)
Glucose-Capillary: 209 mg/dL — ABNORMAL HIGH (ref 70–99)
Glucose-Capillary: 139 mg/dL — ABNORMAL HIGH (ref 70–99)
Glucose-Capillary: 221 mg/dL — ABNORMAL HIGH (ref 70–99)

## 2023-10-22 LAB — CBC
HCT: 27.3 % — ABNORMAL LOW (ref 36.0–46.0)
Hemoglobin: 8.9 g/dL — ABNORMAL LOW (ref 12.0–15.0)
MCH: 28.9 pg (ref 26.0–34.0)
MCHC: 32.6 g/dL (ref 30.0–36.0)
MCV: 88.6 fL (ref 80.0–100.0)
Platelets: 195 10*3/uL (ref 150–400)
RBC: 3.08 MIL/uL — ABNORMAL LOW (ref 3.87–5.11)
RDW: 13.6 % (ref 11.5–15.5)
WBC: 9 10*3/uL (ref 4.0–10.5)
nRBC: 0 % (ref 0.0–0.2)

## 2023-10-22 MED ORDER — CHLORHEXIDINE GLUCONATE CLOTH 2 % EX PADS
6.0000 | MEDICATED_PAD | Freq: Every day | CUTANEOUS | Status: DC
  Administered 2023-10-22: 6 via TOPICAL

## 2023-10-22 MED ORDER — MUPIROCIN 2 % EX OINT
1.0000 | TOPICAL_OINTMENT | Freq: Two times a day (BID) | CUTANEOUS | Status: DC
  Administered 2023-10-22 – 2023-10-23 (×2): 1 via NASAL
  Filled 2023-10-22: qty 22

## 2023-10-22 MED ORDER — HEPARIN SODIUM (PORCINE) 1000 UNIT/ML IJ SOLN
INTRAMUSCULAR | Status: AC
  Filled 2023-10-22: qty 10

## 2023-10-22 NOTE — Anesthesia Postprocedure Evaluation (Signed)
 Anesthesia Post Note  Patient: Martha Lozano  Procedure(s) Performed: LEFT ILIO - FEMORAL ENDARTARECTOMY (Left: Leg Lower) REDO LEFT FEMORAL-BELOW KNEE IPSITATERAL SAPHENOUS BYPASS WITH GREATER SAPHENOUS VEIN HARVEST (Left: Leg Lower)     Patient location during evaluation: PACU Anesthesia Type: General Level of consciousness: awake and alert Pain management: pain level controlled Vital Signs Assessment: post-procedure vital signs reviewed and stable Respiratory status: spontaneous breathing, nonlabored ventilation, respiratory function stable and patient connected to nasal cannula oxygen Cardiovascular status: blood pressure returned to baseline and stable Postop Assessment: no apparent nausea or vomiting Anesthetic complications: no   No notable events documented.  Last Vitals:  Vitals:   10/22/23 0256 10/22/23 0516  BP: 108/68 120/71  Pulse: 88 89  Resp: 17 16  Temp: 36.6 C   SpO2: 99% 97%    Last Pain:  Vitals:   10/22/23 0256  TempSrc: Oral  PainSc:                  [name]

## 2023-10-22 NOTE — Evaluation (Signed)
 Physical Therapy Evaluation Patient Details Name: Martha Lozano MRN: [id number] DOB: 08/11/1960 Today's Date: 10/22/2023  History of Present Illness  63 y.o. female is s/p iliofemoral endarterectomy with redo L femoral to BK pop bypass with GSV on 4/8. PMH: breast cancer, DM, L common femoral to above-knee popliteal artery bypass 8/24  Clinical Impression  Pt admitted with above. Pt sleeping upon arrival but easily awoken, cooperative and gave great effort. Pt with 7/10 pain but able to bring self to EOB and amb 150' with contact guard and use of RW. As expected pt with decreased L LE WBing tolerance due to pain. Educated on L LE ROM to hip, knee, and ankle. Pt returned demonstrated in chair. Anticipate pt to progress well and be able to return home with boyfriend who can be there 24/7. Acute PT to cont to follow however I don't anticipate pt will need follow up PT upon d/c.        If plan is discharge home, recommend the following: A little help with walking and/or transfers;A little help with bathing/dressing/bathroom;Help with stairs or ramp for entrance;Assist for transportation   Can travel by private vehicle        Equipment Recommendations None recommended by PT (pt reports having a RW at home)  Recommendations for Other Services       Functional Status Assessment Patient has had a recent decline in their functional status and demonstrates the ability to make significant improvements in function in a reasonable and predictable amount of time.     Precautions / Restrictions Precautions Precautions: Fall Restrictions Weight Bearing Restrictions Per Provider Order: No      Mobility  Bed Mobility Overal bed mobility: Modified Independent             General bed mobility comments: HOB elevated, use of bed rail, increased time, no physical assist, able to move L LE on own    Transfers Overall transfer level: Needs assistance Equipment used: Rolling walker (2  wheels) Transfers: Sit to/from Stand Sit to Stand: Contact guard assist           General transfer comment: verbal cues for hand placement, contact guard due to first time up    Ambulation/Gait Ambulation/Gait assistance: Contact guard assist Gait Distance (Feet): 150 Feet Assistive device: Rolling walker (2 wheels) Gait Pattern/deviations: Step-to pattern, Step-through pattern, Decreased stride length, Decreased stance time - left Gait velocity: dec Gait velocity interpretation: <1.31 ft/sec, indicative of household ambulator   General Gait Details: initial short step height with step to gait pattern with increased bilat UE support when WBing on L LE progressing to short step length reciprocal gait pattern with less UE support but noted antalgia  Stairs            Wheelchair Mobility     Tilt Bed    Modified Rankin (Stroke Patients Only)       Balance Overall balance assessment: Needs assistance Sitting-balance support: Feet supported, No upper extremity supported Sitting balance-Leahy Scale: Fair Sitting balance - Comments: assist to don L sock due to limited ROM   Standing balance support: Bilateral upper extremity supported, During functional activity, Reliant on assistive device for balance Standing balance-Leahy Scale: Fair Standing balance comment: requires RW for safe ambulation                             Pertinent Vitals/Pain Pain Assessment Pain Assessment: 0-10 Pain Score: 7  Pain Location:  L LE Pain Descriptors / Indicators: Sore Pain Intervention(s): Premedicated before session    Home Living Family/patient expects to be discharged to:: Private residence Living Arrangements: Spouse/significant other Available Help at Discharge: Family;Available 24 hours/day Type of Home: House Home Access: Stairs to enter Entrance Stairs-Rails: Can reach both Entrance Stairs-Number of Steps: 3   Home Layout: One level Home Equipment: Clinical biochemist (2 wheels);BSC/3in1 Additional Comments: Pt lives with boyfriend who can assist 24/7    Prior Function Prior Level of Function : Independent/Modified Independent             Mobility Comments: indep, no AD ADLs Comments: indep     Extremity/Trunk Assessment   Upper Extremity Assessment Upper Extremity Assessment: Overall WFL for tasks assessed    Lower Extremity Assessment Lower Extremity Assessment: LLE deficits/detail LLE Deficits / Details: weaker than R LE due to surgery, limited ankel, knee, hip ROM due to surgery but able to move on own    Cervical / Trunk Assessment Cervical / Trunk Assessment: Normal  Communication   Communication Communication: No apparent difficulties    Cognition Arousal: Alert (pt sleeping upon arrival but easily awoken) Behavior During Therapy: WFL for tasks assessed/performed   PT - Cognitive impairments: No apparent impairments                       PT - Cognition Comments: pt able to follow commands, good initiation and carry over on use of RW Following commands: Intact       Cueing Cueing Techniques: Verbal cues     General Comments General comments (skin integrity, edema, etc.): L LE wrapped in dressings from hip to foot, VSS    Exercises General Exercises - Lower Extremity Ankle Circles/Pumps: AROM, Both, 10 reps, Seated Long Arc Quad: AROM, Left, 10 reps, Seated Hip Flexion/Marching: AROM, Both, 10 reps, Seated   Assessment/Plan    PT Assessment Patient needs continued PT services  PT Problem List Decreased strength;Decreased range of motion;Decreased activity tolerance;Decreased balance;Decreased mobility;Pain       PT Treatment Interventions DME instruction;Gait training;Stair training;Functional mobility training;Therapeutic activities;Therapeutic exercise;Balance training    PT Goals (Current goals can be found in the Care Plan section)  Acute Rehab PT Goals Patient Stated Goal: get better PT  Goal Formulation: With patient Time For Goal Achievement: 11/05/23 Potential to Achieve Goals: Good    Frequency Min 2X/week     Co-evaluation               AM-PAC PT "6 Clicks" Mobility  Outcome Measure Help needed turning from your back to your side while in a flat bed without using bedrails?: None Help needed moving from lying on your back to sitting on the side of a flat bed without using bedrails?: None Help needed moving to and from a bed to a chair (including a wheelchair)?: A Little Help needed standing up from a chair using your arms (e.g., wheelchair or bedside chair)?: A Little Help needed to walk in hospital room?: A Little Help needed climbing 3-5 steps with a railing? : A Little 6 Click Score: 20    End of Session Equipment Utilized During Treatment: Gait belt Activity Tolerance: Patient tolerated treatment well Patient left: in chair;with call bell/phone within reach;with chair alarm set Nurse Communication: Mobility status (telephone doesn't work) PT Visit Diagnosis: Unsteadiness on feet (R26.81);Pain Pain - Right/Left: Left Pain - part of body: Leg    Time: 4401-0272 PT Time Calculation (min) (ACUTE ONLY):  27 min   Charges:   PT Evaluation $PT Eval Moderate Complexity: 1 Mod PT Treatments $Gait Training: 8-22 mins PT General Charges $$ ACUTE PT VISIT: 1 Visit         Lewis Shock, PT, DPT Acute Rehabilitation Services Secure chat preferred Office #: [phone]   Iona Hansen 10/22/2023, 12:13 PM

## 2023-10-22 NOTE — Progress Notes (Signed)
 PHARMACIST LIPID MONITORING   Martha Lozano is a 63 y.o. female admitted on 10/21/2023 with Left lower extremity critical limb ischemia.  Pharmacy has been consulted to optimize lipid-lowering therapy with the indication of secondary prevention for clinical ASCVD.  Recent Labs:  Lipid Panel (last 6 months):   Lab Results  Component Value Date   CHOL 119 10/22/2023   TRIG 92 10/22/2023   HDL 40 (L) 10/22/2023   CHOLHDL 3.0 10/22/2023   VLDL 18 10/22/2023   LDLCALC 61 10/22/2023    Hepatic function panel (last 6 months):   Lab Results  Component Value Date   AST 21 10/21/2023   ALT 19 10/21/2023   ALKPHOS 87 10/21/2023   BILITOT 0.4 10/21/2023    SCr (since admission):   Serum creatinine: 0.68 mg/dL 16/10/96 0454 Estimated creatinine clearance: 49 mL/min  Current therapy and lipid therapy tolerance Current lipid-lowering therapy: Lipitor 40 mg daily Previous lipid-lowering therapies (if applicable): patient had not been taking previously prescribed Lipitor Documented or reported allergies or intolerances to lipid-lowering therapies (if applicable): none  Assessment:   Patient agrees with changes to lipid-lowering therapy  Plan:    1.Statin intensity (high intensity recommended for all patients regardless of the LDL):  Add or increase statin to high intensity.  2.Add ezetimibe (if any one of the following):   Not indicated at this time.  3.Refer to lipid clinic:   No  4.Follow-up with:  Primary care provider  5.Follow-up labs after discharge:  Changes in lipid therapy were made. Check a lipid panel in 8-12 weeks then annually.      Thank you for allowing pharmacy to be a part of this patient's care.   Signe Colt, PharmD 10/22/2023 12:45 PM  **Pharmacist phone directory can be found on [email or web address] listed under [facility]**

## 2023-10-22 NOTE — Progress Notes (Addendum)
  Progress Note    10/22/2023 8:21 AM 1 Day Post-Op  Subjective:  no complaints   Vitals:   10/22/23 0256 10/22/23 0516  BP: 108/68 120/71  Pulse: 88 89  Resp: 17 16  Temp: 97.9 F (36.6 C)   SpO2: 99% 97%   Physical Exam: Lungs:  non labored Incisions:  L groin and proximal thigh incision well appearing Extremities:  palpable L DP; calf soft, no breakthrough bleeding through dressing Neurologic: A&O  CBC    Component Value Date/Time   WBC 9.0 10/22/2023 0500   RBC 3.08 (L) 10/22/2023 0500   HGB 8.9 (L) 10/22/2023 0500   HCT 27.3 (L) 10/22/2023 0500   PLT 195 10/22/2023 0500   MCV 88.6 10/22/2023 0500   MCH 28.9 10/22/2023 0500   MCHC 32.6 10/22/2023 0500   RDW 13.6 10/22/2023 0500   LYMPHSABS 3.3 02/26/2023 1110   MONOABS 1.3 (H) 02/26/2023 1110   EOSABS 0.3 02/26/2023 1110   BASOSABS 0.1 02/26/2023 1110    BMET    Component Value Date/Time   NA 138 10/22/2023 0500   K 4.0 10/22/2023 0500   CL 103 10/22/2023 0500   CO2 25 10/22/2023 0500   GLUCOSE 130 (H) 10/22/2023 0500   BUN 13 10/22/2023 0500   CREATININE 0.68 10/22/2023 0500   CREATININE 0.71 03/09/2014 0916   CALCIUM 8.5 (L) 10/22/2023 0500   GFRNONAA >60 10/22/2023 0500   GFRNONAA >89 03/09/2014 0916   GFRAA >89 03/09/2014 0916    INR    Component Value Date/Time   INR 1.0 10/21/2023 0553     Intake/Output Summary (Last 24 hours) at 10/22/2023 0821 Last data filed at 10/22/2023 0606 Gross per 24 hour  Intake 2713.16 ml  Output 1850 ml  Net 863.16 ml     Assessment/Plan:  63 y.o. female is s/p iliofemoral endarterectomy with redo L femoral to BK pop bypass with GSV 1 Day Post-Op   Well perfused with palpable L DP pulse Groin and proximal thigh incisions well appearing; VVS will take down dressing tomorrow OOB with therapy team today Continue aspirin, plavix, statin Home when mobility improved and pain controlled   Emilie Rutter, PA-C [facility] [phone] 10/22/2023 8:21 AM  VASCULAR STAFF ADDENDUM: I have independently interviewed and examined the patient. I agree with the above.  Palpable pulse.  Wean narcotics as able - somnolent this morning.  Groin soft.  OOB today with nursing Pt and OT    Victorino Sparrow MD [facility] Phone Number: [phone] 10/22/2023 10:26 AM

## 2023-10-22 NOTE — Evaluation (Signed)
 Occupational Therapy Evaluation Patient Details Name: Martha Lozano MRN: [id number] DOB: [birth year] Today's Date: 10/22/2023   History of Present Illness   63 y.o. female is s/p iliofemoral endarterectomy with redo L femoral to BK pop bypass with GSV on 4/8. PMH: breast cancer, DM, L common femoral to above-knee popliteal artery bypass 8/24     Clinical Impressions Patient admitted for the procedure above.  PTA she lives at home, and has needed assist via SO.  Pain to L leg is the primary deficit.  Currently needing CGA for in room mobility/toileting and Min A for lower body ADL.  OT will follow in the acute setting to address deficits, and no post acute OT is anticipated.  Encourage OOB and mobility.       If plan is discharge home, recommend the following:   Assist for transportation;Assistance with cooking/housework     Functional Status Assessment   Patient has had a recent decline in their functional status and demonstrates the ability to make significant improvements in function in a reasonable and predictable amount of time.     Equipment Recommendations   BSC/3in1     Recommendations for Other Services         Precautions/Restrictions   Precautions Precautions: Fall Restrictions Weight Bearing Restrictions Per Provider Order: No     Mobility Bed Mobility Overal bed mobility: Modified Independent                  Transfers Overall transfer level: Needs assistance Equipment used: Rolling walker (2 wheels) Transfers: Sit to/from Stand Sit to Stand: Contact guard assist                  Balance Overall balance assessment: Needs assistance Sitting-balance support: Feet supported, No upper extremity supported Sitting balance-Leahy Scale: Fair     Standing balance support: Bilateral upper extremity supported, During functional activity, Reliant on assistive device for balance Standing balance-Leahy Scale: Fair Standing  balance comment: requires RW for safe ambulation                           ADL either performed or assessed with clinical judgement   ADL       Grooming: Wash/dry hands;Wash/dry face;Contact guard assist;Standing               Lower Body Dressing: Minimal assistance;Sit to/from stand   Toilet Transfer: Contact guard assist;Regular Toilet;Rolling walker (2 wheels);Ambulation             General ADL Comments: L knee buckling     Vision Patient Visual Report: No change from baseline       Perception Perception: Not tested       Praxis Praxis: Not tested       Pertinent Vitals/Pain Pain Assessment Pain Assessment: Faces Faces Pain Scale: Hurts little more Pain Location: L LE Pain Descriptors / Indicators: Tender Pain Intervention(s): Monitored during session     Extremity/Trunk Assessment Upper Extremity Assessment Upper Extremity Assessment: Overall WFL for tasks assessed   Lower Extremity Assessment Lower Extremity Assessment: Defer to PT evaluation LLE Deficits / Details: weaker than R LE due to surgery, limited ankel, knee, hip ROM due to surgery but able to move on own   Cervical / Trunk Assessment Cervical / Trunk Assessment: Normal   Communication Communication Communication: No apparent difficulties   Cognition Arousal: Alert Behavior During Therapy: WFL for tasks assessed/performed, Flat affect Cognition: No apparent impairments  Following commands: Intact       Cueing  General Comments   Cueing Techniques: Verbal cues  L LE wrapped in dressings from hip to foot, VSS   Exercises     Shoulder Instructions      Home Living Family/patient expects to be discharged to:: Private residence Living Arrangements: Spouse/significant other Available Help at Discharge: Family;Available 24 hours/day Type of Home: House Home Access: Stairs to enter [employer] of Steps:  3 Entrance Stairs-Rails: Can reach both Home Layout: One level     Bathroom Shower/Tub: Chief Strategy Officer: Standard Bathroom Accessibility: Yes How Accessible: Accessible via walker Home Equipment: Agricultural consultant (2 wheels);BSC/3in1   Additional Comments: Pt lives with boyfriend who can assist 24/7      Prior Functioning/Environment Prior Level of Function : Independent/Modified Independent             Mobility Comments: indep, no AD ADLs Comments: indep    OT Problem List: Decreased strength;Impaired balance (sitting and/or standing);Pain;Decreased range of motion   OT Treatment/Interventions: Self-care/ADL training;Therapeutic activities;DME and/or AE instruction;Patient/family education;Balance training      OT Goals(Current goals can be found in the care plan section)   Acute Rehab OT Goals Patient Stated Goal: Return home OT Goal Formulation: With patient Time For Goal Achievement: 11/05/23 Potential to Achieve Goals: Good ADL Goals Pt Will Perform Grooming: with modified independence;standing Pt Will Perform Lower Body Dressing: with modified independence;sit to/from stand Pt Will Transfer to Toilet: with modified independence;regular height toilet;ambulating   OT Frequency:  Min 2X/week    Co-evaluation              AM-PAC OT "6 Clicks" Daily Activity     Outcome Measure Help from another person eating meals?: None Help from another person taking care of personal grooming?: A Little Help from another person toileting, which includes using toliet, bedpan, or urinal?: A Little Help from another person bathing (including washing, rinsing, drying)?: A Little Help from another person to put on and taking off regular upper body clothing?: None Help from another person to put on and taking off regular lower body clothing?: A Little 6 Click Score: 20   End of Session Equipment Utilized During Treatment: Rolling walker (2 wheels) Nurse  Communication: Mobility status  Activity Tolerance: Patient tolerated treatment well Patient left: in bed;with call bell/phone within reach;with bed alarm set  OT Visit Diagnosis: Unsteadiness on feet (R26.81);Pain Pain - Right/Left: Left Pain - part of body: Leg                Time: 8295-6213 OT Time Calculation (min): 22 min Charges:  OT General Charges $OT Visit: 1 Visit OT Evaluation $OT Eval Moderate Complexity: 1 Mod  10/22/2023  RP, OTR/L  Acute Rehabilitation Services  Office:  [phone]   Suzanna Obey 10/22/2023, 1:27 PM

## 2023-10-23 ENCOUNTER — Other Ambulatory Visit (HOSPITAL_COMMUNITY): Payer: Self-pay

## 2023-10-23 LAB — GLUCOSE, CAPILLARY: Glucose-Capillary: 155 mg/dL — ABNORMAL HIGH (ref 70–99)

## 2023-10-23 MED ORDER — HYDROCODONE-ACETAMINOPHEN 5-325 MG PO TABS
1.0000 | ORAL_TABLET | Freq: Four times a day (QID) | ORAL | 0 refills | Status: AC | PRN
  Filled 2023-10-23: qty 20, 5d supply, fill #0

## 2023-10-23 NOTE — Progress Notes (Signed)
 Physical Therapy Treatment Patient Details Name: Martha Lozano MRN: [id number] DOB: [birth year] Today's Date: 10/23/2023   History of Present Illness 63 y.o. female is s/p iliofemoral endarterectomy with redo L femoral to BK pop bypass with GSV on 4/8. PMH: breast cancer, DM, L common femoral to above-knee popliteal artery bypass 8/24    PT Comments  Pt resting in bed on arrival and agreeable to session with good progress towards acute goals. Pt demonstrating gait with RW support for increased distance with supervision for safety. Cues for increased heel strike and symmetrical stride length with pt demonstrating good carryover. Pt was educated on continued walker use to maximize functional independence, safety, and decrease risk for falls as well as appropriate activity progression and importance of continued mobility with pt verbalizing understanding. Pt continues to benefit from skilled PT services to progress toward functional mobility goals.      If plan is discharge home, recommend the following: A little help with walking and/or transfers;A little help with bathing/dressing/bathroom;Help with stairs or ramp for entrance;Assist for transportation   Can travel by private vehicle        Equipment Recommendations  None recommended by PT (pt reports having a RW at home)    Recommendations for Other Services       Precautions / Restrictions Precautions Precautions: Fall Restrictions Weight Bearing Restrictions Per Provider Order: No     Mobility  Bed Mobility Overal bed mobility: Modified Independent             General bed mobility comments: HOB elevated, use of bed rail, increased time, no physical assist, able to move L LE on own    Transfers Overall transfer level: Needs assistance Equipment used: Rolling walker (2 wheels) Transfers: Sit to/from Stand Sit to Stand: Supervision           General transfer comment: verbal cues for hand placement     Ambulation/Gait Ambulation/Gait assistance: Supervision Gait Distance (Feet): 185 Feet Assistive device: Rolling walker (2 wheels) Gait Pattern/deviations: Step-to pattern, Step-through pattern, Decreased stride length, Decreased stance time - left Gait velocity: dec     General Gait Details: cues for more symmetrial stride lengths with focus on increased heel strike on L with good carryover, HR up to 133bpm with gait   Stairs             Wheelchair Mobility     Tilt Bed    Modified Rankin (Stroke Patients Only)       Balance Overall balance assessment: Needs assistance Sitting-balance support: Feet supported, No upper extremity supported Sitting balance-Leahy Scale: Fair Sitting balance - Comments: assist to don L sock due to limited ROM   Standing balance support: Bilateral upper extremity supported, During functional activity, Reliant on assistive device for balance Standing balance-Leahy Scale: Fair Standing balance comment: requires RW for safe ambulation                            Communication Communication Communication: No apparent difficulties  Cognition Arousal: Alert Behavior During Therapy: WFL for tasks assessed/performed, Flat affect   PT - Cognitive impairments: No apparent impairments                         Following commands: Intact      Cueing Cueing Techniques: Verbal cues  Exercises General Exercises - Lower Extremity Ankle Circles/Pumps: AROM, Both, 10 reps, Supine Heel Raises: AROM, Left, 10 reps,  Seated Mini-Sqauts: AROM, Left, 10 reps, Seated Other Exercises Other Exercises: static sustained heel cord stretch with towel on L    General Comments        Pertinent Vitals/Pain Pain Assessment Pain Assessment: Faces Faces Pain Scale: Hurts little more Pain Location: L LE Pain Descriptors / Indicators: Tender Pain Intervention(s): Monitored during session, Limited activity within patient's tolerance     Home Living                          Prior Function            PT Goals (current goals can now be found in the care plan section) Acute Rehab PT Goals Patient Stated Goal: get better PT Goal Formulation: With patient Time For Goal Achievement: 11/05/23 Progress towards PT goals: Progressing toward goals    Frequency    Min 2X/week      PT Plan      Co-evaluation              AM-PAC PT "6 Clicks" Mobility   Outcome Measure  Help needed turning from your back to your side while in a flat bed without using bedrails?: None Help needed moving from lying on your back to sitting on the side of a flat bed without using bedrails?: None Help needed moving to and from a bed to a chair (including a wheelchair)?: A Little Help needed standing up from a chair using your arms (e.g., wheelchair or bedside chair)?: A Little Help needed to walk in hospital room?: A Little Help needed climbing 3-5 steps with a railing? : A Little 6 Click Score: 20    End of Session Equipment Utilized During Treatment: Gait belt Activity Tolerance: Patient tolerated treatment well Patient left: with call bell/phone within reach;in bed Nurse Communication: Mobility status PT Visit Diagnosis: Unsteadiness on feet (R26.81);Pain Pain - Right/Left: Left Pain - part of body: Leg     Time: 0852-0904 PT Time Calculation (min) (ACUTE ONLY): 12 min  Charges:    $Gait Training: 8-22 mins PT General Charges $$ ACUTE PT VISIT: 1 Visit                     Keena Dinse R. PTA Acute Rehabilitation Services Office: [phone]   Catalina Antigua 10/23/2023, 9:14 AM

## 2023-10-23 NOTE — Progress Notes (Addendum)
  Progress Note    10/23/2023 8:10 AM 2 Days Post-Op  Subjective:  wants to go home   Vitals:   10/23/23 0543 10/23/23 0755  BP: 119/73   Pulse: 98 86  Resp: 14   Temp:  98.3 F (36.8 C)  SpO2:     Physical Exam Lungs:  non labored Incisions:  L LE incisions are well appearing without drainage or hematoma Extremities:  palpable L DP pulse Neurologic: A&O  CBC    Component Value Date/Time   WBC 9.0 10/22/2023 0500   RBC 3.08 (L) 10/22/2023 0500   HGB 8.9 (L) 10/22/2023 0500   HCT 27.3 (L) 10/22/2023 0500   PLT 195 10/22/2023 0500   MCV 88.6 10/22/2023 0500   MCH 28.9 10/22/2023 0500   MCHC 32.6 10/22/2023 0500   RDW 13.6 10/22/2023 0500   LYMPHSABS 3.3 02/26/2023 1110   MONOABS 1.3 (H) 02/26/2023 1110   EOSABS 0.3 02/26/2023 1110   BASOSABS 0.1 02/26/2023 1110    BMET    Component Value Date/Time   NA 138 10/22/2023 0500   K 4.0 10/22/2023 0500   CL 103 10/22/2023 0500   CO2 25 10/22/2023 0500   GLUCOSE 130 (H) 10/22/2023 0500   BUN 13 10/22/2023 0500   CREATININE 0.68 10/22/2023 0500   CREATININE 0.71 03/09/2014 0916   CALCIUM 8.5 (L) 10/22/2023 0500   GFRNONAA >60 10/22/2023 0500   GFRNONAA >89 03/09/2014 0916   GFRAA >89 03/09/2014 0916    INR    Component Value Date/Time   INR 1.0 10/21/2023 0553    No intake or output data in the 24 hours ending 10/23/23 0810   Assessment/Plan:  63 y.o. female is s/p  iliofemoral endarterectomy with redo L femoral to BK pop bypass with GSV   2 Days Post-Op   Well perfused L foot with palpable DP pulse Incisions are well appearing No HH recommendations based on therapy eval Continue aspirn, plavix, statin Ok for discharge home today; office will arrange follow up in 2-3 weeks   Emilie Rutter, PA-C [facility] [phone] 10/23/2023 8:10 AM   VASCULAR STAFF ADDENDUM: I have independently interviewed and examined the patient. I agree with the above.  Home today with TED hose to  the left leg  Victorino Sparrow MD [facility] Phone Number: [phone] 10/23/2023 8:22 AM

## 2023-10-23 NOTE — TOC Transition Note (Signed)
 Transition of Care [facility]) - Discharge Note Donn Pierini RN, BSN Transitions of Care Unit 4E- RN Case Manager See Treatment Team for direct phone #   Patient Details  Name: Martha Lozano MRN: [id number] Date of Birth: Feb 06, 1961  [facility] CM/SW Contact:  Darrold Span, RN Phone Number: 10/23/2023, 10:12 AM   Clinical Narrative:    Pt stable for transition home today s/p iliofemoral endarterectomy with redo fempop.  Home with boyfriend. No HH or DME needs noted per therapy eval.   Pt has someone to transport her home.    Final next level of care: Home/Self Care Barriers to Discharge: No Barriers Identified   Patient Goals and CMS Choice    Pt wants to return home no HH/DME needs        Discharge Placement               Home        Discharge Plan and Services Additional resources added to the After Visit Summary for   In-house Referral: NA Discharge Planning Services: NA Post Acute Care Choice: NA          DME Arranged: N/A DME Agency: NA       HH Arranged: NA HH Agency: NA        Social Drivers of Health (SDOH) Interventions SDOH Screenings   Food Insecurity: No Food Insecurity (04/19/2023)   [facility]  Housing: Low Risk  (02/26/2023)  Transportation Needs: No Transportation Needs (04/19/2023)   Received from [facility]  Utilities: Low Risk  (04/19/2023)   Received from [facility]  Financial Resource Strain: Low Risk  (04/19/2023)   Received from [facility]  Physical Activity: Insufficiently Active (04/19/2023)   Received from [facility]  Social Connections: Moderately Integrated (04/19/2023)   Received from [facility]  Stress: No Stress Concern Present (04/19/2023)   Received from [facility]  Tobacco Use: High Risk (10/21/2023)  Health Literacy: Low Risk  (04/19/2023)   Received from [facility]     Readmission Risk Interventions    10/23/2023   10:12 AM  03/07/2023   11:41 AM 02/28/2023    3:07 PM  Readmission Risk Prevention Plan  Transportation Screening Complete Complete Complete  PCP or Specialist Appt within 3-5 Days   Complete  Home Care Screening Complete    Medication Review (RN CM) Complete    HRI or Home Care Consult   Complete  Social Work Consult for Recovery Care Planning/Counseling   Complete  Palliative Care Screening   Complete  Medication Review [employer])  Complete Complete  HRI or Home Care Consult  Complete   SW Recovery Care/Counseling Consult  Complete   Palliative Care Screening  Not Applicable   Skilled Nursing Facility  Not Applicable

## 2023-10-23 NOTE — Progress Notes (Signed)
 TED nose placed per MD order without difficulty.

## 2023-10-23 NOTE — Progress Notes (Signed)
 Patient given discharge instructions medication list and follow up appointments. IV and tele were dcd. TOC medications are ready for pick up. Awaiting TED hose to arrive then Will discharge home as ordered. Mahlik Lenn, Randall An RN

## 2023-10-24 ENCOUNTER — Telehealth: Payer: Self-pay

## 2023-10-24 ENCOUNTER — Telehealth: Payer: Self-pay | Admitting: Vascular Surgery

## 2023-10-24 NOTE — Telephone Encounter (Signed)
 Advice: -pt LM asking about what to do with her groin access site and guaze. -advised pt to take bandage off from Monday procedure, wash with antibacterial soap daily and pat dry (ensure no scrubbing of area).  May keep dry with a piece guaze to wick moisture from area

## 2023-10-24 NOTE — Discharge Summary (Signed)
 Bypass Discharge Summary Patient ID: Martha Lozano [id number] 63 y.o. Jun 29, 1961  Admit date: 10/21/2023  Discharge date and time: 10/23/2023 10:22 AM   Admitting Physician: Victorino Sparrow, MD   Discharge Physician: same  Admission Diagnoses: Critical limb ischemia of left lower extremity (HCC) [I70.222] Atherosclerosis of artery of extremity with ulceration (HCC) [I70.299, L97.909]  Discharge Diagnoses: same  Admission Condition: fair  Discharged Condition: fair  Indication for Admission: post op care after bypass surgery  Hospital Course: [name] is a 63 year old female with critical limb ischemia of the left lower extremity with tissue loss of the great toe.  She was brought in as an outpatient by Dr. Karin Lieu on 10/21/2023 and underwent iliofemoral endarterectomy with vein patch angioplasty and redo femoral to below the knee popliteal bypass with vein.  She tolerated the procedure well and was admitted to the hospital postoperatively.  Hospital stay was uneventful.  She was weaned from IV pain medication.  Mobility improved over the course of her hospital stay.  There were no recommendations for home health based on evaluations by therapy teams.  She will continue her aspirin, Plavix, statin daily.  She was prescribed 1 to 2 days of narcotic pain medication for continued postoperative pain control.  She will follow-up in the office in 2 to 3 weeks.  It should also be noted that she had a palpable left DP pulse throughout her hospital stay.  She was discharged home in stable condition.  Consults: None  Treatments: surgery: Iliofemoral endarterectomy with vein patch and redo femoral to below the knee popliteal bypass with vein by Dr. Karin Lieu on 10/21/2023    Disposition: Discharge disposition: 01-Home or Self Care       - For [facility] Registry use ---  Post-op:  Wound infection: No  Graft infection: No  Transfusion: No  New Arrhythmia: No Patency judged by:  [ ]  Dopper only, [ ]  Palpable graft pulse, [ x] Palpable distal pulse, [ ]  ABI inc. > 0.15, [ ]  Duplex D/C Ambulatory Status: Ambulatory  Complications: MI: [ x] No, [ ]  Troponin only, [ ]  EKG or Clinical CHF: No Resp failure: [x ] none, [ ]  Pneumonia, [ ]  Ventilator Chg in renal function: [x ] none, [ ]  Inc. Cr > 0.5, [ ]  Temp. Dialysis, [ ]  Permanent dialysis Stroke: [x ] None, [ ]  Minor, [ ]  Major Return to OR: No  Reason for return to OR: [ ]  Bleeding, [ ]  Infection, [ ]  Thrombosis, [ ]  Revision  Discharge medications: Statin use:  Yes ASA use:  Yes Plavix use:  Yes Beta blocker use: No  for medical reason not indicated Coumadin use: No  for medical reason not indicated    Patient Instructions:  Allergies as of 10/23/2023   No Known Allergies      Medication List     TAKE these medications    aspirin EC 81 MG tablet Take 1 tablet (81 mg total) by mouth daily. Swallow whole.   atorvastatin 40 MG tablet Commonly known as: LIPITOR Take 1 tablet (40 mg total) by mouth daily.   clopidogrel 75 MG tablet Commonly known as: PLAVIX Take 1 tablet (75 mg total) by mouth daily at 6 (six) AM.   gabapentin 800 MG tablet Commonly known as: NEURONTIN Take 800 mg by mouth 3 (three) times daily.   glipiZIDE 5 MG 24 hr tablet Commonly known as: GLUCOTROL XL Take 1 tablet (5 mg total) by mouth daily with breakfast.  HYDROcodone-acetaminophen 5-325 MG tablet Commonly known as: NORCO/VICODIN Take 1 tablet by mouth every 6 (six) hours as needed for moderate pain (pain score 4-6).   insulin glargine 100 UNIT/ML injection Commonly known as: LANTUS Inject 7 Units into the skin at bedtime.   levothyroxine 100 MCG tablet Commonly known as: SYNTHROID Take 1 tablet (100 mcg total) by mouth daily before breakfast.   losartan 50 MG tablet Commonly known as: COZAAR Take 0.5 tablets (25 mg total) by mouth daily.   metFORMIN 500 MG tablet Commonly known as: GLUCOPHAGE Take 1  tablet (500 mg total) by mouth 2 (two) times daily with a meal.   naloxone 4 MG/0.1ML Liqd nasal spray kit Commonly known as: NARCAN SMARTSIG:Both Nares   Suboxone 12-3 MG Film Generic drug: Buprenorphine HCl-Naloxone HCl Place 1 Film under the tongue 3 (three) times daily.   venlafaxine XR 37.5 MG 24 hr capsule Commonly known as: EFFEXOR-XR Take by mouth.       Activity: activity as tolerated Diet: regular diet Wound Care: keep wound clean and dry  Follow-up with [facility] in 3 weeks.  SignedEmilie Rutter 10/24/2023 9:36 AM

## 2023-10-25 ENCOUNTER — Telehealth: Payer: Self-pay | Admitting: *Deleted

## 2023-10-25 NOTE — Telephone Encounter (Signed)
 Triage call requesting for recent surgery inform to be faxed to her pain clinic .will call pain clinic and address concern

## 2023-10-29 ENCOUNTER — Emergency Department (HOSPITAL_COMMUNITY)

## 2023-10-29 ENCOUNTER — Inpatient Hospital Stay (HOSPITAL_COMMUNITY)
Admission: EM | Admit: 2023-10-29 | Discharge: 2023-11-01 | DRG: 871 | Disposition: A | Discharge status: Home / Self Care | Attending: Family Medicine | Admitting: Family Medicine

## 2023-10-29 ENCOUNTER — Other Ambulatory Visit: Payer: Self-pay

## 2023-10-29 ENCOUNTER — Encounter (HOSPITAL_COMMUNITY): Payer: Self-pay

## 2023-10-29 DIAGNOSIS — E1165 Type 2 diabetes mellitus with hyperglycemia: Secondary | ICD-10-CM | POA: Diagnosis present

## 2023-10-29 DIAGNOSIS — Z853 Personal history of malignant neoplasm of breast: Secondary | ICD-10-CM

## 2023-10-29 DIAGNOSIS — J189 Pneumonia, unspecified organism: Secondary | ICD-10-CM | POA: Diagnosis present

## 2023-10-29 DIAGNOSIS — Z79899 Other long term (current) drug therapy: Secondary | ICD-10-CM

## 2023-10-29 DIAGNOSIS — E039 Hypothyroidism, unspecified: Secondary | ICD-10-CM | POA: Diagnosis present

## 2023-10-29 DIAGNOSIS — Z681 Body mass index (BMI) 19 or less, adult: Secondary | ICD-10-CM

## 2023-10-29 DIAGNOSIS — F1910 Other psychoactive substance abuse, uncomplicated: Secondary | ICD-10-CM | POA: Diagnosis not present

## 2023-10-29 DIAGNOSIS — R4182 Altered mental status, unspecified: Principal | ICD-10-CM

## 2023-10-29 DIAGNOSIS — Z8249 Family history of ischemic heart disease and other diseases of the circulatory system: Secondary | ICD-10-CM | POA: Diagnosis not present

## 2023-10-29 DIAGNOSIS — Z833 Family history of diabetes mellitus: Secondary | ICD-10-CM

## 2023-10-29 DIAGNOSIS — Z800 Family history of malignant neoplasm of digestive organs: Secondary | ICD-10-CM

## 2023-10-29 DIAGNOSIS — E782 Mixed hyperlipidemia: Secondary | ICD-10-CM | POA: Insufficient documentation

## 2023-10-29 DIAGNOSIS — Z7982 Long term (current) use of aspirin: Secondary | ICD-10-CM

## 2023-10-29 DIAGNOSIS — E1151 Type 2 diabetes mellitus with diabetic peripheral angiopathy without gangrene: Secondary | ICD-10-CM | POA: Diagnosis present

## 2023-10-29 DIAGNOSIS — Z79890 Hormone replacement therapy (postmenopausal): Secondary | ICD-10-CM | POA: Diagnosis not present

## 2023-10-29 DIAGNOSIS — A419 Sepsis, unspecified organism: Principal | ICD-10-CM | POA: Diagnosis present

## 2023-10-29 DIAGNOSIS — M7989 Other specified soft tissue disorders: Secondary | ICD-10-CM | POA: Insufficient documentation

## 2023-10-29 DIAGNOSIS — G928 Other toxic encephalopathy: Secondary | ICD-10-CM | POA: Diagnosis present

## 2023-10-29 DIAGNOSIS — J9601 Acute respiratory failure with hypoxia: Secondary | ICD-10-CM | POA: Diagnosis present

## 2023-10-29 DIAGNOSIS — Z7902 Long term (current) use of antithrombotics/antiplatelets: Secondary | ICD-10-CM | POA: Diagnosis not present

## 2023-10-29 DIAGNOSIS — Z808 Family history of malignant neoplasm of other organs or systems: Secondary | ICD-10-CM

## 2023-10-29 DIAGNOSIS — E46 Unspecified protein-calorie malnutrition: Secondary | ICD-10-CM | POA: Diagnosis not present

## 2023-10-29 DIAGNOSIS — E441 Mild protein-calorie malnutrition: Secondary | ICD-10-CM | POA: Diagnosis present

## 2023-10-29 DIAGNOSIS — F1410 Cocaine abuse, uncomplicated: Secondary | ICD-10-CM | POA: Diagnosis present

## 2023-10-29 DIAGNOSIS — D638 Anemia in other chronic diseases classified elsewhere: Secondary | ICD-10-CM | POA: Diagnosis present

## 2023-10-29 DIAGNOSIS — Z8049 Family history of malignant neoplasm of other genital organs: Secondary | ICD-10-CM | POA: Diagnosis not present

## 2023-10-29 DIAGNOSIS — Z79810 Long term (current) use of selective estrogen receptor modulators (SERMs): Secondary | ICD-10-CM

## 2023-10-29 DIAGNOSIS — I10 Essential (primary) hypertension: Secondary | ICD-10-CM | POA: Diagnosis present

## 2023-10-29 DIAGNOSIS — Z7984 Long term (current) use of oral hypoglycemic drugs: Secondary | ICD-10-CM

## 2023-10-29 DIAGNOSIS — Z803 Family history of malignant neoplasm of breast: Secondary | ICD-10-CM

## 2023-10-29 DIAGNOSIS — Z794 Long term (current) use of insulin: Secondary | ICD-10-CM | POA: Diagnosis not present

## 2023-10-29 DIAGNOSIS — E8809 Other disorders of plasma-protein metabolism, not elsewhere classified: Secondary | ICD-10-CM | POA: Diagnosis present

## 2023-10-29 DIAGNOSIS — N390 Urinary tract infection, site not specified: Secondary | ICD-10-CM | POA: Diagnosis present

## 2023-10-29 DIAGNOSIS — F17210 Nicotine dependence, cigarettes, uncomplicated: Secondary | ICD-10-CM | POA: Diagnosis present

## 2023-10-29 LAB — BLOOD GAS, VENOUS
Acid-Base Excess: 6.2 mmol/L — ABNORMAL HIGH (ref 0.0–2.0)
Bicarbonate: 32.1 mmol/L — ABNORMAL HIGH (ref 20.0–28.0)
Drawn by: 72095
O2 Saturation: 70.9 %
Patient temperature: 37.6
pCO2, Ven: 54 mmHg (ref 44–60)
pH, Ven: 7.38 (ref 7.25–7.43)
pO2, Ven: 43 mmHg (ref 32–45)

## 2023-10-29 LAB — RAPID URINE DRUG SCREEN, HOSP PERFORMED
Amphetamines: NOT DETECTED
Barbiturates: NOT DETECTED
Benzodiazepines: NOT DETECTED
Cocaine: POSITIVE — AB
Opiates: NOT DETECTED
Tetrahydrocannabinol: NOT DETECTED

## 2023-10-29 LAB — COMPREHENSIVE METABOLIC PANEL WITH GFR
ALT: 12 U/L (ref 0–44)
AST: 21 U/L (ref 15–41)
Albumin: 3.1 g/dL — ABNORMAL LOW (ref 3.5–5.0)
Alkaline Phosphatase: 64 U/L (ref 38–126)
Anion gap: 9 (ref 5–15)
BUN: 14 mg/dL (ref 8–23)
CO2: 23 mmol/L (ref 22–32)
Calcium: 8.3 mg/dL — ABNORMAL LOW (ref 8.9–10.3)
Chloride: 101 mmol/L (ref 98–111)
Creatinine, Ser: 0.6 mg/dL (ref 0.44–1.00)
GFR, Estimated: >60 mL/min (ref >60)
Glucose, Bld: 181 mg/dL — ABNORMAL HIGH (ref 70–99)
Potassium: 3.8 mmol/L (ref 3.5–5.1)
Sodium: 133 mmol/L — ABNORMAL LOW (ref 135–145)
Total Bilirubin: 0.4 mg/dL (ref 0.0–1.2)
Total Protein: 6.2 g/dL — ABNORMAL LOW (ref 6.5–8.1)

## 2023-10-29 LAB — GLUCOSE, CAPILLARY: Glucose-Capillary: 126 mg/dL — ABNORMAL HIGH (ref 70–99)

## 2023-10-29 LAB — URINALYSIS, W/ REFLEX TO CULTURE (INFECTION SUSPECTED)
Bilirubin Urine: NEGATIVE
Glucose, UA: >=500 mg/dL — AB
Hgb urine dipstick: NEGATIVE
Ketones, ur: NEGATIVE mg/dL
Nitrite: NEGATIVE
Protein, ur: NEGATIVE mg/dL
Specific Gravity, Urine: 1.017 (ref 1.005–1.030)
Squamous Epithelial / HPF: >50 /HPF (ref 0–5)
pH: 5 (ref 5.0–8.0)

## 2023-10-29 LAB — CBC WITH DIFFERENTIAL/PLATELET
Abs Immature Granulocytes: 0.04 10*3/uL (ref 0.00–0.07)
Basophils Absolute: 0 10*3/uL (ref 0.0–0.1)
Basophils Relative: 0 %
Eosinophils Absolute: 0 10*3/uL (ref 0.0–0.5)
Eosinophils Relative: 0 %
HCT: 28.9 % — ABNORMAL LOW (ref 36.0–46.0)
Hemoglobin: 8.9 g/dL — ABNORMAL LOW (ref 12.0–15.0)
Immature Granulocytes: 0 %
Lymphocytes Relative: 6 %
Lymphs Abs: 0.8 10*3/uL (ref 0.7–4.0)
MCH: 28.3 pg (ref 26.0–34.0)
MCHC: 30.8 g/dL (ref 30.0–36.0)
MCV: 91.7 fL (ref 80.0–100.0)
Monocytes Absolute: 0.7 10*3/uL (ref 0.1–1.0)
Monocytes Relative: 5 %
Neutro Abs: 12 10*3/uL — ABNORMAL HIGH (ref 1.7–7.7)
Neutrophils Relative %: 89 %
Platelets: 214 10*3/uL (ref 150–400)
RBC: 3.15 MIL/uL — ABNORMAL LOW (ref 3.87–5.11)
RDW: 13.9 % (ref 11.5–15.5)
WBC: 13.5 10*3/uL — ABNORMAL HIGH (ref 4.0–10.5)
nRBC: 0 % (ref 0.0–0.2)

## 2023-10-29 LAB — PROTIME-INR
INR: 1.1 (ref 0.8–1.2)
Prothrombin Time: 14.4 s (ref 11.4–15.2)

## 2023-10-29 LAB — RESP PANEL BY RT-PCR (RSV, FLU A&B, COVID)  RVPGX2
Influenza A by PCR: NEGATIVE
Influenza B by PCR: NEGATIVE
Resp Syncytial Virus by PCR: NEGATIVE
SARS Coronavirus 2 by RT PCR: NEGATIVE

## 2023-10-29 LAB — AMMONIA: Ammonia: 13 umol/L (ref 9–35)

## 2023-10-29 LAB — LACTIC ACID, PLASMA: Lactic Acid, Venous: 1.4 mmol/L (ref 0.5–1.9)

## 2023-10-29 LAB — TROPONIN I (HIGH SENSITIVITY)
Troponin I (High Sensitivity): 3 ng/L (ref <18)
Troponin I (High Sensitivity): 4 ng/L (ref <18)

## 2023-10-29 LAB — ETHANOL: Alcohol, Ethyl (B): <10 mg/dL (ref <10)

## 2023-10-29 MED ORDER — SODIUM CHLORIDE 0.9 % IV SOLN
3.0000 g | Freq: Four times a day (QID) | INTRAVENOUS | Status: DC
  Administered 2023-10-30 – 2023-11-01 (×8): 3 g via INTRAVENOUS
  Filled 2023-10-29 (×2): qty 8
  Filled 2023-10-29: qty 3
  Filled 2023-10-29 (×5): qty 8
  Filled 2023-10-29: qty 3
  Filled 2023-10-29 (×3): qty 8

## 2023-10-29 MED ORDER — SODIUM CHLORIDE 0.9 % IV BOLUS
1000.0000 mL | Freq: Once | INTRAVENOUS | Status: AC
  Administered 2023-10-29: 1000 mL via INTRAVENOUS

## 2023-10-29 MED ORDER — ACETAMINOPHEN 325 MG PO TABS: 650.0000 mg | ORAL_TABLET | Freq: Four times a day (QID) | ORAL | Status: DC | PRN

## 2023-10-29 MED ORDER — INSULIN ASPART 100 UNIT/ML IJ SOLN
0.0000 [IU] | Freq: Every day | INTRAMUSCULAR | Status: DC
Start: 2023-10-29 — End: 2023-11-01

## 2023-10-29 MED ORDER — SODIUM CHLORIDE 0.9 % IV SOLN
1.0000 g | Freq: Once | INTRAVENOUS | Status: AC
  Administered 2023-10-29: 1 g via INTRAVENOUS
  Filled 2023-10-29: qty 10

## 2023-10-29 MED ORDER — ONDANSETRON HCL 4 MG/2ML IJ SOLN: 4.0000 mg | Freq: Four times a day (QID) | INTRAMUSCULAR | Status: DC | PRN

## 2023-10-29 MED ORDER — DM-GUAIFENESIN ER 30-600 MG PO TB12
1.0000 | ORAL_TABLET | Freq: Two times a day (BID) | ORAL | Status: DC
  Administered 2023-10-29 – 2023-11-01 (×6): 1 via ORAL
  Filled 2023-10-29 (×6): qty 1

## 2023-10-29 MED ORDER — ENOXAPARIN SODIUM 40 MG/0.4ML IJ SOSY
40.0000 mg | PREFILLED_SYRINGE | INTRAMUSCULAR | Status: DC
  Administered 2023-10-29: 40 mg via SUBCUTANEOUS
  Filled 2023-10-29: qty 0.4

## 2023-10-29 MED ORDER — SODIUM CHLORIDE 0.9 % IV SOLN
500.0000 mg | INTRAVENOUS | Status: DC
  Administered 2023-10-30 – 2023-10-31 (×2): 500 mg via INTRAVENOUS
  Filled 2023-10-29 (×3): qty 5

## 2023-10-29 MED ORDER — LACTATED RINGERS IV SOLN: INTRAVENOUS | Status: AC

## 2023-10-29 MED ORDER — CHLORHEXIDINE GLUCONATE CLOTH 2 % EX PADS
6.0000 | MEDICATED_PAD | Freq: Every day | CUTANEOUS | Status: DC
  Administered 2023-10-29 – 2023-11-01 (×4): 6 via TOPICAL

## 2023-10-29 MED ORDER — SODIUM CHLORIDE 0.9 % IV SOLN
500.0000 mg | Freq: Once | INTRAVENOUS | Status: AC
  Administered 2023-10-29: 500 mg via INTRAVENOUS
  Filled 2023-10-29: qty 5

## 2023-10-29 MED ORDER — ONDANSETRON HCL 4 MG PO TABS: 4.0000 mg | ORAL_TABLET | Freq: Four times a day (QID) | ORAL | Status: DC | PRN

## 2023-10-29 MED ORDER — IOHEXOL 350 MG/ML SOLN
75.0000 mL | Freq: Once | INTRAVENOUS | Status: AC | PRN
  Administered 2023-10-29: 75 mL via INTRAVENOUS

## 2023-10-29 MED ORDER — GLUCERNA SHAKE PO LIQD
237.0000 mL | Freq: Three times a day (TID) | ORAL | Status: DC
  Administered 2023-10-30 – 2023-11-01 (×6): 237 mL via ORAL
  Filled 2023-10-29 (×2): qty 237

## 2023-10-29 MED ORDER — SODIUM CHLORIDE 0.9 % IV SOLN: 2.0000 g | INTRAVENOUS | Status: DC

## 2023-10-29 MED ORDER — INSULIN GLARGINE-YFGN 100 UNIT/ML ~~LOC~~ SOLN
5.0000 [IU] | Freq: Every day | SUBCUTANEOUS | Status: DC
  Administered 2023-10-29 – 2023-10-31 (×3): 5 [IU] via SUBCUTANEOUS
  Filled 2023-10-29 (×3): qty 0.05
  Filled 2023-10-29: qty 10
  Filled 2023-10-29: qty 0.05

## 2023-10-29 MED ORDER — ACETAMINOPHEN 650 MG RE SUPP: 650.0000 mg | Freq: Four times a day (QID) | RECTAL | Status: DC | PRN

## 2023-10-29 MED ORDER — INSULIN ASPART 100 UNIT/ML IJ SOLN
0.0000 [IU] | Freq: Three times a day (TID) | INTRAMUSCULAR | Status: DC
  Administered 2023-10-30 – 2023-10-31 (×3): 1 [IU] via SUBCUTANEOUS
  Administered 2023-10-31: 2 [IU] via SUBCUTANEOUS
  Administered 2023-10-31: 3 [IU] via SUBCUTANEOUS

## 2023-10-29 NOTE — ED Triage Notes (Signed)
 Pt BIB ems for AMS throughout the day. Pt had vascular repair done a week ago. Per EMS initial oxygen saturation 79% RA after 15L 100%. Pt lethargic with EMS.

## 2023-10-29 NOTE — ED Notes (Signed)
 Patient's sister updated as to patient's status. Ok'd by patient.

## 2023-10-29 NOTE — ED Provider Notes (Signed)
 [place] EMERGENCY DEPARTMENT AT [facility] Provider Note   CSN: [id number] Arrival date & time: 10/29/23  1457     History {Add pertinent medical, surgical, social history, OB history to HPI:1} Chief Complaint  Patient presents with   Altered Mental Status    Martha Lozano is a 63 y.o. female.  She has a history of substance abuse, diabetes, peripheral vascular disease.  She was just discharged from the hospital after being admitted for critical limb ischemia of left lower extremity and had a endarterectomy and femoral-popliteal bypass.  She was discharged about 6 days ago.  Reportedly has been very lethargic today.  EMS found the saturation 70% on room air and placed on oxygen.  Blood sugar was elevated.  Patient is somnolent arousable denies any current complaints.  The history is provided by the patient and the EMS personnel.  Altered Mental Status Presenting symptoms: partial responsiveness   Most recent episode:  Today Timing:  Constant Progression:  Unchanged Chronicity:  New Context: recent illness        Home Medications Prior to Admission medications   Medication Sig Start Date End Date Taking? Authorizing Provider  aspirin EC 81 MG tablet Take 1 tablet (81 mg total) by mouth daily. Swallow whole. 03/08/23   Glade Lloyd, MD  atorvastatin (LIPITOR) 40 MG tablet Take 1 tablet (40 mg total) by mouth daily. 03/08/23   Glade Lloyd, MD  Buprenorphine HCl-Naloxone HCl (SUBOXONE) 12-3 MG FILM Place 1 Film under the tongue 3 (three) times daily.    [provider]  clopidogrel (PLAVIX) 75 MG tablet Take 1 tablet (75 mg total) by mouth daily at 6 (six) AM. 03/08/23   Glade Lloyd, MD  gabapentin (NEURONTIN) 800 MG tablet Take 800 mg by mouth 3 (three) times daily. 11/22/20   [provider]  glipiZIDE (GLUCOTROL XL) 5 MG 24 hr tablet Take 1 tablet (5 mg total) by mouth daily with breakfast. 11/06/18   Serena Croissant, MD   HYDROcodone-acetaminophen (NORCO/VICODIN) 5-325 MG tablet Take 1 tablet by mouth every 6 (six) hours as needed for moderate pain (pain score 4-6). 10/23/23   Emilie Rutter, PA-C  insulin glargine (LANTUS) 100 UNIT/ML injection Inject 7 Units into the skin at bedtime.    [provider]  levothyroxine (SYNTHROID) 100 MCG tablet Take 1 tablet (100 mcg total) by mouth daily before breakfast. 11/15/20   Serena Croissant, MD  losartan (COZAAR) 50 MG tablet Take 0.5 tablets (25 mg total) by mouth daily. 03/07/23   Glade Lloyd, MD  metFORMIN (GLUCOPHAGE) 500 MG tablet Take 1 tablet (500 mg total) by mouth 2 (two) times daily with a meal. 03/07/23   Glade Lloyd, MD  naloxone [facility]) nasal spray 4 mg/0.1 mL SMARTSIG:Both Nares 11/08/20   [provider]  venlafaxine XR (EFFEXOR-XR) 37.5 MG 24 hr capsule Take by mouth. 11/11/18   [provider]      Allergies    Patient has no known allergies.    Review of Systems   Review of Systems  Unable to perform ROS: Mental status change    Physical Exam Updated Vital Signs Ht 5' (1.524 m)   Wt 42.6 kg   LMP 02/21/2016   SpO2 98%   BMI 18.34 kg/m  Physical Exam Vitals and nursing note reviewed.  Constitutional:      General: She is not in acute distress.    Appearance: Normal appearance. She is well-developed.  HENT:     Head: Normocephalic and  atraumatic.  Eyes:     Conjunctiva/sclera: Conjunctivae normal.     Comments: Pupils are small and sluggish but equal  Cardiovascular:     Rate and Rhythm: Normal rate and regular rhythm.     Heart sounds: No murmur heard. Pulmonary:     Effort: Pulmonary effort is normal. No respiratory distress.     Breath sounds: Normal breath sounds.  Abdominal:     Palpations: Abdomen is soft.     Tenderness: There is no abdominal tenderness. There is no guarding or rebound.  Musculoskeletal:     Cervical back: Neck supple.     Comments: Is a long healing incision down her inner left  leg.  Her distal pulses are intact.  Skin:    General: Skin is warm and dry.     Capillary Refill: Capillary refill takes less than 2 seconds.  Neurological:     General: No focal deficit present.     Mental Status: She is oriented to person, place, and time. She is lethargic.     Sensory: No sensory deficit.     Motor: No weakness.     ED Results / Procedures / Treatments   Labs (all labs ordered are listed, but only abnormal results are displayed) Labs Reviewed - No data to display  EKG None  Radiology No results found.  Procedures Procedures  {Document cardiac monitor, telemetry assessment procedure when appropriate:1}  Medications Ordered in ED Medications  sodium chloride 0.9 % bolus 1,000 mL (has no administration in time range)    ED Course/ Medical Decision Making/ A&P   {   Click here for ABCD2, HEART and other calculatorsREFRESH Note before signing :1}                              Medical Decision Making Amount and/or Complexity of Data Reviewed Labs: ordered. Radiology: ordered.   This patient complains of ***; this involves an extensive number of treatment Options and is a complaint that carries with it a high risk of complications and morbidity. The differential includes ***  I ordered, reviewed and interpreted labs, which included *** I ordered medication *** and reviewed PMP when indicated. I ordered imaging studies which included *** and I independently    visualized and interpreted imaging which showed *** Additional history obtained from *** Previous records obtained and reviewed *** I consulted *** and discussed lab and imaging findings and discussed disposition.  Cardiac monitoring reviewed, *** Social determinants considered, *** Critical Interventions: ***  After the interventions stated above, I reevaluated the patient and found *** Admission and further testing considered, ***   {Document critical care time when  appropriate:1} {Document review of labs and clinical decision tools ie heart score, Chads2Vasc2 etc:1}  {Document your independent review of radiology images, and any outside records:1} {Document your discussion with family members, caretakers, and with consultants:1} {Document social determinants of health affecting pt's care:1} {Document your decision making why or why not admission, treatments were needed:1} Final Clinical Impression(s) / ED Diagnoses Final diagnoses:  None    Rx / DC Orders ED Discharge Orders     None

## 2023-10-29 NOTE — H&P (Signed)
 History and Physical    Patient: Martha Lozano ZOX:[id number] DOB: 1961-02-17 DOA: 10/29/2023 DOS: the patient was seen and examined on 10/30/2023 PCP: Patient, [name] Per  Patient coming from: Home  Chief Complaint:  Chief Complaint  Patient presents with   Altered Mental Status   HPI: Martha Lozano is a 63 y.o. female with medical history significant of hypertension, hyperlipidemia, type 2 diabetes mellitus, peripheral vascular disease who presents emergency department from home via EMS due to altered mental status.  Patient was reported to be lethargic all day today, so EMS was activated, on arrival of EMS team, patient was noted to have an O2 sat of 70% on room air and was placed on supplemental oxygen at 15 LPM with improvement in O2 sat to 100%.  Patient was recently admitted from 4/7 to 4/9 due to critical limb ischemia of left lower extremity, with which she had an endarterectomy and femoral-popliteal bypass.  ED Course:  In the emergency department, she was tachycardic, tachypneic, BP was 101/64, O2 sat was 98% on supplemental oxygen at 4 LPM.  Temperature 99.7 F.  Workup in the ED showed leukocytosis and normocytic anemia.  BMP was normal except for sodium of 133 and blood glucose of 181.  Albumin 3.1.  Lactic acid was normal, troponin x 2 was normal, ammonia was 13, urinalysis was positive for glycosuria and large leukocytes.  Urine drug screen was positive for cocaine. CT head without contrast showed no acute intracranial abnormality CT angiography of chest with contrast showed no evidence of pulmonary embolism, but was positive for multilobar pneumonia. Chest x-ray was suggestive of right-sided pneumonia. Patient was treated with IV ceftriaxone and azithromycin, IV hydration was provided.  Review of Systems: Review of systems as noted in the HPI. All other systems reviewed and are negative.   Past Medical History:  Diagnosis Date   Breast cancer, stage  0, right    pt currently taking Tamoxifen   Diabetes mellitus without complication (HCC)    Family history of breast cancer    Family history of prostate cancer    Family history of skin cancer    Hep C w/o coma, chronic (HCC)    Hx of hepatitis C    pt received treatment for Hep C in 2018   Peripheral arterial disease (HCC)    Sciatic pain    Past Surgical History:  Procedure Laterality Date   ABDOMINAL AORTOGRAM N/A 10/07/2023   Procedure: ABDOMINAL AORTOGRAM;  Surgeon: Adine Hoof, MD;  Location: [facility] INVASIVE CV LAB;  Service: Cardiovascular;  Laterality: N/A;   ABDOMINAL AORTOGRAM W/LOWER EXTREMITY N/A 03/04/2023   Procedure: ABDOMINAL AORTOGRAM W/LOWER EXTREMITY;  Surgeon: Adine Hoof, MD;  Location: [facility] INVASIVE CV LAB;  Service: Cardiovascular;  Laterality: N/A;   BREAST BIOPSY Right 09/12/2017   ENDARTERECTOMY FEMORAL Left 03/05/2023   Procedure: ILIOFEMORAL ENDARTERECTOMY;  Surgeon: Kayla Part, MD;  Location: [facility] OR;  Service: Vascular;  Laterality: Left;   ENDARTERECTOMY FEMORAL Left 10/21/2023   Procedure: LEFT ILIO - FEMORAL ENDARTARECTOMY;  Surgeon: Kayla Part, MD;  Location: [facility] OR;  Service: Vascular;  Laterality: Left;   FEMORAL-POPLITEAL BYPASS GRAFT Left 03/05/2023   Procedure: LEFT FEMORAL-ABOVE KNEE POPLITEAL ARTERY BYPASS GRAFT USING PROPATEN RING GRAFT X 80CM;  Surgeon: Kayla Part, MD;  Location: [facility] OR;  Service: Vascular;  Laterality: Left;   FEMORAL-POPLITEAL BYPASS GRAFT Left 10/21/2023   Procedure: REDO LEFT FEMORAL-BELOW KNEE IPSITATERAL SAPHENOUS BYPASS WITH GREATER SAPHENOUS VEIN  HARVEST;  Surgeon: Kayla Part, MD;  Location: [facility] OR;  Service: Vascular;  Laterality: Left;   INCISION AND DRAINAGE ABSCESS Left    hand   LOWER EXTREMITY ANGIOGRAPHY N/A 10/07/2023   Procedure: Lower Extremity Angiography;  Surgeon: Adine Hoof, MD;  Location: [facility] INVASIVE CV LAB;  Service: Cardiovascular;  Laterality: N/A;     Social History:  reports that she has been smoking cigarettes. She has a 11.6 pack-year smoking history. She has never used smokeless tobacco. She reports current alcohol use. She reports that she does not currently use drugs.   No Known Allergies  Family History  Problem Relation Age of Onset   Diabetes Sister    Hypertension Sister    Breast cancer Sister 3   Diabetes Brother    Breast cancer Maternal Grandmother 59   Cervical cancer Mother    Prostate cancer Father 35       metastatic to bones   Cancer Maternal Aunt        type unk dx <50   Skin cancer Paternal Uncle    Pulmonary embolism Maternal Grandfather 13   Cancer Paternal Uncle    Breast cancer Other    Stomach cancer Other      Prior to Admission medications   Medication Sig Start Date End Date Taking? Authorizing Provider  aspirin EC 81 MG tablet Take 1 tablet (81 mg total) by mouth daily. Swallow whole. 03/08/23  Yes Audria Leather, MD  Buprenorphine HCl-Naloxone HCl (SUBOXONE) 12-3 MG FILM Place 1 Film under the tongue 3 (three) times daily.   Yes [provider]  gabapentin (NEURONTIN) 800 MG tablet Take 800 mg by mouth 3 (three) times daily. 11/22/20  Yes [provider]  HYDROcodone-acetaminophen (NORCO/VICODIN) 5-325 MG tablet Take 1 tablet by mouth every 6 (six) hours as needed for moderate pain (pain score 4-6). 10/23/23  Yes Cordie Deters, PA-C  insulin glargine (LANTUS) 100 UNIT/ML injection Inject 7 Units into the skin at bedtime.   Yes [provider]  naloxone (NARCAN) nasal spray 4 mg/0.1 mL Place 1 spray into the nose once. 11/08/20  Yes [provider]  atorvastatin (LIPITOR) 40 MG tablet Take 1 tablet (40 mg total) by mouth daily. Patient not taking: Reported on 10/29/2023 03/08/23   Audria Leather, MD  clopidogrel (PLAVIX) 75 MG tablet Take 1 tablet (75 mg total) by mouth daily at 6 (six) AM. Patient not taking: Reported on 10/29/2023 03/08/23   Audria Leather, MD   levothyroxine (SYNTHROID) 100 MCG tablet Take 1 tablet (100 mcg total) by mouth daily before breakfast. Patient not taking: Reported on 10/29/2023 11/15/20   Gudena, Vinay, MD  losartan (COZAAR) 50 MG tablet Take 0.5 tablets (25 mg total) by mouth daily. Patient not taking: Reported on 10/29/2023 03/07/23   Audria Leather, MD  venlafaxine XR (EFFEXOR-XR) 37.5 MG 24 hr capsule Take 37.5 mg by mouth daily with breakfast. Patient not taking: Reported on 10/29/2023 11/11/18   [provider]  Vitamin D, Ergocalciferol, (DRISDOL) 1.25 MG (50000 UNIT) CAPS capsule Take 50,000 Units by mouth once a week. Patient not taking: Reported on 10/29/2023 09/05/23   [provider]    Physical Exam: BP (!) 129/46   Pulse 90   Temp 99.1 F (37.3 C) (Axillary)   Resp 15   Ht 5' (1.524 m)   Wt 46 kg   LMP 02/21/2016   SpO2 100%   BMI 19.81 kg/m   General: 63 y.o. year-old female well  developed well nourished in no acute distress.  Alert and oriented x3. HEENT: NCAT, EOMI Neck: Supple, trachea medial Cardiovascular: Regular rate and rhythm with no rubs or gallops.  No thyromegaly or JVD noted.  2/4 pulses in all 4 extremities. Respiratory: Clear to auscultation with no wheezes or rales. Good inspiratory effort. Abdomen: Soft, nontender nondistended with normal bowel sounds x4 quadrants. Muskuloskeletal: Left leg swelling with noted long healing incision.  No cyanosis, clubbing noted bilaterally Neuro: CN II-XII intact, strength 5/5 x 4, sensation, reflexes intact Skin: No ulcerative lesions noted or rashes Psychiatry: Judgement and insight appear normal. Mood is appropriate for condition and setting          Labs on Admission:  Basic Metabolic Panel: Recent Labs  Lab 10/29/23 1527 10/30/23 0233  NA 133* 138  K 3.8 3.5  CL 101 111  CO2 23 25  GLUCOSE 181* 94  BUN 14 10  CREATININE 0.60 0.52  CALCIUM 8.3* 8.1*  MG  --  1.8  PHOS  --  3.0   Liver Function Tests: Recent Labs   Lab 10/29/23 1527 10/30/23 0233  AST 21 19  ALT 12 12  ALKPHOS 64 55  BILITOT 0.4 0.5  PROT 6.2* 5.3*  ALBUMIN 3.1* 2.5*   No results for input(s): "LIPASE", "AMYLASE" in the last 168 hours. Recent Labs  Lab 10/29/23 1740  AMMONIA 13   CBC: Recent Labs  Lab 10/29/23 1527 10/30/23 0233  WBC 13.5* 11.6*  NEUTROABS 12.0*  --   HGB 8.9* 7.4*  HCT 28.9* 23.7*  MCV 91.7 91.9  PLT 214 212   Cardiac Enzymes: No results for input(s): "CKTOTAL", "CKMB", "CKMBINDEX", "TROPONINI" in the last 168 hours.  BNP (last 3 results) No results for input(s): "BNP" in the last 8760 hours.  ProBNP (last 3 results) No results for input(s): "PROBNP" in the last 8760 hours.  CBG: Recent Labs  Lab 10/29/23 2115  GLUCAP 126*    Radiological Exams on Admission: CT Head Wo Contrast Result Date: 10/29/2023 CLINICAL DATA:  Mental status change, unknown cause. EXAM: CT HEAD WITHOUT CONTRAST TECHNIQUE: Contiguous axial images were obtained from the base of the skull through the vertex without intravenous contrast. RADIATION DOSE REDUCTION: This exam was performed according to the departmental dose-optimization program which includes automated exposure control, adjustment of the mA and/or kV according to patient size and/or use of iterative reconstruction technique. COMPARISON:  None Available. FINDINGS: Brain: No evidence of acute infarction, hemorrhage, hydrocephalus, extra-axial collection or mass lesion/mass effect. There is bilateral periventricular hypodensity, which is non-specific but most likely seen in the settings of microvascular ischemic changes. Mild in extent. Otherwise normal appearance of brain parenchyma. Ventricles are normal. Cerebral volume is age appropriate. Vascular: No hyperdense vessel or unexpected calcification. Intracranial arteriosclerosis. Skull: Normal. Negative for fracture or focal lesion. Sinuses/Orbits: No acute finding. Mild mucoperiosteal thickening noted in the  bilateral ethmoidal air cells and bilateral maxillary sinus. Other: Visualized mastoid air cells are unremarkable. No mastoid effusion. IMPRESSION: *No acute intracranial abnormality. Electronically Signed   By: Beula Brunswick M.D.   On: 10/29/2023 18:47   CT Angio Chest PE W/Cm &/Or Wo Cm Result Date: 10/29/2023 CLINICAL DATA:  Pulmonary embolism (PE) suspected, high prob. EXAM: CT ANGIOGRAPHY CHEST WITH CONTRAST TECHNIQUE: Multidetector CT imaging of the chest was performed using the standard protocol during bolus administration of intravenous contrast. Multiplanar CT image reconstructions and MIPs were obtained to evaluate the vascular anatomy. RADIATION DOSE REDUCTION: This exam was performed according  to the departmental dose-optimization program which includes automated exposure control, adjustment of the mA and/or kV according to patient size and/or use of iterative reconstruction technique. CONTRAST:  75mL OMNIPAQUE IOHEXOL 350 MG/ML SOLN COMPARISON:  CT scan chest from 01/30/2021. FINDINGS: Cardiovascular: No evidence of embolism to the proximal subsegmental pulmonary artery level. Normal cardiac size. No pericardial effusion. No aortic aneurysm. Mediastinum/Nodes: Asymmetrically enlarged and heterogeneous left thyroid lobe noted, which is not well evaluated on the current exam. No significant interval change since the prior study. Further evaluation with nonemergent thyroid ultrasound is recommended. No solid / cystic mediastinal masses. The esophagus is nondistended precluding optimal assessment. There are few mildly prominent mediastinal and hilar lymph nodes, which do not meet the size criteria for lymphadenopathy and appear grossly similar to the prior study, favoring benign etiology. No axillary lymphadenopathy by size criteria. Lungs/Pleura: The central tracheo-bronchial tree is patent. There are extensive heterogeneous opacities throughout bilateral lungs with asymmetrically extensive involvement  of right lung lower lobe, compatible with multilobar pneumonia. There also patchy areas of smooth interlobular and intra lobular septal thickening. No lung collapse, pleural effusion or pneumothorax. Upper Abdomen: There is liver surface irregularity/nodularity, compatible with cirrhosis. There are multiple, ill-defined hyperattenuating foci throughout the liver, incompletely characterized on the current exam. Further evaluation with multiphasic contrast-enhanced MRI abdomen as per hepatic mass protocol is recommended. Remaining upper abdominal viscera within normal limits. Musculoskeletal: The visualized soft tissues of the chest wall are grossly unremarkable. No suspicious osseous lesions. There are mild multilevel degenerative changes in the visualized spine. Review of the MIP images confirms the above findings. IMPRESSION: 1. No evidence of embolism to the proximal subsegmental pulmonary artery level. 2. There are extensive heterogeneous opacities throughout bilateral lungs with asymmetrically extensive involvement of right lung lower lobe, compatible with multilobar pneumonia. Follow-up to clearing is recommended. 3. Cirrhotic liver configuration. There are multiple, ill-defined, hyperattenuating foci throughout the liver, incompletely characterized on the current exam. Further evaluation with nonemergent multiphasic contrast-enhanced MRI abdomen as per hepatic mass protocol is recommended. 4. Enlarged and heterogeneous left thyroid lobe, meeting the size criteria for nonemergent follow-up ultrasound evaluation. 5. Multiple other nonacute observations, as described above. Electronically Signed   By: Jules Schick M.D.   On: 10/29/2023 18:44   DG Chest Port 1 View Result Date: 10/29/2023 CLINICAL DATA:  Altered mental status. History of breast cancer and diabetes. Postoperative day 8 for left iliofemoral endarterectomy and redo left popliteal artery bypass EXAM: PORTABLE CHEST 1 VIEW COMPARISON:  02/25/2023  FINDINGS: The patient is rotated to the left on today's radiograph, reducing diagnostic sensitivity and specificity. Hazy right hemithoracic opacity is partially due to the leftward rotation causing asymmetric distribution of breast tissues, but I suspect superimposed indistinct nodular perihilar airspace opacity along with indistinct pulmonary vasculature. Possibilities may include pneumonia or unusual manifestation of pulmonary embolus. Metastatic disease to the right chest is not entirely excluded although is felt to be less likely. Atherosclerotic calcification of the aortic arch. No blunting of the costophrenic angles. IMPRESSION: 1. Hazy right hemithoracic opacity is partially due to the leftward rotation causing asymmetric distribution of breast tissues, but I suspect superimposed indistinct nodular perihilar airspace opacity along with indistinct pulmonary vasculature. Possibilities may include right-sided pneumonia or unusual manifestation of pulmonary embolus. Metastatic disease to the right chest is not entirely excluded although is felt to be less likely. CTA chest with contrast may be helpful for further characterization. 2. Aortic Atherosclerosis (ICD10-I70.0). Electronically Signed   By: Gaylyn Rong  M.D.   On: 10/29/2023 18:10    EKG: I independently viewed the EKG done and my findings are as followed: Sinus tachycardia with a rate of 105 bpm with APCs  Assessment/Plan Present on Admission:  Sepsis due to pneumonia [facility])  Principal Problem:   Sepsis due to pneumonia [facility]) Active Problems:   Drug abuse (HCC)   Acute respiratory failure with hypoxia (HCC)   Uncontrolled type 2 diabetes mellitus with hyperglycemia, with long-term current use of insulin (HCC)   Hypoalbuminemia due to protein-calorie malnutrition (HCC)   Acquired hypothyroidism   Essential hypertension   Mixed hyperlipidemia   Left leg swelling  Sepsis due to multifocal pneumonia Patient met sepsis criteria due  to tachycardia, tachypnea, leukocytosis and with source of infection being pneumonia. Patient was started on ceftriaxone and azithromycin, we shall continue ceftriaxone and Unasyn at this time with plan to de-escalate/discontinue based on blood culture, sputum culture, urine Legionella, strep pneumo and procalcitonin Continue Tylenol as needed Continue Mucinex, incentive spirometry, flutter valve   Acute respiratory failure with hypoxia Continue supplemental oxygen to maintain O2 sat > 92% with plan to wean patient off these as tolerated.  Patient does not use supplemental O2 at baseline.  Left leg swelling This may be due to recent endarterectomy and femoral-popliteal bypass. Lower extremity ultrasound will be done to rule out DVT  Hyperglycemia secondary to type 2 diabetes mellitus Globin A1c 7.8 on/7/25 Continue Semglee 5 units nightly and adjust dose accordingly Continue ISS and hypoglycemia protocol  Drug abuse Urine drug screen was positive for cocaine Patient was consulted on cocaine use cessation  Hypoalbuminemia possibly due to mild protein calorie malnutrition Albumin 3.1, protein supplement will be provided  Acquired hypothyroidism Continue Synthroid  Essential hypertension BP meds will be held at this time due to soft BP  Mixed hyperlipidemia Continue Lipitor   DVT prophylaxis: Lovenox  Code Status: Full code  Family Communication: None at bedside  Consults: None  Severity of Illness: The appropriate patient status for this patient is INPATIENT. Inpatient status is judged to be reasonable and necessary in order to provide the required intensity of service to ensure the patient's safety. The patient's presenting symptoms, physical exam findings, and initial radiographic and laboratory data in the context of their chronic comorbidities is felt to place them at high risk for further clinical deterioration. Furthermore, it is not anticipated that the patient will be  medically stable for discharge from the hospital within 2 midnights of admission.   * I certify that at the point of admission it is my clinical judgment that the patient will require inpatient hospital care spanning beyond 2 midnights from the point of admission due to high intensity of service, high risk for further deterioration and high frequency of surveillance required.*  Author: Ashleigh Arya, DO 10/30/2023 6:46 AM  For on call review [email or web address].
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
## 2023-10-29 NOTE — ED Notes (Addendum)
 patient has multiple pinpoint scabs over radial artery, bruising and hardened spots on left forearm along radial artery. MD notified.

## 2023-10-30 ENCOUNTER — Inpatient Hospital Stay (HOSPITAL_COMMUNITY)

## 2023-10-30 DIAGNOSIS — M7989 Other specified soft tissue disorders: Secondary | ICD-10-CM | POA: Insufficient documentation

## 2023-10-30 DIAGNOSIS — A419 Sepsis, unspecified organism: Secondary | ICD-10-CM | POA: Diagnosis not present

## 2023-10-30 DIAGNOSIS — J189 Pneumonia, unspecified organism: Secondary | ICD-10-CM | POA: Diagnosis not present

## 2023-10-30 LAB — CBC
HCT: 23.7 % — ABNORMAL LOW (ref 36.0–46.0)
Hemoglobin: 7.4 g/dL — ABNORMAL LOW (ref 12.0–15.0)
MCH: 28.7 pg (ref 26.0–34.0)
MCHC: 31.2 g/dL (ref 30.0–36.0)
MCV: 91.9 fL (ref 80.0–100.0)
Platelets: 212 10*3/uL (ref 150–400)
RBC: 2.58 MIL/uL — ABNORMAL LOW (ref 3.87–5.11)
RDW: 14 % (ref 11.5–15.5)
WBC: 11.6 10*3/uL — ABNORMAL HIGH (ref 4.0–10.5)
nRBC: 0 % (ref 0.0–0.2)

## 2023-10-30 LAB — COMPREHENSIVE METABOLIC PANEL WITH GFR
ALT: 12 U/L (ref 0–44)
AST: 19 U/L (ref 15–41)
Albumin: 2.5 g/dL — ABNORMAL LOW (ref 3.5–5.0)
Alkaline Phosphatase: 55 U/L (ref 38–126)
Anion gap: 2 — ABNORMAL LOW (ref 5–15)
BUN: 10 mg/dL (ref 8–23)
CO2: 25 mmol/L (ref 22–32)
Calcium: 8.1 mg/dL — ABNORMAL LOW (ref 8.9–10.3)
Chloride: 111 mmol/L (ref 98–111)
Creatinine, Ser: 0.52 mg/dL (ref 0.44–1.00)
GFR, Estimated: >60 mL/min (ref >60)
Glucose, Bld: 94 mg/dL (ref 70–99)
Potassium: 3.5 mmol/L (ref 3.5–5.1)
Sodium: 138 mmol/L (ref 135–145)
Total Bilirubin: 0.5 mg/dL (ref 0.0–1.2)
Total Protein: 5.3 g/dL — ABNORMAL LOW (ref 6.5–8.1)

## 2023-10-30 LAB — IRON AND TIBC
Iron: 16 ug/dL — ABNORMAL LOW (ref 28–170)
Saturation Ratios: 6 % — ABNORMAL LOW (ref 10.4–31.8)
TIBC: 270 ug/dL (ref 250–450)
UIBC: 254 ug/dL

## 2023-10-30 LAB — PHOSPHORUS: Phosphorus: 3 mg/dL (ref 2.5–4.6)

## 2023-10-30 LAB — GLUCOSE, CAPILLARY
Glucose-Capillary: 74 mg/dL (ref 70–99)
Glucose-Capillary: 139 mg/dL — ABNORMAL HIGH (ref 70–99)
Glucose-Capillary: 142 mg/dL — ABNORMAL HIGH (ref 70–99)
Glucose-Capillary: 153 mg/dL — ABNORMAL HIGH (ref 70–99)

## 2023-10-30 LAB — STREP PNEUMONIAE URINARY ANTIGEN: Strep Pneumo Urinary Antigen: NEGATIVE

## 2023-10-30 LAB — MAGNESIUM: Magnesium: 1.8 mg/dL (ref 1.7–2.4)

## 2023-10-30 LAB — MRSA NEXT GEN BY PCR, NASAL: MRSA by PCR Next Gen: NOT DETECTED

## 2023-10-30 MED ORDER — MAGNESIUM SULFATE 2 GM/50ML IV SOLN
2.0000 g | Freq: Once | INTRAVENOUS | Status: AC
  Administered 2023-10-30: 2 g via INTRAVENOUS
  Filled 2023-10-30: qty 50

## 2023-10-30 MED ORDER — LACTATED RINGERS IV SOLN: INTRAVENOUS | Status: AC

## 2023-10-30 NOTE — Hospital Course (Addendum)
 Martha Lozano is a 63 y.o. female with medical history significant of hypertension, hyperlipidemia, type 2 diabetes mellitus, peripheral vascular disease who presents emergency department from home via EMS due to altered mental status.  Patient was reported to be lethargic all day today, so EMS was activated, on arrival of EMS team, patient was noted to have an O2 sat of 70% on room air and was placed on supplemental oxygen at 15 LPM with improvement in O2 sat to 100%.   Patient was recently admitted from 4/7 to 4/9 due to critical limb ischemia of left lower extremity, with which she had an endarterectomy and femoral-popliteal bypass.   ED Course: she was tachycardic, tachypneic, BP was 101/64, O2 sat was 98% on 4 LPM.  Temperature 99.7 F.  Leukocytosis and normocytic anemia.  BMP was normal except for sodium of 133 and blood glucose of 181.  Albumin 3.1.  Lactic acid was normal, troponin x 2 was normal, ammonia was 13, urinalysis was positive for glycosuria and large leukocytes.  Urine drug screen was positive for cocaine. CT head without contrast showed no acute intracranial abnormality CT angiography of chest with contrast showed no evidence of pulmonary embolism, but was positive for multilobar pneumonia. Chest x-ray was suggestive of right-sided pneumonia. Patient was treated with IV ceftriaxone and azithromycin, IV hydration was provided.    Assessment & Plan:   Principal Problem:   Sepsis due to pneumonia [facility] Active Problems:   Drug abuse (HCC)   Acute respiratory failure with hypoxia (HCC)   Uncontrolled type 2 diabetes mellitus with hyperglycemia, with long-term current use of insulin (HCC)   Hypoalbuminemia due to protein-calorie malnutrition (HCC)   Acquired hypothyroidism   Essential hypertension   Mixed hyperlipidemia   Left leg swelling  Assessment/Plan     Sepsis due to multifocal pneumonia Stable  Blood pressure (!) 129/46, pulse 90, temp, 99.1 F (37.3  C), RR 15,  SpO2 100% on 4 L   - Improving sepsis physiology-with exception still requiring 3 L of oxygen, satting 100% -not O2 dependent at baseline  POA: met sepsis criteria due to tachycardia, tachypnea, leukocytosis and with source of infection being pneumonia. - Cont. IV Abx of  continue ceftriaxone and Unasyn - will Follow up with Blood culture, sputum culture, urine Legionella, strep pneumo  - Lactic acid 1.4 Continue Tylenol as needed Continue Mucinex, incentive spirometry, flutter valve    Acute respiratory failure with hypoxia Due to acute pneumonia, continue respiratory support, 4 L satting 100% De-escalating-weaning off oxygen Continue nebs, considering adding IV steroids   Possible UTI: Asymptomatic UA positive for some leukocyte esterase, rare bacteria, WBC of 21-50 -Ordered IV antibiotics of Rocephin -Will follow-up with cultures   Left leg swelling This may be due to recent endarterectomy and femoral-popliteal bypass. Lower extremity ultrasound will be done to rule out DVT   Hyperglycemia secondary to type 2 diabetes mellitus Globin A1c 7.8 on/7/25 Continue Semglee 5 units nightly and adjust dose accordingly Continue ISS and hypoglycemia protocol   Drug abuse Urine drug screen was positive for cocaine Patient was consulted on cocaine use cessation -   Hypoalbuminemia possibly due to mild protein calorie malnutrition Albumin 3.1, protein supplement will be provided   Acquired hypothyroidism Continue Synthroid   Essential hypertension BP meds will be held at this time due to soft BP   Mixed hyperlipidemia Continue Lipitor

## 2023-10-30 NOTE — Plan of Care (Signed)
   Problem: Education: Goal: Knowledge of General Education information will improve Description: Including pain rating scale, medication(s)/side effects and non-pharmacologic comfort measures Outcome: Progressing   Problem: Health Behavior/Discharge Planning: Goal: Ability to manage health-related needs will improve Outcome: Progressing   Problem: Clinical Measurements: Goal: Ability to maintain clinical measurements within normal limits will improve Outcome: Progressing Goal: Will remain free from infection Outcome: Progressing Goal: Diagnostic test results will improve Outcome: Progressing Goal: Respiratory complications will improve Outcome: Progressing Goal: Cardiovascular complication will be avoided Outcome: Progressing   Problem: Activity: Goal: Risk for activity intolerance will decrease Outcome: Progressing   Problem: Nutrition: Goal: Adequate nutrition will be maintained Outcome: Progressing   Problem: Coping: Goal: Level of anxiety will decrease Outcome: Progressing   Problem: Elimination: Goal: Will not experience complications related to bowel motility Outcome: Progressing Goal: Will not experience complications related to urinary retention Outcome: Progressing   Problem: Pain Managment: Goal: General experience of comfort will improve and/or be controlled Outcome: Progressing   Problem: Safety: Goal: Ability to remain free from injury will improve Outcome: Progressing   Problem: Skin Integrity: Goal: Risk for impaired skin integrity will decrease Outcome: Progressing   Problem: Education: Goal: Ability to describe self-care measures that may prevent or decrease complications (Diabetes Survival Skills Education) will improve Outcome: Progressing Goal: Individualized Educational Video(s) Outcome: Progressing   Problem: Coping: Goal: Ability to adjust to condition or change in health will improve Outcome: Progressing   Problem: Fluid  Volume: Goal: Ability to maintain a balanced intake and output will improve Outcome: Progressing   Problem: Health Behavior/Discharge Planning: Goal: Ability to identify and utilize available resources and services will improve Outcome: Progressing Goal: Ability to manage health-related needs will improve Outcome: Progressing   Problem: Metabolic: Goal: Ability to maintain appropriate glucose levels will improve Outcome: Progressing   Problem: Nutritional: Goal: Maintenance of adequate nutrition will improve Outcome: Progressing Goal: Progress toward achieving an optimal weight will improve Outcome: Progressing   Problem: Skin Integrity: Goal: Risk for impaired skin integrity will decrease Outcome: Progressing   Problem: Tissue Perfusion: Goal: Adequacy of tissue perfusion will improve Outcome: Progressing   Problem: Activity: Goal: Ability to tolerate increased activity will improve Outcome: Progressing   Problem: Clinical Measurements: Goal: Ability to maintain a body temperature in the normal range will improve Outcome: Progressing   Problem: Respiratory: Goal: Ability to maintain adequate ventilation will improve Outcome: Progressing Goal: Ability to maintain a clear airway will improve Outcome: Progressing

## 2023-10-30 NOTE — Progress Notes (Signed)
 PROGRESS NOTE    Patient: Martha Lozano                            PCP: Patient, [name] Per                    DOB: 1960/07/30            DOA: 10/29/2023 ZOX:[id number]             DOS: 10/30/2023, 11:07 AM   LOS: 1 day   Date of Service: The patient was seen and examined on 10/30/2023  Subjective:   The patient was seen and examined this morning. Hemodynamically stable, still requiring 3 L of oxygen, satting 100% Lethargic but easily arousable, following commands  Brief Narrative:    Tenesia Escudero is a 63 y.o. female with medical history significant of hypertension, hyperlipidemia, type 2 diabetes mellitus, peripheral vascular disease who presents emergency department from home via EMS due to altered mental status.  Patient was reported to be lethargic all day today, so EMS was activated, on arrival of EMS team, patient was noted to have an O2 sat of 70% on room air and was placed on supplemental oxygen at 15 LPM with improvement in O2 sat to 100%.   Patient was recently admitted from 4/7 to 4/9 due to critical limb ischemia of left lower extremity, with which she had an endarterectomy and femoral-popliteal bypass.   ED Course: she was tachycardic, tachypneic, BP was 101/64, O2 sat was 98% on 4 LPM.  Temperature 99.7 F.  Leukocytosis and normocytic anemia.  BMP was normal except for sodium of 133 and blood glucose of 181.  Albumin 3.1.  Lactic acid was normal, troponin x 2 was normal, ammonia was 13, urinalysis was positive for glycosuria and large leukocytes.  Urine drug screen was positive for cocaine. CT head without contrast showed no acute intracranial abnormality CT angiography of chest with contrast showed no evidence of pulmonary embolism, but was positive for multilobar pneumonia. Chest x-ray was suggestive of right-sided pneumonia. Patient was treated with IV ceftriaxone and azithromycin, IV hydration was provided.   Assessment/Plan   Sepsis due to  pneumonia (HCC)   Sepsis due to multifocal pneumonia Stable  Blood pressure (!) 129/46, pulse 90, temp, 99.1 F (37.3 C), RR 15,  SpO2 100% on 4 L     POA: met sepsis criteria due to tachycardia, tachypnea, leukocytosis and with source of infection being pneumonia. - Cont. IV Abx of  continue ceftriaxone and Unasyn - will Follow up with Blood culture, sputum culture, urine Legionella, strep pneumo  - Lactic acid 1.4 Continue Tylenol as needed Continue Mucinex, incentive spirometry, flutter valve    Acute respiratory failure with hypoxia Due to acute pneumonia, continue respiratory support, 4 L satting 100% De-escalating-weaning off oxygen Continue nebs, considering adding IV steroids   Possible UTI: Asymptomatic UA positive for some leukocyte esterase, rare bacteria, WBC of 21-50 -Ordered IV antibiotics of Rocephin -Will follow-up with cultures   Left leg swelling This may be due to recent endarterectomy and femoral-popliteal bypass. Lower extremity ultrasound will be done to rule out DVT   Hyperglycemia secondary to type 2 diabetes mellitus Globin A1c 7.8 on/7/25 Continue Semglee 5 units nightly and adjust dose accordingly Continue ISS and hypoglycemia protocol   Drug abuse Urine drug screen was positive for cocaine Patient was consulted on cocaine use cessation   Hypoalbuminemia possibly due to  mild protein calorie malnutrition Albumin 3.1, protein supplement will be provided   Acquired hypothyroidism Continue Synthroid   Essential hypertension BP meds will be held at this time due to soft BP   Mixed hyperlipidemia Continue Lipitor   ------------------------------------------------------------------------------------------------------------------------------------ Nutritional status:  The patient's BMI is: Body mass index is 19.81 kg/m. I agree with the assessment and plan as outlined  ---------------------------------------------------------------------------------------------------------------------------------------- Cultures; Blood Cultures x 2 >>  Sputum Culture >>   ------------------------------------------------------------------------------------------------------------------------------------------------  DVT prophylaxis:  SCDs Start: 10/29/23 2030   Code Status:   Code Status: Full Code  Family Communication: Fianc at bedside updated . -Advance care planning has been discussed.   Admission status:   Status is: Inpatient Remains inpatient appropriate because: Requiring respiratory support, IV fluids, IV antibiotics   Disposition: From  - home             Planning for discharge in 2 days: to Home w [facility]    Procedures:   No admission procedures for hospital encounter.   Antimicrobials:  Anti-infectives (From admission, onward)    Start     Dose/Rate Route Frequency Ordered Stop   10/30/23 1000  cefTRIAXone (ROCEPHIN) 2 g in sodium chloride 0.9 % 100 mL IVPB  Status:  Discontinued        2 g 200 mL/hr over 30 Minutes Intravenous Every 24 hours 10/29/23 2031 10/29/23 2035   10/30/23 1000  Ampicillin-Sulbactam (UNASYN) 3 g in sodium chloride 0.9 % 100 mL IVPB        3 g 200 mL/hr over 30 Minutes Intravenous Every 6 hours 10/29/23 2035     10/30/23 1000  azithromycin (ZITHROMAX) 500 mg in sodium chloride 0.9 % 250 mL IVPB        500 mg 250 mL/hr over 60 Minutes Intravenous Every 24 hours 10/29/23 2035     10/29/23 1615  cefTRIAXone (ROCEPHIN) 1 g in sodium chloride 0.9 % 100 mL IVPB        1 g 200 mL/hr over 30 Minutes Intravenous  Once 10/29/23 1603 10/29/23 1710   10/29/23 1615  azithromycin (ZITHROMAX) 500 mg in sodium chloride 0.9 % 250 mL IVPB        500 mg 250 mL/hr over 60 Minutes Intravenous  Once 10/29/23 1603 10/29/23 1834        Medication:   Chlorhexidine Gluconate Cloth  6 each Topical Daily   dextromethorphan-guaiFENesin  1  tablet Oral BID   feeding supplement (GLUCERNA SHAKE)  237 mL Oral TID BM   insulin aspart  0-5 Units Subcutaneous QHS   insulin aspart  0-9 Units Subcutaneous TID WC   insulin glargine-yfgn  5 Units Subcutaneous QHS    acetaminophen **OR** acetaminophen, ondansetron **OR** ondansetron (ZOFRAN) IV   Objective:   Vitals:   10/30/23 0700 10/30/23 0800 10/30/23 0900 10/30/23 1000  BP: (!) 129/57 (!) 148/65 (!) 124/50 (!) 115/56  Pulse: 88 91 91 83  Resp: 20 11 16 15   Temp: 98.5 F (36.9 C)     TempSrc: Oral     SpO2: 100% 100% 100% 100%  Weight:      Height:        Intake/Output Summary (Last 24 hours) at 10/30/2023 1107 Last data filed at 10/30/2023 1024 Gross per 24 hour  Intake 3196.8 ml  Output 1100 ml  Net 2096.8 ml   Filed Weights   10/29/23 1503 10/29/23 2100  Weight: 42.6 kg 46 kg     Physical examination:  General:  AAO x 3,  cooperative, no distress; lethargic  HEENT:  Normocephalic, PERRL, otherwise with in Normal limits   Neuro:  CNII-XII intact. , normal motor and sensation, reflexes intact   Lungs:   Clear to auscultation BL, Respirations unlabored,  No wheezes / crackles  Cardio:    S1/S2, RRR, No murmure, No Rubs or Gallops   Abdomen:  Soft, non-tender, bowel sounds active all four quadrants, no guarding or peritoneal signs.  Muscular  skeletal:  Limited exam -global generalized weaknesses - in bed, able to move all 4 extremities,   2+ pulses,  symmetric, No pitting edema  Skin:  Dry, warm to touch, negative for any Rashes,  Wounds: Please see nursing documentation     ------------------------------------------------------------------------------------------------------------------------------------------    LABs:     Latest Ref Rng & Units 10/30/2023    2:33 AM 10/29/2023    3:27 PM 10/22/2023    5:00 AM  CBC  WBC 4.0 - 10.5 K/uL 11.6  13.5  9.0   Hemoglobin 12.0 - 15.0 g/dL 7.4  8.9  8.9   Hematocrit 36.0 - 46.0 % 23.7  28.9  27.3    Platelets 150 - 400 K/uL 212  214  195       Latest Ref Rng & Units 10/30/2023    2:33 AM 10/29/2023    3:27 PM 10/22/2023    5:00 AM  CMP  Glucose 70 - 99 mg/dL 94  409  811   BUN 8 - 23 mg/dL 10  14  13    Creatinine 0.44 - 1.00 mg/dL 9.14  7.82  9.56   Sodium 135 - 145 mmol/L 138  133  138   Potassium 3.5 - 5.1 mmol/L 3.5  3.8  4.0   Chloride 98 - 111 mmol/L 111  101  103   CO2 22 - 32 mmol/L 25  23  25    Calcium 8.9 - 10.3 mg/dL 8.1  8.3  8.5   Total Protein 6.5 - 8.1 g/dL 5.3  6.2    Total Bilirubin 0.0 - 1.2 mg/dL 0.5  0.4    Alkaline Phos 38 - 126 U/L 55  64    AST 15 - 41 U/L 19  21    ALT 0 - 44 U/L 12  12         Micro Results Recent Results (from the past 240 hours)  Surgical pcr screen     Status: Abnormal   Collection Time: 10/21/23  6:00 AM   Specimen: Nasal Mucosa; Nasal Swab  Result Value Ref Range Status   MRSA, PCR NEGATIVE NEGATIVE Final   Staphylococcus aureus POSITIVE (A) NEGATIVE Final    Comment: (NOTE) The Xpert SA Assay (FDA approved for NASAL specimens in patients 23 years of age and older), is one component of a comprehensive surveillance program. It is not intended to diagnose infection nor to guide or monitor treatment. Performed at [facility], [address], [place], [place] [address]   Culture, blood (routine x 2)     Status: None (Preliminary result)   Collection Time: 10/29/23  3:24 PM   Specimen: BLOOD  Result Value Ref Range Status   Specimen Description BLOOD RIGHT HAND  Final   Special Requests   Final    BOTTLES DRAWN AEROBIC ONLY Blood Culture results may not be optimal due to an inadequate volume of blood received in culture bottles   Culture   Final    NO GROWTH < 24 HOURS  Performed at [facility], [address] [place], [place] [address]    Report Status PENDING  Incomplete  Culture, blood (routine x 2)     Status: None (Preliminary result)   Collection Time: 10/29/23  3:27 PM   Specimen: BLOOD  Result Value  Ref Range Status   Specimen Description BLOOD LEFT WRIST  Final   Special Requests   Final    BOTTLES DRAWN AEROBIC AND ANAEROBIC Blood Culture results may not be optimal due to an inadequate volume of blood received in culture bottles   Culture   Final    NO GROWTH < 24 HOURS Performed at [facility], [address] [place], [place] [address]    Report Status PENDING  Incomplete  Resp panel by RT-PCR (RSV, Flu A&B, Covid) Anterior Nasal Swab     Status: None   Collection Time: 10/29/23  7:34 PM   Specimen: Anterior Nasal Swab  Result Value Ref Range Status   SARS Coronavirus 2 by RT PCR NEGATIVE NEGATIVE Final    Comment: (NOTE) SARS-CoV-2 target nucleic acids are NOT DETECTED.  The SARS-CoV-2 RNA is generally detectable in upper respiratory specimens during the acute phase of infection. The lowest concentration of SARS-CoV-2 viral copies this assay can detect is 138 copies/mL. A negative result does not preclude SARS-Cov-2 infection and should not be used as the sole basis for treatment or other patient management decisions. A negative result may occur with  improper specimen collection/handling, submission of specimen other than nasopharyngeal swab, presence of viral mutation(s) within the areas targeted by this assay, and inadequate number of viral copies(<138 copies/mL). A negative result must be combined with clinical observations, patient history, and epidemiological information. The expected result is Negative.  Fact Sheet for Patients:  BloggerCourse.com  Fact Sheet for Healthcare Providers:  SeriousBroker.it  This test is no t yet approved or cleared by the [place]  FDA and  has been authorized for detection and/or diagnosis of SARS-CoV-2 by FDA under an Emergency Use Authorization (EUA). This EUA will remain  in effect (meaning this test can be used) for the duration of the COVID-19 declaration under Section  564(b)(1) of the Act, 21 U.S.C.section 360bbb-3(b)(1), unless the authorization is terminated  or revoked sooner.       Influenza A by PCR NEGATIVE NEGATIVE Final   Influenza B by PCR NEGATIVE NEGATIVE Final    Comment: (NOTE) The Xpert Xpress SARS-CoV-2/FLU/RSV plus assay is intended as an aid in the diagnosis of influenza from Nasopharyngeal swab specimens and should not be used as a sole basis for treatment. Nasal washings and aspirates are unacceptable for Xpert Xpress SARS-CoV-2/FLU/RSV testing.  Fact Sheet for Patients: BloggerCourse.com  Fact Sheet for Healthcare Providers: SeriousBroker.it  This test is not yet approved or cleared by the [place]  FDA and has been authorized for detection and/or diagnosis of SARS-CoV-2 by FDA under an Emergency Use Authorization (EUA). This EUA will remain in effect (meaning this test can be used) for the duration of the COVID-19 declaration under Section 564(b)(1) of the Act, 21 U.S.C. section 360bbb-3(b)(1), unless the authorization is terminated or revoked.     Resp Syncytial Virus by PCR NEGATIVE NEGATIVE Final    Comment: (NOTE) Fact Sheet for Patients: BloggerCourse.com  Fact Sheet for Healthcare Providers: SeriousBroker.it  This test is not yet approved or cleared by the [place]  FDA and has been authorized for detection and/or diagnosis of SARS-CoV-2 by FDA under an Emergency Use Authorization (EUA). This EUA will remain  in effect (meaning this test can be used) for the duration of the COVID-19 declaration under Section 564(b)(1) of the Act, 21 U.S.C. section 360bbb-3(b)(1), unless the authorization is terminated or revoked.  Performed at [facility], [address], [place], [place] [address]   MRSA Next Gen by PCR, Nasal     Status: None   Collection Time: 10/29/23  8:50 PM   Specimen: Nasal Mucosa; Nasal Swab   Result Value Ref Range Status   MRSA by PCR Next Gen NOT DETECTED NOT DETECTED Final    Comment: (NOTE) The GeneXpert MRSA Assay (FDA approved for NASAL specimens only), is one component of a comprehensive MRSA colonization surveillance program. It is not intended to diagnose MRSA infection nor to guide or monitor treatment for MRSA infections. Test performance is not FDA approved in patients less than 35 years old. Performed at [facility], [address], [place], [place] [address]     Radiology Reports CT Head Wo Contrast Result Date: 10/29/2023 CLINICAL DATA:  Mental status change, unknown cause. EXAM: CT HEAD WITHOUT CONTRAST TECHNIQUE: Contiguous axial images were obtained from the base of the skull through the vertex without intravenous contrast. RADIATION DOSE REDUCTION: This exam was performed according to the departmental dose-optimization program which includes automated exposure control, adjustment of the mA and/or kV according to patient size and/or use of iterative reconstruction technique. COMPARISON:  None Available. FINDINGS: Brain: No evidence of acute infarction, hemorrhage, hydrocephalus, extra-axial collection or mass lesion/mass effect. There is bilateral periventricular hypodensity, which is non-specific but most likely seen in the settings of microvascular ischemic changes. Mild in extent. Otherwise normal appearance of brain parenchyma. Ventricles are normal. Cerebral volume is age appropriate. Vascular: No hyperdense vessel or unexpected calcification. Intracranial arteriosclerosis. Skull: Normal. Negative for fracture or focal lesion. Sinuses/Orbits: No acute finding. Mild mucoperiosteal thickening noted in the bilateral ethmoidal air cells and bilateral maxillary sinus. Other: Visualized mastoid air cells are unremarkable. No mastoid effusion. IMPRESSION: *No acute intracranial abnormality. Electronically Signed   By: Jules Schick M.D.   On: 10/29/2023 18:47   CT Angio  Chest PE W/Cm &/Or Wo Cm Result Date: 10/29/2023 CLINICAL DATA:  Pulmonary embolism (PE) suspected, high prob. EXAM: CT ANGIOGRAPHY CHEST WITH CONTRAST TECHNIQUE: Multidetector CT imaging of the chest was performed using the standard protocol during bolus administration of intravenous contrast. Multiplanar CT image reconstructions and MIPs were obtained to evaluate the vascular anatomy. RADIATION DOSE REDUCTION: This exam was performed according to the departmental dose-optimization program which includes automated exposure control, adjustment of the mA and/or kV according to patient size and/or use of iterative reconstruction technique. CONTRAST:  75mL OMNIPAQUE IOHEXOL 350 MG/ML SOLN COMPARISON:  CT scan chest from 01/30/2021. FINDINGS: Cardiovascular: No evidence of embolism to the proximal subsegmental pulmonary artery level. Normal cardiac size. No pericardial effusion. No aortic aneurysm. Mediastinum/Nodes: Asymmetrically enlarged and heterogeneous left thyroid lobe noted, which is not well evaluated on the current exam. No significant interval change since the prior study. Further evaluation with nonemergent thyroid ultrasound is recommended. No solid / cystic mediastinal masses. The esophagus is nondistended precluding optimal assessment. There are few mildly prominent mediastinal and hilar lymph nodes, which do not meet the size criteria for lymphadenopathy and appear grossly similar to the prior study, favoring benign etiology. No axillary lymphadenopathy by size criteria. Lungs/Pleura: The central tracheo-bronchial tree is patent. There are extensive heterogeneous opacities throughout bilateral lungs with asymmetrically extensive involvement of right lung lower lobe, compatible with multilobar pneumonia. There also  patchy areas of smooth interlobular and intra lobular septal thickening. No lung collapse, pleural effusion or pneumothorax. Upper Abdomen: There is liver surface irregularity/nodularity,  compatible with cirrhosis. There are multiple, ill-defined hyperattenuating foci throughout the liver, incompletely characterized on the current exam. Further evaluation with multiphasic contrast-enhanced MRI abdomen as per hepatic mass protocol is recommended. Remaining upper abdominal viscera within normal limits. Musculoskeletal: The visualized soft tissues of the chest wall are grossly unremarkable. No suspicious osseous lesions. There are mild multilevel degenerative changes in the visualized spine. Review of the MIP images confirms the above findings. IMPRESSION: 1. No evidence of embolism to the proximal subsegmental pulmonary artery level. 2. There are extensive heterogeneous opacities throughout bilateral lungs with asymmetrically extensive involvement of right lung lower lobe, compatible with multilobar pneumonia. Follow-up to clearing is recommended. 3. Cirrhotic liver configuration. There are multiple, ill-defined, hyperattenuating foci throughout the liver, incompletely characterized on the current exam. Further evaluation with nonemergent multiphasic contrast-enhanced MRI abdomen as per hepatic mass protocol is recommended. 4. Enlarged and heterogeneous left thyroid lobe, meeting the size criteria for nonemergent follow-up ultrasound evaluation. 5. Multiple other nonacute observations, as described above. Electronically Signed   By: Beula Brunswick M.D.   On: 10/29/2023 18:44   DG Chest Port 1 View Result Date: 10/29/2023 CLINICAL DATA:  Altered mental status. History of breast cancer and diabetes. Postoperative day 8 for left iliofemoral endarterectomy and redo left popliteal artery bypass EXAM: PORTABLE CHEST 1 VIEW COMPARISON:  02/25/2023 FINDINGS: The patient is rotated to the left on today's radiograph, reducing diagnostic sensitivity and specificity. Hazy right hemithoracic opacity is partially due to the leftward rotation causing asymmetric distribution of breast tissues, but I suspect  superimposed indistinct nodular perihilar airspace opacity along with indistinct pulmonary vasculature. Possibilities may include pneumonia or unusual manifestation of pulmonary embolus. Metastatic disease to the right chest is not entirely excluded although is felt to be less likely. Atherosclerotic calcification of the aortic arch. No blunting of the costophrenic angles. IMPRESSION: 1. Hazy right hemithoracic opacity is partially due to the leftward rotation causing asymmetric distribution of breast tissues, but I suspect superimposed indistinct nodular perihilar airspace opacity along with indistinct pulmonary vasculature. Possibilities may include right-sided pneumonia or unusual manifestation of pulmonary embolus. Metastatic disease to the right chest is not entirely excluded although is felt to be less likely. CTA chest with contrast may be helpful for further characterization. 2. Aortic Atherosclerosis (ICD10-I70.0). Electronically Signed   By: Freida Jes M.D.   On: 10/29/2023 18:10    SIGNED: Bobbetta Burnet, MD, FHM. FAAFP. Arlin Benes - [facility] Time spent - 55 min.  In seeing, evaluating and examining the patient. Reviewing medical records, labs, drawn plan of care. [facility],  Pager (please use [email or web address] to page/ text) Please use Epic Secure Chat for non-urgent communication (7AM-7PM)  If 7PM-7AM, please contact night-coverage [email or web address], 10/30/2023, 11:07 AM

## 2023-10-30 NOTE — Discharge Instructions (Signed)

## 2023-10-30 NOTE — TOC CM/SW Note (Signed)
 Transition of Care [facility]) - Inpatient Brief Assessment   Patient Details  Name: Loney Peto MRN: [id number] Date of Birth: 04-17-1961  Transition of Care [facility]) CM/SW Contact:    Grandville Lax, LCSWA Phone Number: 10/30/2023, 10:52 AM   Clinical Narrative: CSW notes pt has [name] listed in chart, PCP list added to AVS.   Transition of Care Department [facility]) has reviewed patient and no TOC needs have been identified at this time. We will continue to monitor patient advancement through interdiciplinary progression rounds. If new patient transition needs arise, please place a TOC consult.   Transition of Care Asessment: Insurance and Status: Insurance coverage has been reviewed Patient has primary care physician: No (PCP list added to AVS) Home environment has been reviewed: From home Prior level of function:: Independent Prior/Current Home Services: No current home services Social Drivers of Health Review: SDOH reviewed no interventions necessary Readmission risk has been reviewed: Yes Transition of care needs: no transition of care needs at this time

## 2023-10-31 DIAGNOSIS — A419 Sepsis, unspecified organism: Secondary | ICD-10-CM | POA: Diagnosis not present

## 2023-10-31 DIAGNOSIS — J189 Pneumonia, unspecified organism: Secondary | ICD-10-CM | POA: Diagnosis not present

## 2023-10-31 LAB — BASIC METABOLIC PANEL WITH GFR
Anion gap: 8 (ref 5–15)
BUN: 10 mg/dL (ref 8–23)
CO2: 26 mmol/L (ref 22–32)
Calcium: 7.9 mg/dL — ABNORMAL LOW (ref 8.9–10.3)
Chloride: 103 mmol/L (ref 98–111)
Creatinine, Ser: 0.55 mg/dL (ref 0.44–1.00)
GFR, Estimated: >60 mL/min (ref >60)
Glucose, Bld: 173 mg/dL — ABNORMAL HIGH (ref 70–99)
Potassium: 3.5 mmol/L (ref 3.5–5.1)
Sodium: 137 mmol/L (ref 135–145)

## 2023-10-31 LAB — IRON AND TIBC
Iron: 20 ug/dL — ABNORMAL LOW (ref 28–170)
Saturation Ratios: 8 % — ABNORMAL LOW (ref 10.4–31.8)
TIBC: 255 ug/dL (ref 250–450)
UIBC: 235 ug/dL

## 2023-10-31 LAB — CBC
HCT: 24.8 % — ABNORMAL LOW (ref 36.0–46.0)
Hemoglobin: 7.7 g/dL — ABNORMAL LOW (ref 12.0–15.0)
MCH: 28.5 pg (ref 26.0–34.0)
MCHC: 31 g/dL (ref 30.0–36.0)
MCV: 91.9 fL (ref 80.0–100.0)
Platelets: 214 10*3/uL (ref 150–400)
RBC: 2.7 MIL/uL — ABNORMAL LOW (ref 3.87–5.11)
RDW: 13.4 % (ref 11.5–15.5)
WBC: 7.9 10*3/uL (ref 4.0–10.5)
nRBC: 0 % (ref 0.0–0.2)

## 2023-10-31 LAB — TSH: TSH: 4.827 u[IU]/mL — ABNORMAL HIGH (ref 0.350–4.500)

## 2023-10-31 LAB — URINE CULTURE

## 2023-10-31 LAB — GLUCOSE, CAPILLARY
Glucose-Capillary: 125 mg/dL — ABNORMAL HIGH (ref 70–99)
Glucose-Capillary: 236 mg/dL — ABNORMAL HIGH (ref 70–99)
Glucose-Capillary: 173 mg/dL — ABNORMAL HIGH (ref 70–99)
Glucose-Capillary: 141 mg/dL — ABNORMAL HIGH (ref 70–99)

## 2023-10-31 LAB — VITAMIN B12: Vitamin B-12: 274 pg/mL (ref 180–914)

## 2023-10-31 LAB — FOLATE: Folate: 12.5 ng/mL (ref >5.9)

## 2023-10-31 MED ORDER — BUPRENORPHINE HCL-NALOXONE HCL 8-2 MG SL SUBL
1.0000 | SUBLINGUAL_TABLET | Freq: Every day | SUBLINGUAL | Status: DC
  Administered 2023-10-31 – 2023-11-01 (×2): 1 via SUBLINGUAL
  Filled 2023-10-31 (×2): qty 1

## 2023-10-31 MED ORDER — BUPRENORPHINE HCL-NALOXONE HCL 2-0.5 MG SL SUBL
2.0000 | SUBLINGUAL_TABLET | Freq: Every day | SUBLINGUAL | Status: DC
  Administered 2023-10-31 – 2023-11-01 (×2): 2 via SUBLINGUAL
  Filled 2023-10-31 (×2): qty 2

## 2023-10-31 MED ORDER — ATORVASTATIN CALCIUM 40 MG PO TABS
40.0000 mg | ORAL_TABLET | Freq: Every day | ORAL | Status: DC
  Administered 2023-10-31 – 2023-11-01 (×2): 40 mg via ORAL
  Filled 2023-10-31 (×2): qty 1

## 2023-10-31 MED ORDER — VENLAFAXINE HCL ER 37.5 MG PO CP24
37.5000 mg | ORAL_CAPSULE | Freq: Every day | ORAL | Status: DC
  Administered 2023-11-01: 37.5 mg via ORAL
  Filled 2023-10-31 (×2): qty 1

## 2023-10-31 MED ORDER — HYDROCODONE-ACETAMINOPHEN 5-325 MG PO TABS
1.0000 | ORAL_TABLET | Freq: Four times a day (QID) | ORAL | Status: DC | PRN
  Administered 2023-10-31: 1 via ORAL
  Filled 2023-10-31 (×2): qty 1

## 2023-10-31 MED ORDER — LEVOTHYROXINE SODIUM 100 MCG PO TABS
100.0000 ug | ORAL_TABLET | Freq: Every day | ORAL | Status: DC
  Administered 2023-11-01: 100 ug via ORAL
  Filled 2023-10-31: qty 1

## 2023-10-31 MED ORDER — CLOPIDOGREL BISULFATE 75 MG PO TABS
75.0000 mg | ORAL_TABLET | Freq: Every day | ORAL | Status: DC
  Administered 2023-11-01: 75 mg via ORAL
  Filled 2023-10-31: qty 1

## 2023-10-31 NOTE — Plan of Care (Signed)
  Problem: Education: Goal: Knowledge of General Education information will improve Description: Including pain rating scale, medication(s)/side effects and non-pharmacologic comfort measures Outcome: Progressing   Problem: Nutrition: Goal: Adequate nutrition will be maintained Outcome: Progressing   Problem: Coping: Goal: Level of anxiety will decrease Outcome: Progressing   Problem: Pain Managment: Goal: General experience of comfort will improve and/or be controlled Outcome: Progressing   Problem: Safety: Goal: Ability to remain free from injury will improve Outcome: Progressing   Problem: Coping: Goal: Ability to adjust to condition or change in health will improve Outcome: Progressing

## 2023-10-31 NOTE — Progress Notes (Signed)
 PROGRESS NOTE    Patient: Martha Lozano                            PCP: Patient, [name] Per                    DOB: 02/20/1961            DOA: 10/29/2023 RUE:[id number]             DOS: 10/31/2023, 12:19 PM   LOS: 2 days   Date of Service: The patient was seen and examined on 10/31/2023  Subjective:   The patient was seen and examined this morning, stable no acute distress Briefly been off oxygen--currently on room air satting 96% With complaint of shortness of breath with minimal exertion  Brief Narrative:    Emmie Frakes is a 63 y.o. female with medical history significant of hypertension, hyperlipidemia, type 2 diabetes mellitus, peripheral vascular disease who presents emergency department from home via EMS due to altered mental status.  Patient was reported to be lethargic all day today, so EMS was activated, on arrival of EMS team, patient was noted to have an O2 sat of 70% on room air and was placed on supplemental oxygen at 15 LPM with improvement in O2 sat to 100%.   Patient was recently admitted from 4/7 to 4/9 due to critical limb ischemia of left lower extremity, with which she had an endarterectomy and femoral-popliteal bypass.   ED Course: she was tachycardic, tachypneic, BP was 101/64, O2 sat was 98% on 4 LPM.  Temperature 99.7 F.  Leukocytosis and normocytic anemia.  BMP was normal except for sodium of 133 and blood glucose of 181.  Albumin 3.1.  Lactic acid was normal, troponin x 2 was normal, ammonia was 13, urinalysis was positive for glycosuria and large leukocytes.  Urine drug screen was positive for cocaine. CT head without contrast showed no acute intracranial abnormality CT angiography of chest with contrast showed no evidence of pulmonary embolism, but was positive for multilobar pneumonia. Chest x-ray was suggestive of right-sided pneumonia. Patient was treated with IV ceftriaxone and azithromycin, IV hydration was provided.    Assessment  & Plan:   Principal Problem:   Sepsis due to pneumonia [facility]) Active Problems:   Drug abuse (HCC)   Acute respiratory failure with hypoxia (HCC)   Uncontrolled type 2 diabetes mellitus with hyperglycemia, with long-term current use of insulin (HCC)   Hypoalbuminemia due to protein-calorie malnutrition (HCC)   Acquired hypothyroidism   Essential hypertension   Mixed hyperlipidemia   Left leg swelling  Assessment/Plan     Sepsis due to multifocal pneumonia - Improved sepsis physiology -Has been weaned off 4 L of oxygen, currently satting 96% at rest -Still complaining of cough and shortness of breath with minimal exertion -Otherwise hemodynamically stable  -not O2 dependent at baseline  POA: met sepsis criteria due to tachycardia, tachypnea, leukocytosis and with source of infection being pneumonia. - Cont. IV Abx of  continue ceftriaxone and Unasyn-anticipating de-escalation in a.m. - will Follow up with Blood culture, sputum culture, urine Legionella, strep pneumo  - Lactic acid 1.4 Continue Tylenol as needed Continue Mucinex, incentive spirometry, flutter valve    Acute respiratory failure with hypoxia Due to acute pneumonia, improving, has been weaned off to room air De-escalating-weaning off oxygen Continue nebs, considering adding IV steroids   Possible UTI: Asymptomatic UA positive for some leukocyte esterase,  rare bacteria, WBC of 21-50 -Ordered IV antibiotics of Rocephin - Follow-up urine culture reported multi species present,-anticipating repeating If symptomatic   Left leg swelling This may be due to recent endarterectomy and femoral-popliteal bypass. Lower extremity ultrasound will be done to rule out DVT   Hyperglycemia secondary to type 2 diabetes mellitus Globin A1c 7.8 on/7/25 Continue Semglee 5 units nightly and adjust dose accordingly Continue ISS and hypoglycemia protocol   Drug abuse Urine drug screen was positive for cocaine Patient was  consulted on cocaine use cessation    Hypoalbuminemia possibly due to mild protein calorie malnutrition Albumin 3.1, protein supplement will be provided   Acquired hypothyroidism Continue Synthroid   Essential hypertension BP meds will be held at this time due to soft BP   Anemia of chronic disease  - Monitoring H&H closely, no signs of bleeding    Latest Ref Rng & Units 10/31/2023    5:20 AM 10/30/2023    2:33 AM 10/29/2023    3:27 PM  CBC  WBC 4.0 - 10.5 K/uL 7.9  11.6  13.5   Hemoglobin 12.0 - 15.0 g/dL 7.7  7.4  8.9   Hematocrit 36.0 - 46.0 % 24.8  23.7  28.9   Platelets 150 - 400 K/uL 214  212  214    Obtaining iron level and Hemoccult   mixed hyperlipidemia Continue Lipitor   ------------------------------------------------------------------------------------------------------------------------------------ Nutritional status:  The patient's BMI is: Body mass index is 19.81 kg/m. I agree with the assessment and plan as outlined ---------------------------------------------------------------------------------------------------------------------------------------- Cultures; Blood Cultures x 2 >>  Sputum Culture >>   ------------------------------------------------------------------------------------------------------------------------------------------------  DVT prophylaxis:  SCDs Start: 10/29/23 2030   Code Status:   Code Status: Full Code  Family Communication: Fianc at bedside updated . -Advance care planning has been discussed.   Admission status:   Status is: Inpatient Remains inpatient appropriate because: Requiring respiratory support, IV fluids, IV antibiotics   Disposition: From  - home             Planning for discharge in 2 days: to Home w [facility]    Procedures:   No admission procedures for hospital encounter.   Antimicrobials:  Anti-infectives (From admission, onward)    Start     Dose/Rate Route Frequency Ordered Stop   10/30/23 1000   cefTRIAXone (ROCEPHIN) 2 g in sodium chloride 0.9 % 100 mL IVPB  Status:  Discontinued        2 g 200 mL/hr over 30 Minutes Intravenous Every 24 hours 10/29/23 2031 10/29/23 2035   10/30/23 1000  Ampicillin-Sulbactam (UNASYN) 3 g in sodium chloride 0.9 % 100 mL IVPB        3 g 200 mL/hr over 30 Minutes Intravenous Every 6 hours 10/29/23 2035     10/30/23 1000  azithromycin (ZITHROMAX) 500 mg in sodium chloride 0.9 % 250 mL IVPB        500 mg 250 mL/hr over 60 Minutes Intravenous Every 24 hours 10/29/23 2035     10/29/23 1615  cefTRIAXone (ROCEPHIN) 1 g in sodium chloride 0.9 % 100 mL IVPB        1 g 200 mL/hr over 30 Minutes Intravenous  Once 10/29/23 1603 10/29/23 1710   10/29/23 1615  azithromycin (ZITHROMAX) 500 mg in sodium chloride 0.9 % 250 mL IVPB        500 mg 250 mL/hr over 60 Minutes Intravenous  Once 10/29/23 1603 10/29/23 1834        Medication:   Chlorhexidine Gluconate Cloth  6 each Topical Daily   dextromethorphan-guaiFENesin  1 tablet Oral BID   feeding supplement (GLUCERNA SHAKE)  237 mL Oral TID BM   insulin aspart  0-5 Units Subcutaneous QHS   insulin aspart  0-9 Units Subcutaneous TID WC   insulin glargine-yfgn  5 Units Subcutaneous QHS    acetaminophen **OR** acetaminophen, ondansetron **OR** ondansetron (ZOFRAN) IV   Objective:   Vitals:   10/31/23 0800 10/31/23 0818 10/31/23 0900 10/31/23 1206  BP: (!) 169/73  (!) 143/59   Pulse: 95  91   Resp: 14  16   Temp:  98.1 F (36.7 C)  98.6 F (37 C)  TempSrc:  Oral  Oral  SpO2: 97%  96%   Weight:      Height:        Intake/Output Summary (Last 24 hours) at 10/31/2023 1219 Last data filed at 10/31/2023 0341 Gross per 24 hour  Intake 1048.15 ml  Output --  Net 1048.15 ml   Filed Weights   10/29/23 1503 10/29/23 2100  Weight: 42.6 kg 46 kg     Physical examination:         General:  AAO x 3,  cooperative, no distress;   HEENT:  Normocephalic, PERRL, otherwise with in Normal limits    Neuro:  CNII-XII intact. , normal motor and sensation, reflexes intact   Lungs:   Clear to auscultation BL, Respirations unlabored,  No wheezes / crackles  Cardio:    S1/S2, RRR, No murmure, No Rubs or Gallops   Abdomen:  Soft, non-tender, bowel sounds active all four quadrants, no guarding or peritoneal signs.  Muscular  skeletal:  Limited exam -global generalized weaknesses - in bed, able to move all 4 extremities,   2+ pulses,  symmetric, No pitting edema  Skin:  Dry, warm to touch, negative for any Rashes,  Wounds: Please see nursing documentation         ------------------------------------------------------------------------------------------------------------------------------------------    LABs:     Latest Ref Rng & Units 10/31/2023    5:20 AM 10/30/2023    2:33 AM 10/29/2023    3:27 PM  CBC  WBC 4.0 - 10.5 K/uL 7.9  11.6  13.5   Hemoglobin 12.0 - 15.0 g/dL 7.7  7.4  8.9   Hematocrit 36.0 - 46.0 % 24.8  23.7  28.9   Platelets 150 - 400 K/uL 214  212  214       Latest Ref Rng & Units 10/31/2023    5:20 AM 10/30/2023    2:33 AM 10/29/2023    3:27 PM  CMP  Glucose 70 - 99 mg/dL 161  94  096   BUN 8 - 23 mg/dL 10  10  14    Creatinine 0.44 - 1.00 mg/dL 0.45  4.09  8.11   Sodium 135 - 145 mmol/L 137  138  133   Potassium 3.5 - 5.1 mmol/L 3.5  3.5  3.8   Chloride 98 - 111 mmol/L 103  111  101   CO2 22 - 32 mmol/L 26  25  23    Calcium 8.9 - 10.3 mg/dL 7.9  8.1  8.3   Total Protein 6.5 - 8.1 g/dL  5.3  6.2   Total Bilirubin 0.0 - 1.2 mg/dL  0.5  0.4   Alkaline Phos 38 - 126 U/L  55  64   AST 15 - 41 U/L  19  21   ALT 0 - 44 U/L  12  12  Micro Results Recent Results (from the past 240 hours)  Culture, blood (routine x 2)     Status: None (Preliminary result)   Collection Time: 10/29/23  3:24 PM   Specimen: BLOOD  Result Value Ref Range Status   Specimen Description BLOOD RIGHT HAND  Final   Special Requests   Final    BOTTLES DRAWN AEROBIC ONLY Blood  Culture results may not be optimal due to an inadequate volume of blood received in culture bottles   Culture   Final    NO GROWTH 2 DAYS Performed at [facility], [address], [place], [place] [address]    Report Status PENDING  Incomplete  Culture, blood (routine x 2)     Status: None (Preliminary result)   Collection Time: 10/29/23  3:27 PM   Specimen: BLOOD  Result Value Ref Range Status   Specimen Description BLOOD LEFT WRIST  Final   Special Requests   Final    BOTTLES DRAWN AEROBIC AND ANAEROBIC Blood Culture results may not be optimal due to an inadequate volume of blood received in culture bottles   Culture   Final    NO GROWTH 2 DAYS Performed at [facility], [address], [place], [place] [address]    Report Status PENDING  Incomplete  Urine Culture     Status: Abnormal   Collection Time: 10/29/23  6:34 PM   Specimen: Urine, Random  Result Value Ref Range Status   Specimen Description   Final    URINE, RANDOM Performed at [facility], [address] [place], [place] [address]    Special Requests   Final    NONE Reflexed from [phone] Performed at [facility], [address], [place], [place] [address]    Culture MULTIPLE SPECIES PRESENT, SUGGEST RECOLLECTION (A)  Final   Report Status 10/31/2023 FINAL  Final  Resp panel by RT-PCR (RSV, Flu A&B, Covid) Anterior Nasal Swab     Status: None   Collection Time: 10/29/23  7:34 PM   Specimen: Anterior Nasal Swab  Result Value Ref Range Status   SARS Coronavirus 2 by RT PCR NEGATIVE NEGATIVE Final    Comment: (NOTE) SARS-CoV-2 target nucleic acids are NOT DETECTED.  The SARS-CoV-2 RNA is generally detectable in upper respiratory specimens during the acute phase of infection. The lowest concentration of SARS-CoV-2 viral copies this assay can detect is 138 copies/mL. A negative result does not preclude SARS-Cov-2 infection and should not be used as the sole basis for treatment or other patient management decisions. A  negative result may occur with  improper specimen collection/handling, submission of specimen other than nasopharyngeal swab, presence of viral mutation(s) within the areas targeted by this assay, and inadequate number of viral copies(<138 copies/mL). A negative result must be combined with clinical observations, patient history, and epidemiological information. The expected result is Negative.  Fact Sheet for Patients:  BloggerCourse.com  Fact Sheet for Healthcare Providers:  SeriousBroker.it  This test is no t yet approved or cleared by the [place] FDA and  has been authorized for detection and/or diagnosis of SARS-CoV-2 by FDA under an Emergency Use Authorization (EUA). This EUA will remain  in effect (meaning this test can be used) for the duration of the COVID-19 declaration under Section 564(b)(1) of the Act, 21 U.S.C.section 360bbb-3(b)(1), unless the authorization is terminated  or revoked sooner.       Influenza A by PCR NEGATIVE NEGATIVE Final   Influenza B by PCR NEGATIVE NEGATIVE Final  Comment: (NOTE) The Xpert Xpress SARS-CoV-2/FLU/RSV plus assay is intended as an aid in the diagnosis of influenza from Nasopharyngeal swab specimens and should not be used as a sole basis for treatment. Nasal washings and aspirates are unacceptable for Xpert Xpress SARS-CoV-2/FLU/RSV testing.  Fact Sheet for Patients: BloggerCourse.com  Fact Sheet for Healthcare Providers: SeriousBroker.it  This test is not yet approved or cleared by the [place] FDA and has been authorized for detection and/or diagnosis of SARS-CoV-2 by FDA under an Emergency Use Authorization (EUA). This EUA will remain in effect (meaning this test can be used) for the duration of the COVID-19 declaration under Section 564(b)(1) of the Act, 21 U.S.C. section 360bbb-3(b)(1), unless the authorization  is terminated or revoked.     Resp Syncytial Virus by PCR NEGATIVE NEGATIVE Final    Comment: (NOTE) Fact Sheet for Patients: BloggerCourse.com  Fact Sheet for Healthcare Providers: SeriousBroker.it  This test is not yet approved or cleared by the [place] FDA and has been authorized for detection and/or diagnosis of SARS-CoV-2 by FDA under an Emergency Use Authorization (EUA). This EUA will remain in effect (meaning this test can be used) for the duration of the COVID-19 declaration under Section 564(b)(1) of the Act, 21 U.S.C. section 360bbb-3(b)(1), unless the authorization is terminated or revoked.  Performed at [facility], [address] [place], [place] [address]   MRSA Next Gen by PCR, Nasal     Status: None   Collection Time: 10/29/23  8:50 PM   Specimen: Nasal Mucosa; Nasal Swab  Result Value Ref Range Status   MRSA by PCR Next Gen NOT DETECTED NOT DETECTED Final    Comment: (NOTE) The GeneXpert MRSA Assay (FDA approved for NASAL specimens only), is one component of a comprehensive MRSA colonization surveillance program. It is not intended to diagnose MRSA infection nor to guide or monitor treatment for MRSA infections. Test performance is not FDA approved in patients less than 77 years old. Performed at [facility], [address] [place], [place] [address]     Radiology Reports US Venous Img Lower Unilateral Left (DVT) Result Date: 10/30/2023 CLINICAL DATA:  Left lower extremity swelling EXAM: LEFT LOWER EXTREMITY VENOUS DOPPLER ULTRASOUND TECHNIQUE: Gray-scale sonography with compression, as well as color and duplex ultrasound, were performed to evaluate the deep venous system(s) from the level of the common femoral vein through the popliteal and proximal calf veins. COMPARISON:  None Available. FINDINGS: VENOUS Normal compressibility of the common femoral, superficial femoral, and popliteal veins, as well as  the visualized calf veins. Visualized portions of profunda femoral vein is unremarkable. No filling defects to suggest DVT on grayscale or color Doppler imaging. Doppler waveforms show normal direction of venous flow, normal respiratory plasticity and response to augmentation. Limited views of the contralateral common femoral vein are unremarkable. OTHER Postsurgical changes of left greater saphenous vein harvest are noted. Limitations: none IMPRESSION: No DVT of the left lower extremity. Electronically Signed   By: Acquanetta Belling M.D.   On: 10/30/2023 15:49    SIGNED: Kendell Bane, MD, FHM. FAAFP. [facility] - [facility] Time spent - 55 min.  In seeing, evaluating and examining the patient. Reviewing medical records, labs, drawn plan of care. [facility],  Pager (please use [email or web address] to page/ text) Please use Epic Secure Chat for non-urgent communication (7AM-7PM)  If 7PM-7AM, please contact night-coverage [email or web address], 10/31/2023, 12:19 PM

## 2023-11-01 DIAGNOSIS — A419 Sepsis, unspecified organism: Secondary | ICD-10-CM | POA: Diagnosis not present

## 2023-11-01 DIAGNOSIS — J189 Pneumonia, unspecified organism: Secondary | ICD-10-CM | POA: Diagnosis not present

## 2023-11-01 LAB — BASIC METABOLIC PANEL WITH GFR
Anion gap: 7 (ref 5–15)
BUN: 9 mg/dL (ref 8–23)
CO2: 28 mmol/L (ref 22–32)
Calcium: 8.1 mg/dL — ABNORMAL LOW (ref 8.9–10.3)
Chloride: 102 mmol/L (ref 98–111)
Creatinine, Ser: 0.52 mg/dL (ref 0.44–1.00)
GFR, Estimated: >60 mL/min (ref >60)
Glucose, Bld: 181 mg/dL — ABNORMAL HIGH (ref 70–99)
Potassium: 3.7 mmol/L (ref 3.5–5.1)
Sodium: 137 mmol/L (ref 135–145)

## 2023-11-01 LAB — CBC
HCT: 25.8 % — ABNORMAL LOW (ref 36.0–46.0)
Hemoglobin: 8 g/dL — ABNORMAL LOW (ref 12.0–15.0)
MCH: 28.1 pg (ref 26.0–34.0)
MCHC: 31 g/dL (ref 30.0–36.0)
MCV: 90.5 fL (ref 80.0–100.0)
Platelets: 233 10*3/uL (ref 150–400)
RBC: 2.85 MIL/uL — ABNORMAL LOW (ref 3.87–5.11)
RDW: 13.4 % (ref 11.5–15.5)
WBC: 6.5 10*3/uL (ref 4.0–10.5)
nRBC: 0 % (ref 0.0–0.2)

## 2023-11-01 LAB — GLUCOSE, CAPILLARY: Glucose-Capillary: 120 mg/dL — ABNORMAL HIGH (ref 70–99)

## 2023-11-01 LAB — T4, FREE: Free T4: 0.92 ng/dL (ref 0.61–1.12)

## 2023-11-01 MED ORDER — FERROUS SULFATE 325 (65 FE) MG PO TABS: 325.0000 mg | ORAL_TABLET | Freq: Two times a day (BID) | ORAL | Status: DC

## 2023-11-01 MED ORDER — LEVOFLOXACIN 500 MG PO TABS: 500.0000 mg | ORAL_TABLET | Freq: Every day | ORAL | 0 refills | Status: AC

## 2023-11-01 MED ORDER — DM-GUAIFENESIN ER 30-600 MG PO TB12: 1.0000 | ORAL_TABLET | Freq: Two times a day (BID) | ORAL | 0 refills | Status: AC

## 2023-11-01 MED ORDER — SODIUM CHLORIDE 0.9 % IV SOLN
125.0000 mg | Freq: Once | INTRAVENOUS | Status: AC
  Administered 2023-11-01: 125 mg via INTRAVENOUS
  Filled 2023-11-01: qty 5

## 2023-11-01 MED ORDER — LEVOFLOXACIN 500 MG PO TABS
500.0000 mg | ORAL_TABLET | Freq: Every day | ORAL | Status: DC
  Administered 2023-11-01: 500 mg via ORAL
  Filled 2023-11-01: qty 1

## 2023-11-01 MED ORDER — FERROUS SULFATE 325 (65 FE) MG PO TABS: 325.0000 mg | ORAL_TABLET | Freq: Two times a day (BID) | ORAL | 3 refills | Status: DC

## 2023-11-01 NOTE — TOC Transition Note (Signed)
 Transition of Care [facility]) - Discharge Note   Patient Details  Name: Martha Lozano MRN: [id number] Date of Birth: Dec 20, 1960  Transition of Care [facility]) CM/SW Contact:  Sharlyne Stabs, RN Phone Number: 11/01/2023, 10:59 AM   Clinical Narrative:   Patient discharging home, requesting a bed side commode and wants it shipped to her home. Orders placed, referral sent to Zach with [employer].    Final next level of care: Home/Self Care Barriers to Discharge: Barriers Resolved   Patient Goals and CMS Choice Patient states their goals for this hospitalization and ongoing recovery are:: return home CMS Medicare.gov Compare Post Acute Care list provided to:: Patient        Discharge Placement                    Patient and family notified of of transfer: 11/01/23  Discharge Plan and Services Additional resources added to the After Visit Summary for                  DME Arranged: 3-N-1 DME Agency: [employer] Date DME Agency Contacted: 11/01/23 Time DME Agency Contacted: 1044 Representative spoke with at DME Agency: Darlyn            Social Drivers of Health (SDOH) Interventions SDOH Screenings   Food Insecurity: No Food Insecurity (10/29/2023)  Housing: Low Risk  (10/29/2023)  Transportation Needs: No Transportation Needs (10/29/2023)  Utilities: Not At Risk (10/29/2023)  Financial Resource Strain: Low Risk  (04/19/2023)   Received from [facility]  Physical Activity: Insufficiently Active (04/19/2023)   Received from [facility]  Social Connections: Moderately Integrated (04/19/2023)   Received from [facility]  Stress: No Stress Concern Present (04/19/2023)   Received from [facility]  Tobacco Use: High Risk (10/29/2023)  Health Literacy: Low Risk  (04/19/2023)   Received from [facility]     Readmission Risk Interventions    10/23/2023   10:12 AM 03/07/2023   11:41 AM 02/28/2023    3:07 PM  Readmission Risk Prevention Plan   Transportation Screening Complete Complete Complete  PCP or Specialist Appt within 3-5 Days   Complete  Home Care Screening Complete    Medication Review (RN CM) Complete    HRI or Home Care Consult   Complete  Social Work Consult for Recovery Care Planning/Counseling   Complete  Palliative Care Screening   Complete  Medication Review [employer])  Complete Complete  HRI or Home Care Consult  Complete   SW Recovery Care/Counseling Consult  Complete   Palliative Care Screening  Not Applicable   Skilled Nursing Facility  Not Applicable

## 2023-11-01 NOTE — Discharge Summary (Signed)
 Physician Discharge Summary   Patient: Martha Lozano MRN: [id number] DOB: [birth year]  Admit date:     10/29/2023  Discharge date: 11/01/23  Discharge Physician: Bobbetta Burnet   PCP: Patient, [name] Per   Recommendations at discharge:  Follow-up with the PCP in 1 week. Avoid tobacco and tobacco products, avoid alcohol-and street drugs.  Discharge Diagnoses: Principal Problem:   Sepsis due to pneumonia [facility]) Active Problems:   Drug abuse (HCC)   Acute respiratory failure with hypoxia (HCC)   Uncontrolled type 2 diabetes mellitus with hyperglycemia, with long-term current use of insulin  (HCC)   Hypoalbuminemia due to protein-calorie malnutrition (HCC)   Acquired hypothyroidism   Essential hypertension   Mixed hyperlipidemia   Left leg swelling  Resolved Problems:   * No resolved hospital problems. *  Hospital Course:  Martha Lozano is a 63 y.o. female with medical history significant of hypertension, hyperlipidemia, type 2 diabetes mellitus, peripheral vascular disease who presents emergency department from home via EMS due to altered mental status.  Patient was reported to be lethargic all day today, so EMS was activated, on arrival of EMS team, patient was noted to have an O2 sat of 70% on room air and was placed on supplemental oxygen at 15 LPM with improvement in O2 sat to 100%.   Patient was recently admitted from 4/7 to 4/9 due to critical limb ischemia of left lower extremity, with which she had an endarterectomy and femoral-popliteal bypass.   ED Course: she was tachycardic, tachypneic, BP was 101/64, O2 sat was 98% on 4 LPM.  Temperature 99.7 F.  Leukocytosis and normocytic anemia.  BMP was normal except for sodium of 133 and blood glucose of 181.  Albumin  3.1.  Lactic acid was normal, troponin x 2 was normal, ammonia was 13, urinalysis was positive for glycosuria and large leukocytes.  Urine drug screen was positive for cocaine. CT head without  contrast showed no acute intracranial abnormality CT angiography of chest with contrast showed no evidence of pulmonary embolism, but was positive for multilobar pneumonia. Chest x-ray was suggestive of right-sided pneumonia. Patient was treated with IV ceftriaxone  and azithromycin , IV hydration was provided.      Sepsis due to multifocal pneumonia -Solved -Improved weaned off supplemental oxygen from 4 L, now down to room air, satting 98% -Coughing, shortness of breath  -Otherwise hemodynamically stable -not O2 dependent at baseline  POA: met sepsis criteria due to tachycardia, tachypnea, leukocytosis and with source of infection being pneumonia. - Cont. IV Abx of  continue ceftriaxone  and Unasyn -anticipating de-escalation in a.m. - will Follow up with Blood culture, sputum culture, urine Legionella, strep pneumo  - Lactic acid 1.4 Continue Tylenol  as needed Continue Mucinex , incentive spirometry, flutter valve    Acute respiratory failure with hypoxia Due to acute pneumonia, improved, on room air now satting 99% De-escalating-weaning off oxygen Continue nebs, considering adding IV steroids  UTI: Asymptomatic UA positive for some leukocyte esterase, rare bacteria, WBC of 21-50 -Ordered IV antibiotics of Rocephin --2 p.o. Levaquin  - Follow-up urine culture reported multi species present, Left leg swelling This may be due to recent endarterectomy and femoral-popliteal bypass. Lower extremity ultrasound will be done to rule out DVT   Hyperglycemia secondary to type 2 diabetes mellitus Globin A1c 7.8 on/7/25 Continue Semglee  5 units nightly and adjust dose accordingly Continue ISS and hypoglycemia protocol   Drug abuse Urine drug screen was positive for cocaine Patient was consulted on cocaine use cessation    Hypoalbuminemia  possibly due to mild protein calorie malnutrition Albumin  3.1, protein supplement will be provided   Acquired hypothyroidism Continue Synthroid     Essential hypertension BP meds will be held at this time due to soft BP   Anemia of chronic disease  - Monitoring H&H closely, no signs of bleeding    Latest Ref Rng & Units 11/01/2023    4:36 AM 10/31/2023    5:20 AM 10/30/2023    2:33 AM  CBC  WBC 4.0 - 10.5 K/uL 6.5  7.9  11.6   Hemoglobin 12.0 - 15.0 g/dL 8.0  7.7  7.4   Hematocrit 36.0 - 46.0 % 25.8  24.8  23.7   Platelets 150 - 400 K/uL 233  214  212     Anemia workup revealed severe iron deficiency anemia, Hemoccult negative - Patient prescribed supplemental iron, - Close follow-up with PCP   mixed hyperlipidemia Continue Lipitor   Disposition: Home Diet recommendation:  Discharge Diet Orders (From admission, onward)     Start     Ordered   11/01/23 0000  Diet - low sodium heart healthy        11/01/23 0937           Cardiac diet DISCHARGE MEDICATION: Allergies as of 11/01/2023   No Known Allergies      Medication List     TAKE these medications    aspirin  EC 81 MG tablet Take 1 tablet (81 mg total) by mouth daily. Swallow whole.   atorvastatin  40 MG tablet Commonly known as: LIPITOR Take 1 tablet (40 mg total) by mouth daily.   clopidogrel  75 MG tablet Commonly known as: PLAVIX  Take 1 tablet (75 mg total) by mouth daily at 6 (six) AM.   dextromethorphan -guaiFENesin  30-600 MG 12hr tablet Commonly known as: MUCINEX  DM Take 1 tablet by mouth 2 (two) times daily for 5 days.   ferrous sulfate  325 (65 FE) MG tablet Take 1 tablet (325 mg total) by mouth 2 (two) times daily with a meal. Start taking on: November 02, 2023   gabapentin  800 MG tablet Commonly known as: NEURONTIN  Take 800 mg by mouth 3 (three) times daily.   HYDROcodone -acetaminophen  5-325 MG tablet Commonly known as: NORCO/VICODIN Take 1 tablet by mouth every 6 (six) hours as needed for moderate pain (pain score 4-6).   insulin  glargine 100 UNIT/ML injection Commonly known as: LANTUS  Inject 7 Units into the skin at bedtime.    levofloxacin  500 MG tablet Commonly known as: LEVAQUIN  Take 1 tablet (500 mg total) by mouth daily for 3 days. Start taking on: November 02, 2023   levothyroxine  100 MCG tablet Commonly known as: SYNTHROID  Take 1 tablet (100 mcg total) by mouth daily before breakfast.   losartan  50 MG tablet Commonly known as: COZAAR  Take 0.5 tablets (25 mg total) by mouth daily.   naloxone  4 MG/0.1ML Liqd nasal spray kit Commonly known as: NARCAN  Place 1 spray into the nose once.   Suboxone  12-3 MG Film Generic drug: Buprenorphine  HCl-Naloxone  HCl Place 1 Film under the tongue 3 (three) times daily.   venlafaxine  XR 37.5 MG 24 hr capsule Commonly known as: EFFEXOR -XR Take 37.5 mg by mouth daily with breakfast.   Vitamin D  (Ergocalciferol ) 1.25 MG (50000 UNIT) Caps capsule Commonly known as: DRISDOL  Take 50,000 Units by mouth once a week.        Discharge Exam: Filed Weights   10/29/23 1503 10/29/23 2100 10/31/23 1403  Weight: 42.6 kg 46 kg 45.7 kg  General:  AAO x 3,  cooperative, no distress;   HEENT:  Normocephalic, PERRL, otherwise with in Normal limits   Neuro:  CNII-XII intact. , normal motor and sensation, reflexes intact   Lungs:   Clear to auscultation BL, Respirations unlabored,  No wheezes / crackles  Cardio:    S1/S2, RRR, No murmure, No Rubs or Gallops   Abdomen:  Soft, non-tender, bowel sounds active all four quadrants, no guarding or peritoneal signs.  Muscular  skeletal:  Limited exam -global generalized weaknesses - in bed, able to move all 4 extremities,   2+ pulses,  symmetric, No pitting edema  Skin:  Dry, warm to touch, negative for any Rashes,  Wounds: Please see nursing documentation          Condition at discharge: good  The results of significant diagnostics from this hospitalization (including imaging, microbiology, ancillary and laboratory) are listed below for reference.   Imaging Studies: US  Venous Img Lower Unilateral Left  (DVT) Result Date: 10/30/2023 CLINICAL DATA:  Left lower extremity swelling EXAM: LEFT LOWER EXTREMITY VENOUS DOPPLER ULTRASOUND TECHNIQUE: Gray-scale sonography with compression, as well as color and duplex ultrasound, were performed to evaluate the deep venous system(s) from the level of the common femoral vein through the popliteal and proximal calf veins. COMPARISON:  None Available. FINDINGS: VENOUS Normal compressibility of the common femoral, superficial femoral, and popliteal veins, as well as the visualized calf veins. Visualized portions of profunda femoral vein is unremarkable. No filling defects to suggest DVT on grayscale or color Doppler imaging. Doppler waveforms show normal direction of venous flow, normal respiratory plasticity and response to augmentation. Limited views of the contralateral common femoral vein are unremarkable. OTHER Postsurgical changes of left greater saphenous vein harvest are noted. Limitations: none IMPRESSION: No DVT of the left lower extremity. Electronically Signed   By: Elester Grim M.D.   On: 10/30/2023 15:49   CT Head Wo Contrast Result Date: 10/29/2023 CLINICAL DATA:  Mental status change, unknown cause. EXAM: CT HEAD WITHOUT CONTRAST TECHNIQUE: Contiguous axial images were obtained from the base of the skull through the vertex without intravenous contrast. RADIATION DOSE REDUCTION: This exam was performed according to the departmental dose-optimization program which includes automated exposure control, adjustment of the mA and/or kV according to patient size and/or use of iterative reconstruction technique. COMPARISON:  None Available. FINDINGS: Brain: No evidence of acute infarction, hemorrhage, hydrocephalus, extra-axial collection or mass lesion/mass effect. There is bilateral periventricular hypodensity, which is non-specific but most likely seen in the settings of microvascular ischemic changes. Mild in extent. Otherwise normal appearance of brain parenchyma.  Ventricles are normal. Cerebral volume is age appropriate. Vascular: No hyperdense vessel or unexpected calcification. Intracranial arteriosclerosis. Skull: Normal. Negative for fracture or focal lesion. Sinuses/Orbits: No acute finding. Mild mucoperiosteal thickening noted in the bilateral ethmoidal air cells and bilateral maxillary sinus. Other: Visualized mastoid air cells are unremarkable. No mastoid effusion. IMPRESSION: *No acute intracranial abnormality. Electronically Signed   By: Beula Brunswick M.D.   On: 10/29/2023 18:47   CT Angio Chest PE W/Cm &/Or Wo Cm Result Date: 10/29/2023 CLINICAL DATA:  Pulmonary embolism (PE) suspected, high prob. EXAM: CT ANGIOGRAPHY CHEST WITH CONTRAST TECHNIQUE: Multidetector CT imaging of the chest was performed using the standard protocol during bolus administration of intravenous contrast. Multiplanar CT image reconstructions and MIPs were obtained to evaluate the vascular anatomy. RADIATION DOSE REDUCTION: This exam was performed according to the departmental dose-optimization program which includes automated exposure control, adjustment of the  mA and/or kV according to patient size and/or use of iterative reconstruction technique. CONTRAST:  75mL OMNIPAQUE  IOHEXOL  350 MG/ML SOLN COMPARISON:  CT scan chest from 01/30/2021. FINDINGS: Cardiovascular: No evidence of embolism to the proximal subsegmental pulmonary artery level. Normal cardiac size. No pericardial effusion. No aortic aneurysm. Mediastinum/Nodes: Asymmetrically enlarged and heterogeneous left thyroid lobe noted, which is not well evaluated on the current exam. No significant interval change since the prior study. Further evaluation with nonemergent thyroid ultrasound is recommended. No solid / cystic mediastinal masses. The esophagus is nondistended precluding optimal assessment. There are few mildly prominent mediastinal and hilar lymph nodes, which do not meet the size criteria for lymphadenopathy and  appear grossly similar to the prior study, favoring benign etiology. No axillary lymphadenopathy by size criteria. Lungs/Pleura: The central tracheo-bronchial tree is patent. There are extensive heterogeneous opacities throughout bilateral lungs with asymmetrically extensive involvement of right lung lower lobe, compatible with multilobar pneumonia. There also patchy areas of smooth interlobular and intra lobular septal thickening. No lung collapse, pleural effusion or pneumothorax. Upper Abdomen: There is liver surface irregularity/nodularity, compatible with cirrhosis. There are multiple, ill-defined hyperattenuating foci throughout the liver, incompletely characterized on the current exam. Further evaluation with multiphasic contrast-enhanced MRI abdomen as per hepatic mass protocol is recommended. Remaining upper abdominal viscera within normal limits. Musculoskeletal: The visualized soft tissues of the chest wall are grossly unremarkable. No suspicious osseous lesions. There are mild multilevel degenerative changes in the visualized spine. Review of the MIP images confirms the above findings. IMPRESSION: 1. No evidence of embolism to the proximal subsegmental pulmonary artery level. 2. There are extensive heterogeneous opacities throughout bilateral lungs with asymmetrically extensive involvement of right lung lower lobe, compatible with multilobar pneumonia. Follow-up to clearing is recommended. 3. Cirrhotic liver configuration. There are multiple, ill-defined, hyperattenuating foci throughout the liver, incompletely characterized on the current exam. Further evaluation with nonemergent multiphasic contrast-enhanced MRI abdomen as per hepatic mass protocol is recommended. 4. Enlarged and heterogeneous left thyroid lobe, meeting the size criteria for nonemergent follow-up ultrasound evaluation. 5. Multiple other nonacute observations, as described above. Electronically Signed   By: Beula Brunswick M.D.   On:  10/29/2023 18:44   DG Chest Port 1 View Result Date: 10/29/2023 CLINICAL DATA:  Altered mental status. History of breast cancer and diabetes. Postoperative day 8 for left iliofemoral endarterectomy and redo left popliteal artery bypass EXAM: PORTABLE CHEST 1 VIEW COMPARISON:  02/25/2023 FINDINGS: The patient is rotated to the left on today's radiograph, reducing diagnostic sensitivity and specificity. Hazy right hemithoracic opacity is partially due to the leftward rotation causing asymmetric distribution of breast tissues, but I suspect superimposed indistinct nodular perihilar airspace opacity along with indistinct pulmonary vasculature. Possibilities may include pneumonia or unusual manifestation of pulmonary embolus. Metastatic disease to the right chest is not entirely excluded although is felt to be less likely. Atherosclerotic calcification of the aortic arch. No blunting of the costophrenic angles. IMPRESSION: 1. Hazy right hemithoracic opacity is partially due to the leftward rotation causing asymmetric distribution of breast tissues, but I suspect superimposed indistinct nodular perihilar airspace opacity along with indistinct pulmonary vasculature. Possibilities may include right-sided pneumonia or unusual manifestation of pulmonary embolus. Metastatic disease to the right chest is not entirely excluded although is felt to be less likely. CTA chest with contrast may be helpful for further characterization. 2. Aortic Atherosclerosis (ICD10-I70.0). Electronically Signed   By: Freida Jes M.D.   On: 10/29/2023 18:10   VAS US  LOWER EXTREMITY SAPHENOUS  VEIN MAPPING Result Date: 10/07/2023 LOWER EXTREMITY VEIN MAPPING Patient Name:  SMITA RATLIFF Chong  Date of Exam:   10/07/2023 Medical Rec #: [id number]                 Accession #:    [id number] Date of Birth: 1961-03-25                Patient Gender: F Patient Age:   1 years Exam Location:  [facility] Procedure:      VAS US  LOWER  EXTREMITY SAPHENOUS VEIN MAPPING Referring Phys: Angela Kell --------------------------------------------------------------------------------  Indications: Pre-op History:     History of PAD; patient is pre-operative for lower extremity bypass              graft.  Comparison Study: Previous exam 03/04/2023 Performing Technologist: Arlyce Berger RVT, RDMS  Examination Guidelines: A complete evaluation includes B-mode imaging, spectral Doppler, color Doppler, and power Doppler as needed of all accessible portions of each vessel. Bilateral testing is considered an integral part of a complete examination. Limited examinations for reoccurring indications may be performed as noted. +---------------+-----------+----------------------+---------------+-----------+   RT Diameter  RT Findings         GSV            LT Diameter  LT Findings      (cm)                                            (cm)                  +---------------+-----------+----------------------+---------------+-----------+                               Saphenofemoral         0.31                                                   Junction                                  +---------------+-----------+----------------------+---------------+-----------+                               Proximal thigh         0.26                  +---------------+-----------+----------------------+---------------+-----------+                                 Mid thigh            0.29                  +---------------+-----------+----------------------+---------------+-----------+                                Distal thigh          0.29                  +---------------+-----------+----------------------+---------------+-----------+  Knee              0.26                  +---------------+-----------+----------------------+---------------+-----------+                                 Prox calf             0.23       branching  +---------------+-----------+----------------------+---------------+-----------+                                  Mid calf            0.25                  +---------------+-----------+----------------------+---------------+-----------+                                Distal calf           0.25                  +---------------+-----------+----------------------+---------------+-----------+                                   Ankle              0.26                  +---------------+-----------+----------------------+---------------+-----------+ Diagnosing physician: Irvin Mantel Electronically signed by Irvin Mantel on 10/07/2023 at 6:08:51 PM.    Final    PERIPHERAL VASCULAR CATHETERIZATION Result Date: 10/07/2023 Images from the original result were not included. 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 Vikki Graves, MD Procedure Performed: 1.  Percutaneous ultrasound-guided access right common femoral artery 2.  Catheter in aorta and aortogram with bilateral lower extremity angiography 3.  Moderate sedation with fentanyl  and Versed  for 18 minutes Indications: 64 year old female with history of left lower extremity chronic limb threatening ischemia status post common femoral endarterectomy with bypass which subsequently healed her wounds.  She now has recurrent pain in the left lower extremity with toe ulceration and a known occluded bypass graft.  She is indicated for angiography with possible intervention. Findings: The aorta and iliac segments are free of flow-limiting stenosis.  On the right lower extremity there is a flush occlusion of the SFA with runoff via the profunda which reconstitutes above the knee popliteal artery with  three-vessel runoff to the foot.  On the left side the common femoral artery is occluded which reconstitutes to large branches of the profunda the SFA and bypass graft are not visualized.  She reconstitutes distal to the previous bypass graft above the knee with three-vessel runoff to the foot. Patient will require left common femoral endarterectomy with repeat bypass to either the above-knee or below-knee popliteal arteries.  Procedure:  The patient was identified in the holding area and taken to room 8.  The patient was then placed supine on the table and prepped and draped in the usual sterile fashion.  A time out was called.  Ultrasound was used to evaluate the  right common femoral artery.  This was diminutive but patent.  The area was anesthetized 1% lidocaine  and cannulated with micropuncture needle followed by wire and a sheath.  An ultrasound image was saved the permanent record.  Concomitantly we administered fentanyl  and Versed  as moderate sedation her vital signs were monitored throughout the case by bedside nursing.  We placed a Bentson wire followed by 5 [place] sheath and around the catheter to the level of L1 and performed aortogram and then pulled this down to the bifurcation performed bilateral ower extremity angiography.  Catheter was removed over a wire and sheath will be pulled in postoperative holding.  With the above findings we will plan for left lower extremity common femoral endarterectomy and repeat bypass. Contrast: 80 cc Brandon C. Vikki Graves, MD [facility] Office: [phone] Pager: [phone]    Microbiology: Results for orders placed or performed during the hospital encounter of 10/29/23  Culture, blood (routine x 2)     Status: None (Preliminary result)   Collection Time: 10/29/23  3:24 PM   Specimen: BLOOD  Result Value Ref Range Status   Specimen Description BLOOD RIGHT HAND  Final   Special Requests   Final    BOTTLES DRAWN AEROBIC ONLY Blood  Culture results may not be optimal due to an inadequate volume of blood received in culture bottles   Culture   Final    NO GROWTH 3 DAYS Performed at [facility], [address] [place], [place] [address]    Report Status PENDING  Incomplete  Culture, blood (routine x 2)     Status: None (Preliminary result)   Collection Time: 10/29/23  3:27 PM   Specimen: BLOOD  Result Value Ref Range Status   Specimen Description BLOOD LEFT WRIST  Final   Special Requests   Final    BOTTLES DRAWN AEROBIC AND ANAEROBIC Blood Culture results may not be optimal due to an inadequate volume of blood received in culture bottles   Culture   Final    NO GROWTH 3 DAYS Performed at [facility], [address] [place], [place] [address]    Report Status PENDING  Incomplete  Urine Culture     Status: Abnormal   Collection Time: 10/29/23  6:34 PM   Specimen: Urine, Random  Result Value Ref Range Status   Specimen Description   Final    URINE, RANDOM Performed at [facility], [address] [place], [place] [address]    Special Requests   Final    NONE Reflexed from [id number] Performed at [facility], [address] [place], [place] [address]    Culture MULTIPLE SPECIES PRESENT, SUGGEST RECOLLECTION (A)  Final   Report Status 10/31/2023 FINAL  Final  Resp panel by RT-PCR (RSV, Flu A&B, Covid) Anterior Nasal Swab     Status: None   Collection Time: 10/29/23  7:34 PM   Specimen: Anterior Nasal Swab  Result Value Ref Range Status   SARS Coronavirus 2 by RT PCR NEGATIVE NEGATIVE Final    Comment: (NOTE) SARS-CoV-2 target nucleic acids are NOT DETECTED.  The SARS-CoV-2 RNA is generally detectable in upper respiratory specimens during the acute phase of infection. The lowest concentration of SARS-CoV-2 viral copies this assay can detect is 138 copies/mL. A negative result does not preclude SARS-Cov-2 infection and should not be used as the sole basis for treatment or other patient management decisions. A  negative result may occur with  improper specimen collection/handling, submission of specimen other  than nasopharyngeal swab, presence of viral mutation(s) within the areas targeted by this assay, and inadequate number of viral copies(<138 copies/mL). A negative result must be combined with clinical observations, patient history, and epidemiological information. The expected result is Negative.  Fact Sheet for Patients:  BloggerCourse.com  Fact Sheet for Healthcare Providers:  SeriousBroker.it  This test is no t yet approved or cleared by the [place]  FDA and  has been authorized for detection and/or diagnosis of SARS-CoV-2 by FDA under an Emergency Use Authorization (EUA). This EUA will remain  in effect (meaning this test can be used) for the duration of the COVID-19 declaration under Section 564(b)(1) of the Act, 21 U.S.C.section 360bbb-3(b)(1), unless the authorization is terminated  or revoked sooner.       Influenza A by PCR NEGATIVE NEGATIVE Final   Influenza B by PCR NEGATIVE NEGATIVE Final    Comment: (NOTE) The Xpert Xpress SARS-CoV-2/FLU/RSV plus assay is intended as an aid in the diagnosis of influenza from Nasopharyngeal swab specimens and should not be used as a sole basis for treatment. Nasal washings and aspirates are unacceptable for Xpert Xpress SARS-CoV-2/FLU/RSV testing.  Fact Sheet for Patients: BloggerCourse.com  Fact Sheet for Healthcare Providers: SeriousBroker.it  This test is not yet approved or cleared by the [place]  FDA and has been authorized for detection and/or diagnosis of SARS-CoV-2 by FDA under an Emergency Use Authorization (EUA). This EUA will remain in effect (meaning this test can be used) for the duration of the COVID-19 declaration under Section 564(b)(1) of the Act, 21 U.S.C. section 360bbb-3(b)(1), unless the authorization  is terminated or revoked.     Resp Syncytial Virus by PCR NEGATIVE NEGATIVE Final    Comment: (NOTE) Fact Sheet for Patients: BloggerCourse.com  Fact Sheet for Healthcare Providers: SeriousBroker.it  This test is not yet approved or cleared by the [place]  FDA and has been authorized for detection and/or diagnosis of SARS-CoV-2 by FDA under an Emergency Use Authorization (EUA). This EUA will remain in effect (meaning this test can be used) for the duration of the COVID-19 declaration under Section 564(b)(1) of the Act, 21 U.S.C. section 360bbb-3(b)(1), unless the authorization is terminated or revoked.  Performed at [facility], [address], [place], [place] [address]   MRSA Next Gen by PCR, Nasal     Status: None   Collection Time: 10/29/23  8:50 PM   Specimen: Nasal Mucosa; Nasal Swab  Result Value Ref Range Status   MRSA by PCR Next Gen NOT DETECTED NOT DETECTED Final    Comment: (NOTE) The GeneXpert MRSA Assay (FDA approved for NASAL specimens only), is one component of a comprehensive MRSA colonization surveillance program. It is not intended to diagnose MRSA infection nor to guide or monitor treatment for MRSA infections. Test performance is not FDA approved in patients less than 64 years old. Performed at [facility], [address], [place], [place] [address]     Labs: CBC: Recent Labs  Lab 10/29/23 1527 10/30/23 0233 10/31/23 0520 11/01/23 0436  WBC 13.5* 11.6* 7.9 6.5  NEUTROABS 12.0*  --   --   --   HGB 8.9* 7.4* 7.7* 8.0*  HCT 28.9* 23.7* 24.8* 25.8*  MCV 91.7 91.9 91.9 90.5  PLT 214 212 214 233   Basic Metabolic Panel: Recent Labs  Lab 10/29/23 1527 10/30/23 0233 10/31/23 0520 11/01/23 0436  NA 133* 138 137 137  K 3.8 3.5 3.5 3.7  CL 101 111 103 102  CO2 23 25 26  28  GLUCOSE 181* 94 173* 181*  BUN 14 10 10 9   CREATININE 0.60 0.52 0.55 0.52  CALCIUM  8.3* 8.1* 7.9* 8.1*  MG  --  1.8   --   --   PHOS  --  3.0  --   --    Liver Function Tests: Recent Labs  Lab 10/29/23 1527 10/30/23 0233  AST 21 19  ALT 12 12  ALKPHOS 64 55  BILITOT 0.4 0.5  PROT 6.2* 5.3*  ALBUMIN  3.1* 2.5*   CBG: Recent Labs  Lab 10/31/23 0737 10/31/23 1131 10/31/23 1619 10/31/23 2000 11/01/23 0743  GLUCAP 125* 236* 173* 141* 120*    Discharge time spent: greater than 30 minutes.  Signed: Bobbetta Burnet, MD [facility] 11/01/2023

## 2023-11-01 NOTE — Progress Notes (Signed)
 Mobility Specialist Progress Note:    11/01/23 0935  Mobility  Activity Ambulated with assistance in hallway  Level of Assistance Standby assist, set-up cues, supervision of patient - no hands on  Assistive Device Front wheel walker  Distance Ambulated (ft) 120 ft  Range of Motion/Exercises Active;All extremities  Activity Response Tolerated well  Mobility Referral Yes  Mobility visit 1 Mobility  Mobility Specialist Start Time (ACUTE ONLY) 0935  Mobility Specialist Stop Time (ACUTE ONLY) 0955  Mobility Specialist Time Calculation (min) (ACUTE ONLY) 20 min   Pt received in bed, family in room. Agreeable to mobility, required supervision to stand and ambulate with RW. Tolerated well, SpO2 96-99% on RA during ambulation. Left pt sitting EOB, all needs met.  Nashton Belson Mobility Specialist Please contact via Special educational needs teacher or  Rehab office at [phone]

## 2023-11-02 LAB — LEGIONELLA PNEUMOPHILA SEROGP 1 UR AG: L. pneumophila Serogp 1 Ur Ag: NEGATIVE

## 2023-11-02 LAB — T3, FREE: T3, Free: 2.5 pg/mL (ref 2.0–4.4)

## 2023-11-03 LAB — CULTURE, BLOOD (ROUTINE X 2)
Culture: NO GROWTH
Culture: NO GROWTH

## 2023-11-20 NOTE — Progress Notes (Unsigned)
  POST OPERATIVE OFFICE NOTE    CC:  F/u for surgery  HPI:  This is a 63 y.o. female who is s/p left iliofemoral endarterectomy, extended onto the profunda with saphenous vein patch, left femoral to below knee bypass graft with non reversed GSV on 10/21/2023 by Dr. Rosalva Comber.    Pt returns today for follow up.  Pt states ***   No Known Allergies  Current Outpatient Medications  Medication Sig Dispense Refill   aspirin  EC 81 MG tablet Take 1 tablet (81 mg total) by mouth daily. Swallow whole. 30 tablet 0   atorvastatin  (LIPITOR) 40 MG tablet Take 1 tablet (40 mg total) by mouth daily. (Patient not taking: Reported on 10/29/2023) 30 tablet 0   Buprenorphine  HCl-Naloxone  HCl (SUBOXONE ) 12-3 MG FILM Place 1 Film under the tongue 3 (three) times daily.     clopidogrel  (PLAVIX ) 75 MG tablet Take 1 tablet (75 mg total) by mouth daily at 6 (six) AM. (Patient not taking: Reported on 10/29/2023) 30 tablet 0   ferrous sulfate  325 (65 FE) MG tablet Take 1 tablet (325 mg total) by mouth 2 (two) times daily with a meal. 60 tablet 3   gabapentin  (NEURONTIN ) 800 MG tablet Take 800 mg by mouth 3 (three) times daily.     HYDROcodone -acetaminophen  (NORCO/VICODIN) 5-325 MG tablet Take 1 tablet by mouth every 6 (six) hours as needed for moderate pain (pain score 4-6). 20 tablet 0   insulin  glargine (LANTUS ) 100 UNIT/ML injection Inject 7 Units into the skin at bedtime.     levothyroxine  (SYNTHROID ) 100 MCG tablet Take 1 tablet (100 mcg total) by mouth daily before breakfast. (Patient not taking: Reported on 10/29/2023)     losartan  (COZAAR ) 50 MG tablet Take 0.5 tablets (25 mg total) by mouth daily. (Patient not taking: Reported on 10/29/2023)     naloxone  (NARCAN ) nasal spray 4 mg/0.1 mL Place 1 spray into the nose once.     venlafaxine  XR (EFFEXOR -XR) 37.5 MG 24 hr capsule Take 37.5 mg by mouth daily with breakfast. (Patient not taking: Reported on 10/29/2023)     Vitamin D , Ergocalciferol , (DRISDOL ) 1.25 MG (50000  UNIT) CAPS capsule Take 50,000 Units by mouth once a week. (Patient not taking: Reported on 10/29/2023)     No current facility-administered medications for this visit.     ROS:  See HPI  Physical Exam:  ***  Incision:  *** Extremities:  *** Neuro: *** Abdomen:  ***    Assessment/Plan:  This is a 63 y.o. female who is s/p:  left iliofemoral endarterectomy, extended onto the profunda with saphenous vein patch, left femoral to below knee bypass graft with non reversed GSV on 10/21/2023 by Dr. Rosalva Comber.    -*** -continue asa/statin/plavix  -f/u in *** with *** on Dr. Rosalva Comber clinic day  Maryanna Smart, [facility] [phone]   Clinic MD:  Rosalva Comber

## 2023-11-21 ENCOUNTER — Encounter: Payer: Self-pay | Admitting: Physician Assistant

## 2023-11-21 ENCOUNTER — Telehealth: Payer: Self-pay

## 2023-11-21 ENCOUNTER — Ambulatory Visit: Attending: Vascular Surgery | Admitting: Physician Assistant

## 2023-11-21 VITALS — BP 153/87 | HR 93 | Temp 98.5°F | Wt 97.4 lb

## 2023-11-21 DIAGNOSIS — I70222 Atherosclerosis of native arteries of extremities with rest pain, left leg: Secondary | ICD-10-CM

## 2023-11-21 DIAGNOSIS — I70221 Atherosclerosis of native arteries of extremities with rest pain, right leg: Secondary | ICD-10-CM

## 2023-11-21 NOTE — Telephone Encounter (Signed)
 Attempted to call for surgery scheduling. LVM

## 2023-11-28 ENCOUNTER — Ambulatory Visit: Admitting: Podiatry

## 2023-12-03 ENCOUNTER — Telehealth: Payer: Self-pay

## 2023-12-03 ENCOUNTER — Other Ambulatory Visit: Payer: Self-pay

## 2023-12-03 DIAGNOSIS — I70221 Atherosclerosis of native arteries of extremities with rest pain, right leg: Secondary | ICD-10-CM

## 2023-12-03 NOTE — Telephone Encounter (Signed)
 Attempted to call for surgery scheduling. LVM

## 2023-12-11 ENCOUNTER — Encounter (HOSPITAL_COMMUNITY)
Admission: RE | Disposition: A | Discharge status: Home / Self Care | Payer: Self-pay | Source: Home / Self Care | Attending: Vascular Surgery

## 2023-12-11 ENCOUNTER — Ambulatory Visit (HOSPITAL_COMMUNITY)
Admission: RE | Admit: 2023-12-11 | Discharge: 2023-12-11 | Disposition: A | Discharge status: Home / Self Care | Attending: Vascular Surgery | Admitting: Vascular Surgery

## 2023-12-11 ENCOUNTER — Other Ambulatory Visit: Payer: Self-pay

## 2023-12-11 DIAGNOSIS — Z7982 Long term (current) use of aspirin: Secondary | ICD-10-CM | POA: Insufficient documentation

## 2023-12-11 DIAGNOSIS — Z539 Procedure and treatment not carried out, unspecified reason: Secondary | ICD-10-CM | POA: Insufficient documentation

## 2023-12-11 DIAGNOSIS — Z7902 Long term (current) use of antithrombotics/antiplatelets: Secondary | ICD-10-CM | POA: Insufficient documentation

## 2023-12-11 DIAGNOSIS — L97419 Non-pressure chronic ulcer of right heel and midfoot with unspecified severity: Secondary | ICD-10-CM | POA: Insufficient documentation

## 2023-12-11 DIAGNOSIS — E1151 Type 2 diabetes mellitus with diabetic peripheral angiopathy without gangrene: Secondary | ICD-10-CM | POA: Insufficient documentation

## 2023-12-11 DIAGNOSIS — I70221 Atherosclerosis of native arteries of extremities with rest pain, right leg: Secondary | ICD-10-CM

## 2023-12-11 DIAGNOSIS — E11621 Type 2 diabetes mellitus with foot ulcer: Secondary | ICD-10-CM | POA: Diagnosis not present

## 2023-12-11 DIAGNOSIS — F17200 Nicotine dependence, unspecified, uncomplicated: Secondary | ICD-10-CM | POA: Diagnosis not present

## 2023-12-11 DIAGNOSIS — Z79899 Other long term (current) drug therapy: Secondary | ICD-10-CM | POA: Diagnosis not present

## 2023-12-11 DIAGNOSIS — I70213 Atherosclerosis of native arteries of extremities with intermittent claudication, bilateral legs: Secondary | ICD-10-CM | POA: Diagnosis not present

## 2023-12-11 LAB — POCT I-STAT, CHEM 8
BUN: 18 mg/dL (ref 8–23)
Calcium, Ion: 1.26 mmol/L (ref 1.15–1.40)
Chloride: 96 mmol/L — ABNORMAL LOW (ref 98–111)
Creatinine, Ser: 0.6 mg/dL (ref 0.44–1.00)
Glucose, Bld: 147 mg/dL — ABNORMAL HIGH (ref 70–99)
HCT: 38 % (ref 36.0–46.0)
Hemoglobin: 12.9 g/dL (ref 12.0–15.0)
Potassium: 4.3 mmol/L (ref 3.5–5.1)
Sodium: 136 mmol/L (ref 135–145)
TCO2: 33 mmol/L — ABNORMAL HIGH (ref 22–32)

## 2023-12-11 SURGERY — ABDOMINAL AORTOGRAM
Anesthesia: LOCAL

## 2023-12-11 MED ORDER — SODIUM CHLORIDE 0.9 % IV SOLN: INTRAVENOUS | Status: DC

## 2023-12-11 NOTE — H&P (Signed)
 ` POST OPERATIVE OFFICE NOTE  Patient seen and examined in preop holding.  No complaints. Patient's right heel wound has healed.  No indication for angiogram at this time.  Will cancel.  Kayla Part MD   CC:  F/u for surgery  HPI:  This is a 63 y.o. female who is s/p left iliofemoral endarterectomy, extended onto the profunda with saphenous vein patch, left femoral to below knee bypass graft with non reversed GSV on 10/21/2023 by Dr. Rosalva Comber.    Pt returns today for follow up.  Pt states she does have some swelling in the left leg but her sores on the left foot are improving.  She states the swelling is improved in the mornings.  She states she has a sore on the heel of the right foot.  It has been present for a couple of weeks and started out small but is larger.  It is not improving are worsening currently.  She denies pain or claudication.  She is compliant with her asa and statin and plavix .  She continues to smoke.     No Known Allergies  Current Facility-Administered Medications  Medication Dose Route Frequency Provider Last Rate Last Admin   0.9 %  sodium chloride  infusion   Intravenous Continuous Gera Inboden E, MD         ROS:  See HPI  Physical Exam:  Today's Vitals   12/11/23 0636 12/11/23 0644  BP: 131/80   Pulse: 84   Resp: 14   Temp: 98 F (36.7 C)   TempSrc: Oral   SpO2: 100%   Weight: 45.4 kg 45.4 kg  Height: 5' (1.524 m) 5' (1.524 m)  PainSc:  0-No pain   Body mass index is 19.53 kg/m.   Incision:  left groin and left leg incisions look great.   Extremities:  palpable left DP pulse.  Mild edema left lower extremity; right heel ulcer as pictured.  + doppler flow right PT/pero.   Right heel    Left foot     ABI 07/18/2023 ABI Findings:  +---------+------------------+-----+----------+--------+  Right   Rt Pressure (mmHg)IndexWaveform  Comment   +---------+------------------+-----+----------+--------+  Brachial 151                                         +---------+------------------+-----+----------+--------+  ATA     62                0.41 monophasic          +---------+------------------+-----+----------+--------+  PTA     66                0.44 monophasic          +---------+------------------+-----+----------+--------+  Great Toe52                0.34                     +---------+------------------+-----+----------+--------+   +---------+------------------+-----+----------+-------+  Left    Lt Pressure (mmHg)IndexWaveform  Comment  +---------+------------------+-----+----------+-------+  Brachial 144                                       +---------+------------------+-----+----------+-------+  ATA     87                0.58  monophasic         +---------+------------------+-----+----------+-------+  PTA     84                0.56 monophasic         +---------+------------------+-----+----------+-------+  Great Toe50                0.33                    +---------+------------------+-----+----------+-------+    Assessment/Plan:  This is a 63 y.o. female who is s/p:  left iliofemoral endarterectomy, extended onto the profunda with saphenous vein patch, left femoral to below knee bypass graft with non reversed GSV on 10/21/2023 by Dr. Rosalva Comber.    -as far as the left leg, she has a palpable left DP pulse.  She has mild swelling of the left leg and I discussed with her that she has reperfusion swelling and this is common.  This improves with elevation.  Instructed her to elevate her legs a few times a day to help with swelling and continue to mobilize as well.    -as far as the right leg, she has a new ulceration on the right heel that has worsened over a couple of weeks.  Currently, it is not getting better or worse.  She had decreased ABI in January.  Given the heel wound, I have scheduled her for aortogram with right leg runoff and possible intervention for critical  limb ischemia with heel wound.  I have asked her to float her heel when she is laying in bed to prevent pressure on her heel.  We discussed that he will evaluate her blood flow and she may even need bypass on the right leg but angiogram would determine that.   -continue asa/statin/plavix   -current smoker-long discussion about the importance of smoking cessation.  She is also diabetic.  I discussed with her that she is at higher risk for limb loss especially now that she has a heel wound.  She is also at higher risk for heart attack and stroke.    Maryanna Smart, [facility] [phone]   Clinic MD:  Rosalva Comber

## 2024-01-16 ENCOUNTER — Ambulatory Visit: Payer: Medicaid Other

## 2024-01-16 ENCOUNTER — Ambulatory Visit (HOSPITAL_COMMUNITY): Payer: Medicaid Other

## 2024-01-16 ENCOUNTER — Ambulatory Visit (HOSPITAL_COMMUNITY): Payer: Medicaid Other | Attending: Vascular Surgery

## 2024-02-07 NOTE — Telephone Encounter (Signed)
 Erroneous entry

## 2024-02-12 ENCOUNTER — Other Ambulatory Visit: Payer: Self-pay | Admitting: Hematology and Oncology

## 2024-02-12 DIAGNOSIS — D0511 Intraductal carcinoma in situ of right breast: Secondary | ICD-10-CM

## 2024-02-14 ENCOUNTER — Other Ambulatory Visit: Payer: Self-pay | Admitting: Vascular Surgery

## 2024-02-14 DIAGNOSIS — I70222 Atherosclerosis of native arteries of extremities with rest pain, left leg: Secondary | ICD-10-CM

## 2024-02-18 ENCOUNTER — Encounter

## 2024-02-26 ENCOUNTER — Encounter

## 2024-03-10 NOTE — Progress Notes (Unsigned)
 Office Note   History of Present Illness   Martha Lozano is a 63 y.o. (May 09, 1961) female who presents for surveillance of PAD.  She has a history of left iliofemoral endarterectomy and left femoral to above-knee popliteal artery bypass graft with PTFE on 03/05/2023 by Dr. Lanis.  This was done for critical limb ischemia with tissue loss at the left great toe and heel.  Her bypass eventually occluded, however her previous wounds were able to heal.  She then required repeat left iliofemoral endarterectomy and left femoral to below-knee popliteal artery bypass using nonreversed greater saphenous vein on 10/21/2023 for critical limb ischemia with recurrent left great toe tissue loss.  Her wound again healed after this surgery.  She returns today for follow-up.  She has no complaints at today's visit.  She says that her legs feel great without any pain.  She denies any claudication, rest pain, or tissue loss.  She gets her exercise daily by walking her dog.   Current Outpatient Medications  Medication Sig Dispense Refill   aspirin  EC 81 MG tablet Take 1 tablet (81 mg total) by mouth daily. Swallow whole. 30 tablet 0   Buprenorphine  HCl-Naloxone  HCl (SUBOXONE ) 12-3 MG FILM Place 1 Film under the tongue 3 (three) times daily.     gabapentin  (NEURONTIN ) 800 MG tablet Take 800 mg by mouth 3 (three) times daily.     HYDROcodone -acetaminophen  (NORCO/VICODIN) 5-325 MG tablet Take 1 tablet by mouth every 6 (six) hours as needed for moderate pain (pain score 4-6). (Patient not taking: Reported on 12/05/2023) 20 tablet 0   insulin  glargine (LANTUS ) 100 UNIT/ML injection Inject 6 Units into the skin at bedtime.     Vitamin D , Ergocalciferol , (DRISDOL ) 1.25 MG (50000 UNIT) CAPS capsule Take 50,000 Units by mouth once a week.     No current facility-administered medications for this visit.    REVIEW OF SYSTEMS (negative unless checked):   Cardiac:  []  Chest pain or chest pressure? []  Shortness  of breath upon activity? []  Shortness of breath when lying flat? []  Irregular heart rhythm?  Vascular:  []  Pain in calf, thigh, or hip brought on by walking? []  Pain in feet at night that wakes you up from your sleep? []  Blood clot in your veins? []  Leg swelling?  Pulmonary:  []  Oxygen at home? []  Productive cough? []  Wheezing?  Neurologic:  []  Sudden weakness in arms or legs? []  Sudden numbness in arms or legs? []  Sudden onset of difficult speaking or slurred speech? []  Temporary loss of vision in one eye? []  Problems with dizziness?  Gastrointestinal:  []  Blood in stool? []  Vomited blood?  Genitourinary:  []  Burning when urinating? []  Blood in urine?  Psychiatric:  []  Major depression  Hematologic:  []  Bleeding problems? []  Problems with blood clotting?  Dermatologic:  []  Rashes or ulcers?  Constitutional:  []  Fever or chills?  Ear/Nose/Throat:  []  Change in hearing? []  Nose bleeds? []  Sore throat?  Musculoskeletal:  []  Back pain? []  Joint pain? []  Muscle pain?   Physical Examination   Vitals:   03/12/24 1340  BP: (!) 154/82  Pulse: 94  Temp: 97.7 F (36.5 C)  TempSrc: Temporal  Weight: 102 lb 11.2 oz (46.6 kg)   There is no height or weight on file to calculate BMI.  General:  WDWN in NAD; vital signs documented above Gait: Not observed HENT: WNL, normocephalic Pulmonary: normal non-labored breathing  Cardiac: Regular Abdomen: soft, NT, no masses Skin: without  rashes Vascular Exam/Pulses: Right PT/peroneal Doppler signals.  2+ left DP pulse Extremities: without ischemic changes, without gangrene , without cellulitis; without open wounds;  Musculoskeletal: no muscle wasting or atrophy  Neurologic: A&O X 3;  No focal weakness or paresthesias are detected Psychiatric:  The pt has Normal affect.  Non-Invasive Vascular Imaging ABI (03/12/2024) R:  ABI: 0.49 (0.44),  PT: mono DP: mono TBI:  NA L:  ABI: 0.86 (0.58),  PT: tri DP:  tri TBI: 0.75   LLE Bypass Duplex (03/12/2024) Patent left femoral to below-knee popliteal artery bypass graft without stenosis or sluggish velocities   Medical Decision Making   Martha Lozano is a 63 y.o. female who presents for surveillance of PAD  Based on the patient's vascular studies, her ABIs on the right remains stable at 0.49.  Her ABIs in the left appear improved at 0.86.  This is now her baseline after redo left lower extremity bypass Left lower extremity bypass graft duplex demonstrates a patent left femoral to below-knee popliteal bypass graft without stenosis She is doing great at today's visit without any claudication, rest pain, or tissue loss She has a palpable left DP pulse.  She has monophasic right PT/peroneal Doppler signals She can follow-up with our office in 1 year with repeat left lower extremity bypass graft duplex and ABIs   Ahmed Holster PA-C [facility] [place] Office: [phone]  Call FI:[email or web address]

## 2024-03-12 ENCOUNTER — Ambulatory Visit (HOSPITAL_BASED_OUTPATIENT_CLINIC_OR_DEPARTMENT_OTHER)
Admission: RE | Admit: 2024-03-12 | Discharge: 2024-03-12 | Discharge status: Home / Self Care | Source: Ambulatory Visit | Attending: Vascular Surgery

## 2024-03-12 ENCOUNTER — Ambulatory Visit (HOSPITAL_COMMUNITY)
Admission: RE | Admit: 2024-03-12 | Discharge: 2024-03-12 | Disposition: A | Discharge status: Home / Self Care | Source: Ambulatory Visit | Attending: Vascular Surgery | Admitting: Vascular Surgery

## 2024-03-12 ENCOUNTER — Ambulatory Visit (INDEPENDENT_AMBULATORY_CARE_PROVIDER_SITE_OTHER): Attending: Vascular Surgery | Admitting: Physician Assistant

## 2024-03-12 ENCOUNTER — Ambulatory Visit
Admission: RE | Admit: 2024-03-12 | Discharge: 2024-03-12 | Disposition: A | Discharge status: Home / Self Care | Source: Ambulatory Visit | Attending: Hematology and Oncology | Admitting: Hematology and Oncology

## 2024-03-12 VITALS — BP 154/82 | HR 94 | Temp 97.7°F | Wt 102.7 lb

## 2024-03-12 DIAGNOSIS — I70222 Atherosclerosis of native arteries of extremities with rest pain, left leg: Secondary | ICD-10-CM

## 2024-03-12 DIAGNOSIS — D0511 Intraductal carcinoma in situ of right breast: Secondary | ICD-10-CM

## 2024-03-12 DIAGNOSIS — I739 Peripheral vascular disease, unspecified: Secondary | ICD-10-CM | POA: Insufficient documentation

## 2024-03-12 LAB — VAS US ABI WITH/WO TBI
Left ABI: 0.86
Right ABI: 0.49

## 2024-07-14 ENCOUNTER — Encounter (HOSPITAL_COMMUNITY): Payer: Self-pay

## 2024-07-14 ENCOUNTER — Emergency Department (HOSPITAL_COMMUNITY): Admission: EM | Admit: 2024-07-14 | Discharge: 2024-07-14 | Discharge status: Against Medical Advice

## 2024-07-14 ENCOUNTER — Other Ambulatory Visit: Payer: Self-pay

## 2024-07-14 DIAGNOSIS — R7309 Other abnormal glucose: Secondary | ICD-10-CM | POA: Insufficient documentation

## 2024-07-14 DIAGNOSIS — M79674 Pain in right toe(s): Secondary | ICD-10-CM | POA: Insufficient documentation

## 2024-07-14 DIAGNOSIS — Z5321 Procedure and treatment not carried out due to patient leaving prior to being seen by health care provider: Secondary | ICD-10-CM | POA: Insufficient documentation

## 2024-07-14 LAB — CBG MONITORING, ED: Glucose-Capillary: 341 mg/dL — ABNORMAL HIGH (ref 70–99)

## 2024-07-14 NOTE — ED Triage Notes (Signed)
 Pt reports her right fifth toe is painful and was blue earlier.  Toe is normal color in triage and pt agrees but insists it was dark earlier today.

## 2024-07-20 ENCOUNTER — Telehealth: Payer: Self-pay

## 2024-07-20 NOTE — Telephone Encounter (Signed)
 Patient called reporting new blood blister on the bottom of her right foot and her right great toe and second toe are painful, swelling and red.  Appts made for R LEArt and ABI with PA.

## 2024-07-23 ENCOUNTER — Other Ambulatory Visit: Payer: Self-pay | Admitting: *Deleted

## 2024-07-23 DIAGNOSIS — I739 Peripheral vascular disease, unspecified: Secondary | ICD-10-CM

## 2024-07-23 DIAGNOSIS — I70221 Atherosclerosis of native arteries of extremities with rest pain, right leg: Secondary | ICD-10-CM

## 2024-07-24 ENCOUNTER — Other Ambulatory Visit: Payer: Self-pay | Admitting: Vascular Surgery

## 2024-07-24 ENCOUNTER — Other Ambulatory Visit: Payer: Self-pay

## 2024-07-24 ENCOUNTER — Ambulatory Visit

## 2024-07-24 ENCOUNTER — Telehealth: Payer: Self-pay | Admitting: *Deleted

## 2024-07-24 VITALS — BP 162/84 | Temp 98.1°F | Wt 100.4 lb

## 2024-07-24 DIAGNOSIS — E781 Pure hyperglyceridemia: Secondary | ICD-10-CM | POA: Diagnosis not present

## 2024-07-24 DIAGNOSIS — E08621 Diabetes mellitus due to underlying condition with foot ulcer: Secondary | ICD-10-CM

## 2024-07-24 DIAGNOSIS — H73893 Other specified disorders of tympanic membrane, bilateral: Secondary | ICD-10-CM | POA: Diagnosis not present

## 2024-07-24 DIAGNOSIS — E1165 Type 2 diabetes mellitus with hyperglycemia: Secondary | ICD-10-CM

## 2024-07-24 DIAGNOSIS — Z794 Long term (current) use of insulin: Secondary | ICD-10-CM | POA: Diagnosis not present

## 2024-07-24 DIAGNOSIS — I10 Essential (primary) hypertension: Secondary | ICD-10-CM | POA: Diagnosis not present

## 2024-07-24 DIAGNOSIS — Z130 Encounter for screening for diseases of the blood and blood-forming organs and certain disorders involving the immune mechanism: Secondary | ICD-10-CM | POA: Diagnosis not present

## 2024-07-24 DIAGNOSIS — I70222 Atherosclerosis of native arteries of extremities with rest pain, left leg: Secondary | ICD-10-CM

## 2024-07-24 DIAGNOSIS — L97511 Non-pressure chronic ulcer of other part of right foot limited to breakdown of skin: Secondary | ICD-10-CM | POA: Diagnosis not present

## 2024-07-24 MED ORDER — ACCU-CHEK SOFTCLIX LANCETS MISC: 6 refills | Status: DC

## 2024-07-24 MED ORDER — LISINOPRIL 10 MG PO TABS: 10.0000 mg | ORAL_TABLET | Freq: Every day | ORAL | 1 refills | Status: AC

## 2024-07-24 MED ORDER — BLOOD GLUCOSE TEST VI STRP: 1.0000 | ORAL_STRIP | Freq: Three times a day (TID) | 5 refills | Status: AC

## 2024-07-24 MED ORDER — PEN NEEDLES 32G X 4 MM MISC: 2 refills | Status: AC

## 2024-07-24 MED ORDER — METFORMIN HCL ER 500 MG PO TB24: 500.0000 mg | ORAL_TABLET | Freq: Every day | ORAL | 1 refills | Status: AC

## 2024-07-24 MED ORDER — FLUTICASONE PROPIONATE 50 MCG/ACT NA SUSP: 2.0000 | Freq: Every day | NASAL | 6 refills | Status: AC

## 2024-07-24 MED ORDER — LANCETS MISC: 1.0000 | 2 refills | Status: DC

## 2024-07-24 MED ORDER — LANCETS MISC: 1.0000 | 2 refills | Status: AC

## 2024-07-24 NOTE — Telephone Encounter (Signed)
 Gerard Damien NOVAK, FNP      07/24/24  3:20 PM I just refilled everything. Tell her to let me know if she has issues with this.

## 2024-07-24 NOTE — Telephone Encounter (Signed)
 Called pt and left vm instructing to call office back  Let pt know if its the wrong needles for insulin  pen or wrong strips to let us  know

## 2024-07-24 NOTE — Telephone Encounter (Signed)
 Copied from CRM #[id number]. Topic: Clinical - Medication Question >> Jul 24, 2024 12:19 PM Chasity T wrote: Reason for CRM: patient is wanting to be informed when Everitt Pringle sends in the test strips and other materials that was discussed during appt

## 2024-07-24 NOTE — Progress Notes (Signed)
 "  New Patient Office Visit  Subjective    Patient ID: Martha Lozano, female    DOB: 1961-06-01  Age: 64 y.o. MRN: [id number]  HPI Dechelle Attaway presents to establish care. Has a history of uncontrolled type 2 diabetes.  Is currently taking 10 units of Lantus .  Does monitor her sugars at home and says they range from 170 up to 225.  Says that diet is not good and she does indulge in increased carbs.  Does drink sugary beverages.  Does not currently exercise.  Says that she has had an eye exam in the last year.  Also reports that she is having some issues with her feet.  Reports that she is having calluses on her feet and is unsure of how to take care of her feet due to her diabetes.  Endorses redness on bilateral great toes and says that they swell at times.  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  Follows up with vascular surgery for this.  Next appointment with vascular surgery is 07/28/2024. Has a history of high blood pressure.  Does not take medications for this.  Denies blurred vision or headaches.  Does not have a blood pressure monitor at home.  Past Medical History:  Diagnosis Date   Breast cancer, stage 0, right    pt currently taking Tamoxifen    Diabetes mellitus without complication (HCC)    Family history of breast cancer    Family history of prostate cancer    Family history of skin cancer    Hep C w/o coma, chronic (HCC)    Hx of hepatitis C    pt received treatment for Hep C in 2018   Peripheral arterial disease    Sciatic pain     Past Surgical History:  Procedure Laterality Date   ABDOMINAL AORTOGRAM N/A 10/07/2023   Procedure: ABDOMINAL AORTOGRAM;  Surgeon: Sheree Penne Bruckner, MD;  Location: [facility] INVASIVE CV LAB;  Service: Cardiovascular;  Laterality: N/A;   ABDOMINAL AORTOGRAM W/LOWER EXTREMITY N/A 03/04/2023   Procedure: ABDOMINAL AORTOGRAM W/LOWER EXTREMITY;  Surgeon: Sheree Penne Bruckner, MD;   Location: [facility] INVASIVE CV LAB;  Service: Cardiovascular;  Laterality: N/A;   BREAST BIOPSY Right 09/12/2017   ENDARTERECTOMY FEMORAL Left 03/05/2023   Procedure: ILIOFEMORAL ENDARTERECTOMY;  Surgeon: Lanis Fonda BRAVO, MD;  Location: [facility] OR;  Service: Vascular;  Laterality: Left;   ENDARTERECTOMY FEMORAL Left 10/21/2023   Procedure: LEFT ILIO - FEMORAL ENDARTARECTOMY;  Surgeon: Lanis Fonda BRAVO, MD;  Location: [facility] OR;  Service: Vascular;  Laterality: Left;   FEMORAL-POPLITEAL BYPASS GRAFT Left 03/05/2023   Procedure: LEFT FEMORAL-ABOVE KNEE POPLITEAL ARTERY BYPASS GRAFT USING PROPATEN RING GRAFT X 80CM;  Surgeon: Lanis Fonda BRAVO, MD;  Location: [facility] OR;  Service: Vascular;  Laterality: Left;   FEMORAL-POPLITEAL BYPASS GRAFT Left 10/21/2023   Procedure: REDO LEFT FEMORAL-BELOW KNEE IPSITATERAL SAPHENOUS BYPASS WITH GREATER SAPHENOUS VEIN HARVEST;  Surgeon: Lanis Fonda BRAVO, MD;  Location: [facility] OR;  Service: Vascular;  Laterality: Left;   INCISION AND DRAINAGE ABSCESS Left    hand   LOWER EXTREMITY ANGIOGRAPHY N/A 10/07/2023   Procedure: Lower Extremity Angiography;  Surgeon: Sheree Penne Bruckner, MD;  Location: [facility] INVASIVE CV LAB;  Service: Cardiovascular;  Laterality: N/A;    Family History  Problem Relation Age of Onset   Diabetes Sister    Hypertension Sister    Breast cancer Sister 50   Diabetes Brother    Breast cancer  Maternal Grandmother 11   Cervical cancer Mother    Prostate cancer Father 39       metastatic to bones   Cancer Maternal Aunt        type unk dx <50   Skin cancer Paternal Uncle    Pulmonary embolism Maternal Grandfather 29   Cancer Paternal Uncle    Breast cancer Other    Stomach cancer Other     Social History   Socioeconomic History   Marital status: Widowed    Spouse name: Not on file   Number of children: Not on file   Years of education: Not on file   Highest education level: Not on file  Occupational History   Not on file  Tobacco Use   Smoking  status: Former    Current packs/day: 0.33    Average packs/day: 0.3 packs/day for 35.0 years (11.6 ttl pk-yrs)    Types: Cigarettes   Smokeless tobacco: Never  Vaping Use   Vaping status: Every Day   Substances: Nicotine  Substance and Sexual Activity   Alcohol use: Not Currently    Comment: Occasional   Drug use: Not Currently    Comment: history substance abuse    Sexual activity: Yes    Birth control/protection: None  Other Topics Concern   Not on file  Social History Narrative   Not on file   Social Drivers of Health   Tobacco Use: Medium Risk (07/14/2024)   Patient History    Smoking Tobacco Use: Former    Smokeless Tobacco Use: Never    Passive Exposure: Not on file  Financial Resource Strain: Medium Risk (02/20/2024)   Received from [facility]   Overall Financial Resource Strain (CARDIA)    How hard is it for you to pay for the very basics like food, housing, medical care, and heating?: Somewhat hard  Food Insecurity: No Food Insecurity (10/29/2023)   Hunger Vital Sign    Worried About Running Out of Food in the Last Year: Never true    Ran Out of Food in the Last Year: Never true  Transportation Needs: No Transportation Needs (10/29/2023)   PRAPARE - Administrator, Civil Service (Medical): No    Lack of Transportation (Non-Medical): No  Physical Activity: Inactive (02/20/2024)   Received from [facility]   Exercise Vital Sign    On average, how many days per week do you engage in moderate to strenuous exercise (like a brisk walk)?: 0 days    On average, how many minutes do you engage in exercise at this level?: 0 min  Stress: No Stress Concern Present (02/20/2024)   Received from [facility] of Occupational Health - Occupational Stress Questionnaire    Do you feel stress - tense, restless, nervous, or anxious, or unable to sleep at night because your mind is troubled all the time - these days?: Only a little  Social  Connections: Moderately Integrated (04/19/2023)   Received from [facility]   Social Connection and Isolation Panel    In a typical week, how many times do you talk on the phone with family, friends, or neighbors?: More than three times a week    How often do you get together with friends or relatives?: Three times a week    How often do you attend church or religious services?: More than 4 times per year    Do you belong to any clubs or organizations such  as church groups, unions, fraternal or athletic groups, or school groups?: No    How often do you attend meetings of the clubs or organizations you belong to?: 1 to 4 times per year    Are you married, widowed, divorced, separated, never married, or living with a partner?: Widowed  Intimate Partner Violence: Not At Risk (10/29/2023)   Humiliation, Afraid, Rape, and Kick questionnaire    Fear of Current or Ex-Partner: No    Emotionally Abused: No    Physically Abused: No    Sexually Abused: No  Depression (PHQ2-9): Low Risk (07/24/2024)   Depression (PHQ2-9)    PHQ-2 Score: 1  Alcohol Screen: Not on file  Housing: Low Risk (10/29/2023)   Housing Stability Vital Sign    Unable to Pay for Housing in the Last Year: No    Number of Times Moved in the Last Year: 0    Homeless in the Last Year: No  Utilities: Not At Risk (10/29/2023)   [employer] Utilities    Threatened with loss of utilities: No  Health Literacy: Low Risk (02/20/2024)   Received from [facility] Literacy    How often do you need to have someone help you when you read instructions, pamphlets, or other written material from your doctor or pharmacy?: Never   Review of Systems  Constitutional:  Negative for chills, fatigue and fever.  Eyes:  Negative for visual disturbance.  Respiratory:  Negative for cough, chest tightness, shortness of breath and wheezing.   Cardiovascular:  Negative for chest pain.  Gastrointestinal:  Negative for abdominal pain.  Endocrine:  Negative for polydipsia, polyphagia and polyuria.  Genitourinary:  Negative for difficulty urinating.  Skin:  Positive for wound.  Neurological:  Negative for headaches.   Objective    Today's Vitals   07/24/24 1029 07/24/24 1040  BP: (!) 166/92 (!) 162/84  Temp: 98.1 F (36.7 C)   SpO2: 93%   Weight: 100 lb 6.4 oz (45.5 kg)    Body mass index is 19.61 kg/m.   Physical Exam Vitals and nursing note reviewed.  Constitutional:      General: She is not in acute distress.    Appearance: Normal appearance. She is not ill-appearing.  HENT:     Right Ear: Tympanic membrane is retracted. Tympanic membrane is not erythematous or bulging.     Left Ear: Tympanic membrane is retracted. Tympanic membrane is not erythematous or bulging.  Cardiovascular:     Rate and Rhythm: Normal rate and regular rhythm.     Pulses:          Dorsalis pedis pulses are 1+ on the right side and 1+ on the left side.     Heart sounds: Normal heart sounds, S1 normal and S2 normal. No murmur heard. Pulmonary:     Effort: Pulmonary effort is normal. No respiratory distress.     Breath sounds: Normal breath sounds. No wheezing.  Skin:    Capillary Refill: Capillary refill takes more than 3 seconds.     Comments:  Callus noted on the plantar aspect of the ball of the right foot.  Depth difficult to assess.  Skin intact without ulceration, drainage, bleeding, erythema, warmth, or signs of infection.  Sensation intact to light touch bilaterally.  Dorsalis pedis pulses 1+ bilaterally.  Capillary refill delayed in bilateral great toes.  Linear fissure noted on the plantar surface of the left great toe without bleeding, drainage, surrounding erythema, or signs of infection.  Neurological:  Mental Status: She is alert.  Psychiatric:        Mood and Affect: Mood normal.        Behavior: Behavior normal.        Thought Content: Thought content normal.        Judgment: Judgment normal.       07/24/2024   10:52 AM   Depression screen PHQ 2/9  Decreased Interest 0  Down, Depressed, Hopeless 0  PHQ - 2 Score 0  Altered sleeping 1  Tired, decreased energy 0  Change in appetite 0  Feeling bad or failure about yourself  0  Trouble concentrating 0  Moving slowly or fidgety/restless 0  Suicidal thoughts 0  PHQ-9 Score 1  Difficult doing work/chores Not difficult at all      07/24/2024   10:52 AM  GAD 7 : Generalized Anxiety Score  Nervous, Anxious, on Edge 1  Control/stop worrying 1  Worry too much - different things 0  Trouble relaxing 0  Restless 0  Easily annoyed or irritable 0  Afraid - awful might happen 0  Total GAD 7 Score 2  Anxiety Difficulty Somewhat difficult   Diabetic Foot Exam - Simple   Simple Foot Form Visual Inspection See comments: Yes Sensation Testing Intact to touch and monofilament testing bilaterally: Yes Pulse Check Posterior Tibialis and Dorsalis pulse intact bilaterally: Yes Comments Sensation intact.  Callus noted on the plantar aspect of the ball of the right foot.  Depth difficult to assess.  Skin intact without ulceration, drainage, bleeding, erythema, warmth, or signs of infection.  Sensation intact to light touch bilaterally.  Dorsalis pedis pulses 1+ bilaterally.  Capillary refill delayed in bilateral great toes.  Linear fissure noted on the plantar surface of the left great toe without bleeding, drainage, surrounding erythema, or signs of infection.    Assessment & Plan:  1. Uncontrolled type 2 diabetes mellitus with hyperglycemia, with long-term current use of insulin  (HCC) (Primary) -Educated patient to avoid foods and beverages high in carbohydrates and sugar. Advised patient to increase walking as tolerated to 150 minutes per week to help with the management of diabetes. -Advised patient to wear supportive footwear and check feet regularly for diabetic foot ulcers. Referred to podiatry for foot ulcer.  -Recommended patient to get dilated eye exam results  faxed to our office. -Patient advised to monitor blood glucose 3 times daily after meals, and at bedtime. Blood glucose log provided. Target blood glucose goal discussed (<150 mg/dL). Patient instructed to notify the clinic if readings are consistently >150 mg/dL. May need to increase Lantus  at this time.  Would like to get baseline diabetic labs at this time. Will call patient to discuss results.  -Would like for her to start on metformin  to further assist with blood sugar control.  - Hemoglobin A1c - Microalbumin / creatinine urine ratio - AMB Referral [facility] Care Management - metFORMIN  (GLUCOPHAGE -XR) 500 MG 24 hr tablet; Take 1 tablet (500 mg total) by mouth daily with breakfast.  Dispense: 30 tablet; Refill: 1  2. Essential hypertension -Would like to start patient on lisinopril  at this time. Educated patient on appropriate use and potential side effects.   -Patient educated on DASH-style diet, sodium restriction, and lifestyle modifications to support blood pressure control. Patient verbalized understanding, and blood pressure will continue to be monitored at home. Patient given blood pressure log and blood pressure monitor prescription. Gave target blood pressure goals to be less than 140/90.  Advised patient that if blood pressure  decreases below 100 systolic or she experiences dizziness to notify the office. - CMP14+EGFR - lisinopril  (ZESTRIL ) 10 MG tablet; Take 1 tablet (10 mg total) by mouth daily.  Dispense: 30 tablet; Refill: 1  3. Hypertriglyceridemia - Lipid panel  4. Screening for deficiency anemia - CBC with Differential  5. Diabetic ulcer of right foot associated with diabetes mellitus due to underlying condition, limited to breakdown of skin, unspecified part of foot (HCC) -Recommending referral to podiatry at this time.  Advised not to trim calluses or nails independently and to avoid over-the-counter corn/callus removers.  Encouraged to wear proper footwear and avoid barefoot  walking.  Will monitor for signs of any infection regarding calluses on her feet.  Patient to follow-up with podiatry. - Ambulatory referral to Podiatry  6. Retracted drums, bilateral -Instructed on the use of Flonase . Patient agrees - fluticasone  (FLONASE ) 50 MCG/ACT nasal spray; Place 2 sprays into both nostrils daily.  Dispense: 16 g; Refill: 6    Return in about 4 weeks (around 08/21/2024).   Damien KATHEE Pringle, FNP  "

## 2024-07-27 ENCOUNTER — Telehealth: Payer: Self-pay

## 2024-07-27 ENCOUNTER — Other Ambulatory Visit: Payer: Self-pay

## 2024-07-27 ENCOUNTER — Ambulatory Visit: Payer: Self-pay

## 2024-07-27 DIAGNOSIS — E08621 Diabetes mellitus due to underlying condition with foot ulcer: Secondary | ICD-10-CM

## 2024-07-27 LAB — CMP14+EGFR
ALT: 23 IU/L (ref 0–32)
AST: 25 IU/L (ref 0–40)
Albumin: 4.7 g/dL (ref 3.9–4.9)
Alkaline Phosphatase: 159 IU/L — ABNORMAL HIGH (ref 49–135)
BUN/Creatinine Ratio: 16 (ref 12–28)
BUN: 12 mg/dL (ref 8–27)
Bilirubin Total: 0.3 mg/dL (ref 0.0–1.2)
CO2: 26 mmol/L (ref 20–29)
Calcium: 9.9 mg/dL (ref 8.7–10.3)
Chloride: 92 mmol/L — ABNORMAL LOW (ref 96–106)
Creatinine, Ser: 0.77 mg/dL (ref 0.57–1.00)
Globulin, Total: 2.8 g/dL (ref 1.5–4.5)
Glucose: 484 mg/dL — ABNORMAL HIGH (ref 70–99)
Potassium: 4.8 mmol/L (ref 3.5–5.2)
Sodium: 134 mmol/L (ref 134–144)
Total Protein: 7.5 g/dL (ref 6.0–8.5)
eGFR: 87 mL/min/1.73

## 2024-07-27 LAB — LIPID PANEL
Chol/HDL Ratio: 2.4 ratio (ref 0.0–4.4)
Cholesterol, Total: 203 mg/dL — ABNORMAL HIGH (ref 100–199)
HDL: 84 mg/dL
LDL Chol Calc (NIH): 102 mg/dL — ABNORMAL HIGH (ref 0–99)
Triglycerides: 100 mg/dL (ref 0–149)
VLDL Cholesterol Cal: 17 mg/dL (ref 5–40)

## 2024-07-27 LAB — CBC WITH DIFFERENTIAL/PLATELET
Basophils Absolute: 0.1 x10E3/uL (ref 0.0–0.2)
Basos: 1 %
EOS (ABSOLUTE): 0.1 x10E3/uL (ref 0.0–0.4)
Eos: 2 %
Hematocrit: 47.5 % — ABNORMAL HIGH (ref 34.0–46.6)
Hemoglobin: 15.6 g/dL (ref 11.1–15.9)
Immature Grans (Abs): 0 x10E3/uL (ref 0.0–0.1)
Immature Granulocytes: 0 %
Lymphocytes Absolute: 0.9 x10E3/uL (ref 0.7–3.1)
Lymphs: 18 %
MCH: 29.9 pg (ref 26.6–33.0)
MCHC: 32.8 g/dL (ref 31.5–35.7)
MCV: 91 fL (ref 79–97)
Monocytes Absolute: 0.5 x10E3/uL (ref 0.1–0.9)
Monocytes: 11 %
Neutrophils Absolute: 3.5 x10E3/uL (ref 1.4–7.0)
Neutrophils: 68 %
Platelets: 194 x10E3/uL (ref 150–450)
RBC: 5.21 x10E6/uL (ref 3.77–5.28)
RDW: 12.1 % (ref 11.7–15.4)
WBC: 5.1 x10E3/uL (ref 3.4–10.8)

## 2024-07-27 LAB — MICROALBUMIN / CREATININE URINE RATIO
Creatinine, Urine: 24.4 mg/dL
Microalb/Creat Ratio: 130 mg/g{creat} — ABNORMAL HIGH (ref 0–29)
Microalbumin, Urine: 31.7 ug/mL

## 2024-07-27 LAB — HEMOGLOBIN A1C
Est. average glucose Bld gHb Est-mCnc: 355 mg/dL
Hgb A1c MFr Bld: 14 % — AB (ref 4.8–5.6)

## 2024-07-27 MED ORDER — INSULIN GLARGINE 100 UNIT/ML ~~LOC~~ SOLN: 10.0000 [IU] | Freq: Every day | SUBCUTANEOUS | 0 refills | Status: DC

## 2024-07-27 NOTE — Telephone Encounter (Signed)
 Called pt to give call back

## 2024-07-27 NOTE — Progress Notes (Signed)
 Called pt and let vm instructing to call back for results on labs

## 2024-07-27 NOTE — Progress Notes (Unsigned)
 Care Guide Pharmacy Note  07/27/2024 Name: Martha Lozano MRN: [id number] DOB: [birth year]  Referred By: Gerard Damien NOVAK, FNP Reason for referral: Complex Care Management (Outreach to schedule with Pharm d )   Martha Lozano is a 64 y.o. year old female who is a primary care patient of Gerard Damien NOVAK, FNP.  Martha Lozano was referred to the pharmacist for assistance related to: DMII  An unsuccessful telephone outreach was attempted today to contact the patient who was referred to the pharmacy team for assistance with medication assistance. Additional attempts will be made to contact the patient.  Jeoffrey Buffalo , RMA     [facility]  [facility], [facility] Guide  Direct Dial: [phone]  Website: [email or web address]

## 2024-07-27 NOTE — Progress Notes (Signed)
 Hi,   Can you please call this patient and discuss her labs.  Her blood sugar was 484.  Confirm that she is taking her Lantus .  She told me in the office that she was taking 10 units of insulin  daily.  I would like to titrate her Lantus  up 1 unit/day until her fasting blood glucose is less than 150.  Her hemoglobin A1c was 14.  I did start her on metformin . Make sure that she has started this.  I would like to add another diabetic medication.  But I would like to get her sugars under control with Lantus  first.  She was also spilling protein in her urine, which is [place] a result of her uncontrolled diabetes.  Kidney and liver function was normal.  Cholesterol was mildly elevated but we will work on this after we get her sugars under control.  Let me know if she has any questions.  Thanks, Damien

## 2024-07-27 NOTE — Telephone Encounter (Signed)
 Prescription Request  07/27/2024  LOV: Visit date not found  What is the name of the medication or equipment?                 insulin  glargine (LANTUS ) 100 UNIT/ML injection Inject 10 Units into the skin at bedtime.   -- -- [provider]           []                   Have you contacted your pharmacy to request a refill? Yes   Which pharmacy would you like this sent to?    [facility]   Patient notified that their request is being sent to the clinical staff for review and that they should receive a response within 2 business days.   Please advise at Mobile [phone] (mobile)

## 2024-07-27 NOTE — Progress Notes (Signed)
 Patient was previously referred to [facility].  Was faxed a letter saying that they are not taking her insurance at this time.  Put in another podiatry referral at this time.   Orders Placed This Encounter  Procedures   Ambulatory referral to Podiatry    Referral Priority:   Routine    Referral Type:   Consultation    Referral Reason:   Specialty Services Required    Requested Specialty:   Podiatry    Number of Visits Requested:   1

## 2024-07-28 NOTE — Progress Notes (Signed)
 Left pt voicemail

## 2024-07-29 ENCOUNTER — Ambulatory Visit (HOSPITAL_COMMUNITY)

## 2024-07-29 NOTE — Progress Notes (Signed)
 Care Guide Pharmacy Note  07/29/2024 Name: Martha Lozano MRN: [id number] DOB: May 08, 1961  Referred By: Gerard Damien NOVAK, FNP Reason for referral: Complex Care Management (Outreach to schedule with Pharm d )   Martha Lozano is a 64 y.o. year old female who is a primary care patient of Gerard Damien NOVAK, FNP.  Martha Lozano was referred to the pharmacist for assistance related to: DMII  Successful contact was made with the patient to discuss pharmacy services including being ready for the pharmacist to call at least 5 minutes before the scheduled appointment time and to have medication bottles and any blood pressure readings ready for review. The patient agreed to meet with the pharmacist via telephone visit on (date/time).08/03/2024  Jeoffrey Buffalo , RMA     [place]  [facility], [facility] Guide  Direct Dial: [phone]  Website: [email or web address]

## 2024-07-30 ENCOUNTER — Ambulatory Visit

## 2024-07-31 ENCOUNTER — Ambulatory Visit (HOSPITAL_COMMUNITY)
Admission: RE | Admit: 2024-07-31 | Discharge: 2024-07-31 | Disposition: A | Discharge status: Home / Self Care | Source: Ambulatory Visit | Attending: Physician Assistant | Admitting: Physician Assistant

## 2024-07-31 DIAGNOSIS — E785 Hyperlipidemia, unspecified: Secondary | ICD-10-CM | POA: Insufficient documentation

## 2024-07-31 DIAGNOSIS — I739 Peripheral vascular disease, unspecified: Secondary | ICD-10-CM

## 2024-07-31 DIAGNOSIS — I10 Essential (primary) hypertension: Secondary | ICD-10-CM | POA: Insufficient documentation

## 2024-07-31 DIAGNOSIS — F17200 Nicotine dependence, unspecified, uncomplicated: Secondary | ICD-10-CM | POA: Insufficient documentation

## 2024-07-31 DIAGNOSIS — I70221 Atherosclerosis of native arteries of extremities with rest pain, right leg: Secondary | ICD-10-CM | POA: Diagnosis present

## 2024-07-31 DIAGNOSIS — E1151 Type 2 diabetes mellitus with diabetic peripheral angiopathy without gangrene: Secondary | ICD-10-CM | POA: Insufficient documentation

## 2024-07-31 LAB — VAS US ABI WITH/WO TBI
Left ABI: 1
Right ABI: 0.67

## 2024-07-31 NOTE — Telephone Encounter (Signed)
 Pt states has already received her strips

## 2024-08-03 ENCOUNTER — Telehealth: Payer: Self-pay

## 2024-08-03 ENCOUNTER — Other Ambulatory Visit

## 2024-08-03 ENCOUNTER — Ambulatory Visit: Attending: Physician Assistant | Admitting: Physician Assistant

## 2024-08-03 VITALS — BP 127/79 | Ht 60.0 in | Wt 100.4 lb

## 2024-08-03 DIAGNOSIS — I739 Peripheral vascular disease, unspecified: Secondary | ICD-10-CM | POA: Diagnosis not present

## 2024-08-03 NOTE — Telephone Encounter (Signed)
 Copied from CRM [id number]. Topic: Appointments - Scheduling Inquiry for Clinic >> Aug 03, 2024 11:17 AM Corin V wrote: Reason for CRM: Patient missed the pharmacy outreach call. Called [facility] and did not get an answer. Please call patient back to reschedule and advise which meds this is for as she was uncertain.

## 2024-08-04 ENCOUNTER — Ambulatory Visit

## 2024-08-05 ENCOUNTER — Encounter: Payer: Self-pay | Admitting: Physician Assistant

## 2024-08-05 ENCOUNTER — Other Ambulatory Visit: Payer: Self-pay

## 2024-08-05 DIAGNOSIS — I739 Peripheral vascular disease, unspecified: Secondary | ICD-10-CM

## 2024-08-05 NOTE — Progress Notes (Signed)
 " Office Note     CC:  follow up Requesting Provider:  Gerard Damien NOVAK, FNP  HPI: Martha Lozano is a 64 y.o. (08-12-1960) female who presents to clinic as an urgent triage visit due to concern of right great toe.  She has history of a left iliofemoral endarterectomy and left femoral to above-knee popliteal bypass with PTFE in August 2024.  His bypass thrombosed however she was able to heal her wounds at the time.  Unfortunately she developed new critical limb ischemia with wounds and underwent redo iliofemoral endarterectomy and left femoral to below the knee popliteal bypass with vein on 10/21/2023.  Her wounds have since healed. She has a known right flush SFA occlusion  She reports several week history of right great toe pain.  She states the great toenail goes through cycles of turning dark and then falling off.  She denies any pain that keeps her up at night.  She denies any claudication in her right calf.  She is on a daily aspirin .  She is a smoker.  She is here today with her sister.   Past Medical History:  Diagnosis Date   Breast cancer, stage 0, right    pt currently taking Tamoxifen    Diabetes mellitus without complication (HCC)    Family history of breast cancer    Family history of prostate cancer    Family history of skin cancer    Hep C w/o coma, chronic (HCC)    Hx of hepatitis C    pt received treatment for Hep C in 2018   Peripheral arterial disease    Sciatic pain     Past Surgical History:  Procedure Laterality Date   ABDOMINAL AORTOGRAM N/A 10/07/2023   Procedure: ABDOMINAL AORTOGRAM;  Surgeon: Sheree Penne Bruckner, MD;  Location: [facility] INVASIVE CV LAB;  Service: Cardiovascular;  Laterality: N/A;   ABDOMINAL AORTOGRAM W/LOWER EXTREMITY N/A 03/04/2023   Procedure: ABDOMINAL AORTOGRAM W/LOWER EXTREMITY;  Surgeon: Sheree Penne Bruckner, MD;  Location: [facility] INVASIVE CV LAB;  Service: Cardiovascular;  Laterality: N/A;   BREAST BIOPSY Right 09/12/2017    ENDARTERECTOMY FEMORAL Left 03/05/2023   Procedure: ILIOFEMORAL ENDARTERECTOMY;  Surgeon: Lanis Fonda BRAVO, MD;  Location: [facility] OR;  Service: Vascular;  Laterality: Left;   ENDARTERECTOMY FEMORAL Left 10/21/2023   Procedure: LEFT ILIO - FEMORAL ENDARTARECTOMY;  Surgeon: Lanis Fonda BRAVO, MD;  Location: [facility] OR;  Service: Vascular;  Laterality: Left;   FEMORAL-POPLITEAL BYPASS GRAFT Left 03/05/2023   Procedure: LEFT FEMORAL-ABOVE KNEE POPLITEAL ARTERY BYPASS GRAFT USING PROPATEN RING GRAFT X 80CM;  Surgeon: Lanis Fonda BRAVO, MD;  Location: [facility] OR;  Service: Vascular;  Laterality: Left;   FEMORAL-POPLITEAL BYPASS GRAFT Left 10/21/2023   Procedure: REDO LEFT FEMORAL-BELOW KNEE IPSITATERAL SAPHENOUS BYPASS WITH GREATER SAPHENOUS VEIN HARVEST;  Surgeon: Lanis Fonda BRAVO, MD;  Location: [facility] OR;  Service: Vascular;  Laterality: Left;   INCISION AND DRAINAGE ABSCESS Left    hand   LOWER EXTREMITY ANGIOGRAPHY N/A 10/07/2023   Procedure: Lower Extremity Angiography;  Surgeon: Sheree Penne Bruckner, MD;  Location: [facility] INVASIVE CV LAB;  Service: Cardiovascular;  Laterality: N/A;    Social History   Socioeconomic History   Marital status: Widowed    Spouse name: Not on file   Number of children: Not on file   Years of education: Not on file   Highest education level: Not on file  Occupational History   Not on file  Tobacco Use   Smoking status: Former  Current packs/day: 0.33    Average packs/day: 0.3 packs/day for 35.0 years (11.6 ttl pk-yrs)    Types: Cigarettes   Smokeless tobacco: Never  Vaping Use   Vaping status: Every Day   Substances: Nicotine  Substance and Sexual Activity   Alcohol use: Not Currently    Comment: Occasional   Drug use: Not Currently    Comment: history substance abuse    Sexual activity: Yes    Birth control/protection: None  Other Topics Concern   Not on file  Social History Narrative   Not on file   Social Drivers of Health   Tobacco Use: Medium Risk  (08/05/2024)   Patient History    Smoking Tobacco Use: Former    Smokeless Tobacco Use: Never    Passive Exposure: Not on file  Financial Resource Strain: Medium Risk (02/20/2024)   Received from [facility]   Overall Financial Resource Strain (CARDIA)    How hard is it for you to pay for the very basics like food, housing, medical care, and heating?: Somewhat hard  Food Insecurity: No Food Insecurity (10/29/2023)   Hunger Vital Sign    Worried About Running Out of Food in the Last Year: Never true    Ran Out of Food in the Last Year: Never true  Transportation Needs: No Transportation Needs (10/29/2023)   PRAPARE - Administrator, Civil Service (Medical): No    Lack of Transportation (Non-Medical): No  Physical Activity: Inactive (02/20/2024)   Received from [facility]   Exercise Vital Sign    On average, how many days per week do you engage in moderate to strenuous exercise (like a brisk walk)?: 0 days    On average, how many minutes do you engage in exercise at this level?: 0 min  Stress: No Stress Concern Present (02/20/2024)   Received from [facility] - Occupational Stress Questionnaire    Do you feel stress - tense, restless, nervous, or anxious, or unable to sleep at night because your mind is troubled all the time - these days?: Only a little  Social Connections: Moderately Integrated (04/19/2023)   Received from [facility]   Social Connection and Isolation Panel    In a typical week, how many times do you talk on the phone with family, friends, or neighbors?: More than three times a week    How often do you get together with friends or relatives?: Three times a week    How often do you attend church or religious services?: More than 4 times per year    Do you belong to any clubs or organizations such as church groups, unions, fraternal or athletic groups, or school groups?: No    How often do you attend meetings  of the clubs or organizations you belong to?: 1 to 4 times per year    Are you married, widowed, divorced, separated, never married, or living with a partner?: Widowed  Intimate Partner Violence: Not At Risk (10/29/2023)   Humiliation, Afraid, Rape, and Kick questionnaire    Fear of Current or Ex-Partner: No    Emotionally Abused: No    Physically Abused: No    Sexually Abused: No  Depression (PHQ2-9): Low Risk (07/24/2024)   Depression (PHQ2-9)    PHQ-2 Score: 1  Alcohol Screen: Not on file  Housing: Low Risk (10/29/2023)   Housing Stability Vital Sign    Unable to Pay for Housing in  the Last Year: No    Number of Times Moved in the Last Year: 0    Homeless in the Last Year: No  Utilities: Not At Risk (10/29/2023)   [facility] Utilities    Threatened with loss of utilities: No  Health Literacy: Low Risk (02/20/2024)   Received from [facility]    How often do you need to have someone help you when you read instructions, pamphlets, or other written material from your doctor or pharmacy?: Never    Family History  Problem Relation Age of Onset   Diabetes Sister    Hypertension Sister    Breast cancer Sister 49   Diabetes Brother    Breast cancer Maternal Grandmother 87   Cervical cancer Mother    Prostate cancer Father 32       metastatic to bones   Cancer Maternal Aunt        type unk dx <50   Skin cancer Paternal Uncle    Pulmonary embolism Maternal Grandfather 47   Cancer Paternal Uncle    Breast cancer Other    Stomach cancer Other     Current Outpatient Medications  Medication Sig Dispense Refill   aspirin  EC 81 MG tablet Take 1 tablet (81 mg total) by mouth daily. Swallow whole. 30 tablet 0   Buprenorphine  HCl-Naloxone  HCl (SUBOXONE ) 12-3 MG FILM Place 1 Film under the tongue 3 (three) times daily.     fluticasone  (FLONASE ) 50 MCG/ACT nasal spray Place 2 sprays into both nostrils daily. 16 g 6   gabapentin  (NEURONTIN ) 800 MG tablet Take 800 mg by mouth 3  (three) times daily.     Glucose Blood (BLOOD GLUCOSE TEST STRIPS) STRP 1 each by In Vitro route in the morning, at noon, and at bedtime. May substitute to any manufacturer covered by patient's insurance. 100 strip 5   HYDROcodone -acetaminophen  (NORCO/VICODIN) 5-325 MG tablet Take 1 tablet by mouth every 6 (six) hours as needed for moderate pain (pain score 4-6). 20 tablet 0   insulin  glargine (LANTUS ) 100 UNIT/ML injection Inject 0.1 mLs (10 Units total) into the skin at bedtime. 10 mL 0   Insulin  Pen Needle (PEN NEEDLES) 32G X 4 MM MISC Use one needle subcutaneously once daily with Lantus  insulin  pen. 30 each 2   Lancets MISC 1 each by Does not apply route as directed. Dispense based on patient and insurance preference. Use up to four times daily as directed. (FOR ICD-10 E10.9, E11.9). 100 each 2   lisinopril  (ZESTRIL ) 10 MG tablet Take 1 tablet (10 mg total) by mouth daily. 30 tablet 1   metFORMIN  (GLUCOPHAGE -XR) 500 MG 24 hr tablet Take 1 tablet (500 mg total) by mouth daily with breakfast. 30 tablet 1   Vitamin D , Ergocalciferol , (DRISDOL ) 1.25 MG (50000 UNIT) CAPS capsule Take 50,000 Units by mouth once a week.     No current facility-administered medications for this visit.    Allergies[1]   REVIEW OF SYSTEMS:  Negative unless noted in HPI [X]  denotes positive finding, [ ]  denotes negative finding Cardiac  Comments:  Chest pain or chest pressure:    Shortness of breath upon exertion:    Short of breath when lying flat:    Irregular heart rhythm:        Vascular    Pain in calf, thigh, or hip brought on by ambulation:    Pain in feet at night that wakes you up from your sleep:  Blood clot in your veins:    Leg swelling:         Pulmonary    Oxygen at home:    Productive cough:     Wheezing:         Neurologic    Sudden weakness in arms or legs:     Sudden numbness in arms or legs:     Sudden onset of difficulty speaking or slurred speech:    Temporary loss of vision  in one eye:     Problems with dizziness:         Gastrointestinal    Blood in stool:     Vomited blood:         Genitourinary    Burning when urinating:     Blood in urine:        Psychiatric    Major depression:         Hematologic    Bleeding problems:    Problems with blood clotting too easily:        Skin    Rashes or ulcers:        Constitutional    Fever or chills:      PHYSICAL EXAMINATION:  Vitals:   08/03/24 1359  BP: 127/79  Weight: 100 lb 6.4 oz (45.5 kg)  Height: 5' (1.524 m)    General:  WDWN in NAD; vital signs documented above Gait: Not observed HENT: WNL, normocephalic Pulmonary: normal non-labored breathing Cardiac: regular HR Abdomen: soft, NT, no masses Skin: without rashes Vascular Exam/Pulses: Palpable left DP; dopplerable right DP and PT Extremities: without ischemic changes, without Gangrene , without cellulitis; without open wounds;  Musculoskeletal: no muscle wasting or atrophy; callus formation right great toe tip and base of the first metatarsal but no open wounds  Neurologic: A&O X 3 Psychiatric:  The pt has Normal affect.   Non-Invasive Vascular Imaging:    Right lower extremity arterial duplex with occluded SFA  ABI/TBIToday's ABIToday's TBIPrevious ABIPrevious TBI  +-------+-----------+-----------+------------+------------+  Right 0.67       0.35       0.49        N/A           +-------+-----------+-----------+------------+------------+  Left  1.0        0.3        0.86        0.75          +-------+-----------+-----------+------------+------------+      ASSESSMENT/PLAN:: 64 y.o. female added to clinic as an urgent triage patient due to concern of right great toe discomfort  Ms. Dalal is a 64 year old female well-known to [facility] with left leg bypass surgery x 2.  She has a known right SFA occlusion at the origin.  She reports some discomfort in her right great toe and wanted to make sure she was okay.   She denies any claudication or rest pain of the right lower extremity.  On exam she has callus formation on her right great toe tip and the base of her first metatarsal on the plantar surface of her foot.  She does not have any open wounds or signs of infection.  Her ABI and TBI appear unchanged.  No indication for further workup at this time.  I asked her to notify our office if these callused areas develop open wounds or if she develops rest pain in the foot.  We will plan to repeat imaging in 3 months.  She will continue her aspirin .   Martha Lozano  Beth[facility] [phone]  Clinic MD:   [place]     [1] No Known Allergies  "

## 2024-08-07 ENCOUNTER — Other Ambulatory Visit: Payer: Self-pay

## 2024-08-10 ENCOUNTER — Telehealth: Payer: Self-pay | Admitting: *Deleted

## 2024-08-10 NOTE — Telephone Encounter (Unsigned)
 Copied from CRM [id number]. Topic: Clinical - Prescription Issue >> Aug 07, 2024  4:47 PM Nathanel BROCKS wrote: Reason for CRM: pt called and stated that she got the wrong prescription. She needs the Lantus  star pen. She got the vial of insulin  instead. Please send correct rx to pharmacy for pt.

## 2024-08-11 ENCOUNTER — Telehealth: Payer: Self-pay

## 2024-08-11 ENCOUNTER — Other Ambulatory Visit: Payer: Self-pay

## 2024-08-11 DIAGNOSIS — E1165 Type 2 diabetes mellitus with hyperglycemia: Secondary | ICD-10-CM

## 2024-08-11 MED ORDER — LANTUS SOLOSTAR 100 UNIT/ML ~~LOC~~ SOPN: 12.0000 [IU] | PEN_INJECTOR | Freq: Every day | SUBCUTANEOUS | 99 refills | Status: AC

## 2024-08-11 NOTE — Progress Notes (Unsigned)
 Complex Care Management Care Guide Note  08/11/2024 Name: Julyana Woolverton MRN: [id number] DOB: 12-Sep-1960  Libni Fusaro is a 64 y.o. year old female who is a primary care patient of Gerard Damien NOVAK, FNP and is actively engaged with the care management team. I reached out to Romero Gretel Rosa by phone today to assist with re-scheduling  with the Pharmacist.  Follow up plan: Unsuccessful telephone outreach attempt made. A HIPAA compliant phone message was left for the patient providing contact information and requesting a return call.  Jeoffrey Buffalo , RMA     [facility]  [facility], [facility] Guide  Direct Dial: [phone]  Website: [email or web address]

## 2024-08-13 NOTE — Progress Notes (Signed)
 Complex Care Management Care Guide Note  08/13/2024 Name: Martha Lozano MRN: [id number] DOB: [birth year]  Martha Lozano is a 64 y.o. year old female who is a primary care patient of Gerard Damien NOVAK, FNP and is actively engaged with the care management team. I reached out to Romero Gretel Rosa by phone today to assist with re-scheduling  with the Pharmacist.  Follow up plan: Telephone appointment with complex care management team member scheduled for:  08/31/2024  Martha Lozano , RMA     [place]  [facility], [facility] Guide  Direct Dial: [phone]  Website: [email or web address]

## 2024-08-21 ENCOUNTER — Ambulatory Visit

## 2024-08-27 ENCOUNTER — Ambulatory Visit

## 2024-08-31 ENCOUNTER — Other Ambulatory Visit

## 2024-10-26 ENCOUNTER — Ambulatory Visit (HOSPITAL_COMMUNITY)

## 2024-10-26 ENCOUNTER — Ambulatory Visit
